# Patient Record
Sex: Male | Born: 1945 | Race: White | Hispanic: No | Marital: Married | Smoking: Never smoker
Health system: Southern US, Community
[De-identification: ages and names within clinical notes are randomized; demographics above are authoritative.]

## PROBLEM LIST (undated history)

## (undated) DIAGNOSIS — F329 Major depressive disorder, single episode, unspecified: Secondary | ICD-10-CM

## (undated) DIAGNOSIS — Z87898 Personal history of other specified conditions: Secondary | ICD-10-CM

## (undated) DIAGNOSIS — K219 Gastro-esophageal reflux disease without esophagitis: Secondary | ICD-10-CM

## (undated) DIAGNOSIS — F32A Depression, unspecified: Secondary | ICD-10-CM

## (undated) DIAGNOSIS — L02415 Cutaneous abscess of right lower limb: Secondary | ICD-10-CM

## (undated) DIAGNOSIS — Z862 Personal history of diseases of the blood and blood-forming organs and certain disorders involving the immune mechanism: Secondary | ICD-10-CM

## (undated) DIAGNOSIS — I10 Essential (primary) hypertension: Secondary | ICD-10-CM

## (undated) DIAGNOSIS — Z8619 Personal history of other infectious and parasitic diseases: Secondary | ICD-10-CM

## (undated) DIAGNOSIS — K259 Gastric ulcer, unspecified as acute or chronic, without hemorrhage or perforation: Secondary | ICD-10-CM

## (undated) DIAGNOSIS — M549 Dorsalgia, unspecified: Secondary | ICD-10-CM

## (undated) DIAGNOSIS — M7700 Medial epicondylitis, unspecified elbow: Secondary | ICD-10-CM

## (undated) DIAGNOSIS — M4800 Spinal stenosis, site unspecified: Secondary | ICD-10-CM

## (undated) DIAGNOSIS — G629 Polyneuropathy, unspecified: Secondary | ICD-10-CM

## (undated) DIAGNOSIS — N200 Calculus of kidney: Secondary | ICD-10-CM

## (undated) DIAGNOSIS — Z87442 Personal history of urinary calculi: Secondary | ICD-10-CM

## (undated) DIAGNOSIS — R519 Headache, unspecified: Secondary | ICD-10-CM

## (undated) DIAGNOSIS — E785 Hyperlipidemia, unspecified: Secondary | ICD-10-CM

## (undated) DIAGNOSIS — M1990 Unspecified osteoarthritis, unspecified site: Secondary | ICD-10-CM

## (undated) DIAGNOSIS — G8929 Other chronic pain: Secondary | ICD-10-CM

## (undated) DIAGNOSIS — C4490 Unspecified malignant neoplasm of skin, unspecified: Secondary | ICD-10-CM

## (undated) DIAGNOSIS — M87051 Idiopathic aseptic necrosis of right femur: Secondary | ICD-10-CM

## (undated) HISTORY — PX: APPENDECTOMY: SHX54

## (undated) HISTORY — PX: MULTIPLE TOOTH EXTRACTIONS: SHX2053

## (undated) HISTORY — PX: CHOLECYSTECTOMY: SHX55

## (undated) HISTORY — PX: HEMORROIDECTOMY: SUR656

---
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
## 2007-05-07 ENCOUNTER — Ambulatory Visit: Payer: Self-pay | Admitting: Internal Medicine

## 2007-05-31 ENCOUNTER — Ambulatory Visit: Payer: Self-pay

## 2007-05-31 ENCOUNTER — Encounter: Payer: Self-pay | Admitting: Internal Medicine

## 2007-06-04 ENCOUNTER — Ambulatory Visit: Payer: Self-pay | Admitting: Internal Medicine

## 2007-06-04 LAB — CONVERTED CEMR LAB
AST: 32 units/L (ref 0–37)
Albumin: 4 g/dL (ref 3.5–5.2)
Bilirubin, Direct: 0.1 mg/dL (ref 0.0–0.3)
Cholesterol: 182 mg/dL (ref 0–200)
GFR calc Af Amer: 72 mL/min
GFR calc non Af Amer: 59 mL/min
Glucose, Bld: 96 mg/dL (ref 70–99)
HDL: 41.1 mg/dL (ref >39.0)
LDL Cholesterol: 111 mg/dL — ABNORMAL HIGH (ref 0–99)
Potassium: 4.5 meq/L (ref 3.5–5.1)
Sodium: 142 meq/L (ref 135–145)
Total CHOL/HDL Ratio: 4.4
Triglycerides: 150 mg/dL — ABNORMAL HIGH (ref 0–149)
VLDL: 30 mg/dL (ref 0–40)

## 2007-10-11 DIAGNOSIS — I2510 Atherosclerotic heart disease of native coronary artery without angina pectoris: Secondary | ICD-10-CM

## 2007-10-11 HISTORY — DX: Atherosclerotic heart disease of native coronary artery without angina pectoris: I25.10

## 2010-05-14 ENCOUNTER — Encounter: Payer: Self-pay | Admitting: Cardiology

## 2010-05-14 ENCOUNTER — Ambulatory Visit: Payer: Self-pay | Admitting: Cardiology

## 2010-05-24 DIAGNOSIS — F1010 Alcohol abuse, uncomplicated: Secondary | ICD-10-CM | POA: Insufficient documentation

## 2010-05-24 DIAGNOSIS — R55 Syncope and collapse: Secondary | ICD-10-CM | POA: Insufficient documentation

## 2010-05-24 DIAGNOSIS — R0989 Other specified symptoms and signs involving the circulatory and respiratory systems: Secondary | ICD-10-CM

## 2010-05-24 DIAGNOSIS — R0602 Shortness of breath: Secondary | ICD-10-CM

## 2010-05-24 DIAGNOSIS — R0609 Other forms of dyspnea: Secondary | ICD-10-CM

## 2010-05-25 ENCOUNTER — Ambulatory Visit: Payer: Self-pay | Admitting: Cardiology

## 2010-05-25 ENCOUNTER — Encounter (INDEPENDENT_AMBULATORY_CARE_PROVIDER_SITE_OTHER): Payer: Self-pay | Admitting: *Deleted

## 2010-05-27 ENCOUNTER — Encounter: Payer: Self-pay | Admitting: Cardiology

## 2010-06-02 ENCOUNTER — Encounter: Payer: Self-pay | Admitting: Cardiology

## 2010-06-02 ENCOUNTER — Ambulatory Visit: Payer: Self-pay | Admitting: Cardiology

## 2010-06-03 ENCOUNTER — Encounter: Payer: Self-pay | Admitting: Cardiology

## 2010-06-04 ENCOUNTER — Telehealth (INDEPENDENT_AMBULATORY_CARE_PROVIDER_SITE_OTHER): Payer: Self-pay | Admitting: *Deleted

## 2010-06-29 ENCOUNTER — Ambulatory Visit: Payer: Self-pay | Admitting: Cardiology

## 2010-11-09 NOTE — Assessment & Plan Note (Signed)
Summary: 1 MO F/U PER 8/16 OV-JM   Visit Type:  Follow-up Primary Hamza Empson:  [name] Agee, NP  CC:  near-syncope.  History of Present Illness: The patient presents for followup of dyspnea and presyncope. In the last visit I gave him some meclizine thinking that he was having some vertigo. In addition he had a BNP level which was normal. An event monitor demonstrated sinus rhythm. Stress testing demonstrated no evidence of ischemia or infarct. Following all this he took it upon himself to stop his Flomax, meclizine, Trilipix and hydrochlorothiazide. He said that since that time his symptoms have completely resolved. He is no longer having any presyncope or syncope. Is not having any palpitations. He said the dyspnea. He denies any PND or orthopnea. He said no chest discomfort.  Preventive Screening-Counseling & Management  Alcohol-Tobacco     Smoking Status: never  Current Medications (verified): 1)  Lotrel 5-20 Mg Caps (Amlodipine Besy-Benazepril Hcl) .... Take 1 Tablet By Mouth Once A Day 2)  Vitamin D (Ergocalciferol) 50000 Unit Caps (Ergocalciferol) .... Take One Tab Weekly 3)  Alprazolam 0.5 Mg Tabs (Alprazolam) .... Take 1 Tab By Mouth At Bedtime As Needed  Allergies (verified): No Known Drug Allergies  Comments:  Nurse/Medical Assistant: The patient is currently on medications but does not know the name or dosage at this time. Instructed to contact our office with details. Will update medication list at that time.  Past History:  Past Medical History: Reviewed history from 05/25/2010 and no changes required. Obesity.  Hypertension x years Alcohol abuse.  Heavy snoring.  Hyperlipidemia  Past Surgical History: Reviewed history from 05/25/2010 and no changes required. Appendectomy Hemorrhoidectomy  Review of Systems       As stated in the HPI and negative for all other systems.   Vital Signs:  Patient profile:   65 year old male Height:      72 inches Weight:       246 pounds Pulse rate:   105 / minute BP sitting:   144 / 91  (left arm)  Vitals Entered By: Carlye Grippe (June 29, 2010 12:50 PM)  Physical Exam  General:  Well developed, well nourished, in no acute distress. Head:  normocephalic and atraumatic Neck:  Neck supple, no JVD. No masses, thyromegaly or abnormal cervical nodes. Chest Wall:  no deformities or breast masses noted Lungs:  Clear bilaterally to auscultation and percussion. Heart:  Non-displaced PMI, chest non-tender; regular rate and rhythm, S1, S2 without murmurs, rubs or gallops. Carotid upstroke normal, no bruit. Normal abdominal aortic size, no bruits. Femorals normal pulses, no bruits. Pedals normal pulses. No edema, no varicosities. Abdomen:  Bowel sounds positive; abdomen soft and non-tender without masses, organomegaly, or hernias noted. No hepatosplenomegaly, obese Msk:  Back normal, normal gait. Muscle strength and tone normal. Extremities:  No clubbing or cyanosis. Neurologic:  Alert and oriented x 3. Skin:  Intact without lesions or rashes. Cervical Nodes:  no significant adenopathy Psych:  Normal affect.   Impression & Recommendations:  Problem # 1:  SYNCOPE AND COLLAPSE (ICD-780.2) The etiology of this is not clear but it has resolved. It may have been related to medications. No further cardiovascular testing is planned.  Problem # 2:  SHORTNESS OF BREATH (ICD-786.05) His dyspnea has improved. Testing was negative. If this returns I would suggest followup with his primary care perhaps pulmonary. Certainly he needs to change lifestyles as described in the previous note. However, at this time no further cardiovascular testing is  suggested.  Patient Instructions: 1)  No further cardiac work up needed.

## 2010-11-09 NOTE — Letter (Signed)
Summary: Pharmacist, community at [facility] [address]   [place], [place] [address]   Phone: [phone]  Fax: [phone]      [facility] Cardiovascular Services  Cardiolite Stress Test     [name] [facility]  Your doctor has ordered a Cardiolite Stress Test to help determine the condition of your heart during stress. If you take blood pressure medicine, ask your doctor if you should take it the day of your test. You should not have anything to eat or drink at least 4 hours before your test is scheduled, and no caffeine (coffee, tea, decaf. or chocolate) for 24 hours before your test.  You will need to register at the Outpatient/Main Entrance at the hospital 30 minutes before your appointment time. It is a good idea to bring a copy of your order with you. They will direct you to the Diagnostic Imaging (Radiology) Department.  You will be asked to undress from the waist up and be given a hospital gown to wear, so dress comfortably from the waist down, for example:    Sweat pants, shorts or skirt   Rubber-soled lace up shoes (i.e. tennis shoes)  Plan on about three hours from registration to release from the hospital.    Date of Test:              Time of Test              You may take all of your medications with water the morning of your test.

## 2010-11-09 NOTE — Procedures (Signed)
Summary: Holter and Event/ [facility] SERVICES  Holter and Event/ [facility] SERVICES   Imported By: Dorise Hiss 06/01/2010 08:35:22  _____________________________________________________________________  External Attachment:    Type:   Image     Comment:   External Document

## 2010-11-09 NOTE — Progress Notes (Signed)
Summary: STRESS TEST RESULTS  Phone Note Call from Patient Call back at [phone]   Reason for Call: Lab or Test Results Summary of Call: WOULD LIKE STRESS TEST RESULTS FROM 06/02/10 Initial call taken by: Dorise Hiss,  June 04, 2010 10:36 AM  Follow-up for Phone Call        Spoke with Lucendia Herrlich and she informed nurse that results hasn't been reviewed by MD yet,and results will be available at that time once reviewed by md next week. Follow-up by: Carlye Grippe,  June 04, 2010 4:13 PM  Additional Follow-up for Phone Call Additional follow up Details #1::        Left message to call back on machine. Cyril Loosen, RN, BSN  June 08, 2010 9:35 AM  Pt notified of results and verbalized understanding.  Cyril Loosen, RN, BSN  June 08, 2010 10:27 AM  Additional Follow-up by: Cyril Loosen, RN, BSN,  June 08, 2010 10:27 AM

## 2010-11-09 NOTE — Assessment & Plan Note (Signed)
Summary: ec6   Visit Type:  Initial Consult Primary Provider:  Belva Agee, NP  CC:  dizziness.  History of Present Illness: The patient presents for evaluation of dizziness. He has had this complaint for some years and was actually seen by [place] over 3 years ago. At that time he had an echocardiogram which was normal. It was not felt that his dizziness had a cardiac etiology. Since that time this has continued. He's dizzy with any change in position such as going from bending standing up but it also happens when he goes from sitting to lying down. His room begins to span. He does notice his heart rate going up which happens when he changes from my bent to an upright position. He is wearing an event monitor currently and sought recently go up to about 170 when he stood up from a bent position. He's also had episodes with his blood pressure dropping with one episode recorded at 60/40. He's had no frank syncope. He doesn't exercise but he does try to do some activities. He says he's been having increasing shortness of breath with mild activities and tiredness. He also has some cramping back pain that happens between his shoulder blades. He has not had chest pressure or neck discomfort. He doesn't have PND or orthopnea.  Preventive Screening-Counseling & Management  Alcohol-Tobacco     Smoking Status: never  Current Medications (verified): 1)  Lotrel 5-20 Mg Caps (Amlodipine Besy-Benazepril Hcl) .... Take 1 Tablet By Mouth Once A Day 2)  Trilipix 135 Mg Cpdr (Choline Fenofibrate) .... Take 1 Tablet By Mouth Once A Day 3)  Flomax 0.4 Mg Caps (Tamsulosin Hcl) .... Take 1 Tablet By Mouth Once A Day 4)  Vitamin D (Ergocalciferol) 50000 Unit Caps (Ergocalciferol) .... Take One Tab Weekly 5)  Hydrochlorothiazide 25 Mg Tabs (Hydrochlorothiazide) .... Take One Tablet By Mouth Daily. 6)  Alprazolam 0.5 Mg Tabs (Alprazolam) .... Take 1 Tab By Mouth At Bedtime As Needed  Allergies (verified): No Known Drug  Allergies  Past History:  Past Medical History: Obesity.  Hypertension x years Alcohol abuse.  Heavy snoring.  Hyperlipidemia  Past Surgical History: Appendectomy Hemorrhoidectomy  Family History:  Father died at 108 due to old age.  Mother died at 68   due to cancer.  He has a sister who is alive and well at 90 and a   brother who is alive at 6.  There is no family history of premature   coronary artery disease.   Social History: Retired -former Soil scientist Alcohol Use - yes Married with one child Never smoked Smoking Status:  never  Review of Systems       As stated in the HPI and negative for all other systems.   Vital Signs:  Patient profile:   65 year old male Height:      72 inches Weight:      242.75 pounds BMI:     33.04 Pulse rate:   103 / minute Pulse (ortho):   111 / minute BP sitting:   124 / 82  (left arm) BP standing:   94 / 61 Cuff size:   regular  Vitals Entered By: Hoover Brunette, LPN (May 25, 2010 1:27 PM)  Nutrition Counseling: Patient's BMI is greater than 25 and therefore counseled on weight management options.  Serial Vital Signs/Assessments:  Time      Position  BP       Pulse  Resp  Temp     By 2:23  PM   Lying RA  119/77   105                   Cyril Loosen, RN, BSN 2:23 PM   Sitting   116/74   109                   Cyril Loosen, RN, BSN 2:23 PM   Standing  94/61    111                   Cyril Loosen, Charity fundraiser, BSN  Comments: 2:23 PM 2 mins Standing BP: 118/81, HR 111 5 mins Standing BP: 104/72, HR 111 By: Cyril Loosen, RN, BSN   CC: dizziness Is Patient Diabetic? No Comments dizziness, worse with bending over x 2 weeks approx.     Physical Exam  General:  Well developed, well nourished, in no acute distress. Head:  normocephalic and atraumatic Eyes:  PERRLA/EOM intact; conjunctiva and lids normal. Mouth:  Teeth, gums and palate normal. Oral mucosa normal. Neck:  Neck supple, no JVD. No masses, thyromegaly or abnormal  cervical nodes. Chest Wall:  no deformities or breast masses noted Lungs:  Clear bilaterally to auscultation and percussion. Abdomen:  Bowel sounds positive; abdomen soft and non-tender without masses, organomegaly, or hernias noted. No hepatosplenomegaly. Msk:  Back normal, normal gait. Muscle strength and tone normal. Extremities:  No clubbing or cyanosis. Neurologic:  Alert and oriented x 3. Skin:  Intact without lesions or rashes. Cervical Nodes:  no significant adenopathy Axillary Nodes:  no significant adenopathy Inguinal Nodes:  no significant adenopathy Psych:  Normal affect.   Detailed Cardiovascular Exam  Neck    Carotids: Carotids full and equal bilaterally without bruits.      Neck Veins: Normal, no JVD.    Heart    Inspection: no deformities or lifts noted.      Palpation: normal PMI with no thrills palpable.      Auscultation: regular rate and rhythm, S1, S2 without murmurs, rubs, gallops, or clicks.    Vascular    Abdominal Aorta: no palpable masses, pulsations, or audible bruits.      Femoral Pulses: normal femoral pulses bilaterally.      Pedal Pulses: normal pedal pulses bilaterally.      Radial Pulses: normal radial pulses bilaterally.      Peripheral Circulation: no clubbing, cyanosis, or edema noted with normal capillary refill.     Impression & Recommendations:  Problem # 1:  SYNCOPE AND COLLAPSE (ICD-780.2) He was not orthostatic today. Symptoms were reproducible lying him flat. I suspect this is more a labyrinthitis or vertiginous type of dizziness though there could at times be some orthostasis. He is wearing and then monitor and will continue to wear this. I will try to get these strips. I will prescribe meclizine to see if this helps. Referral to an ENT might be appropriate.  Problem # 2:  SHORTNESS OF BREATH (ICD-786.05)  The patient has dyspnea. I will start with an echocardiogram and BNP level. I'll have a low threshold for stress  testing.  Orders: T-BNP  (B Natriuretic Peptide) ([id number]) Nuclear Med (Nuc Med)  Problem # 3:  ALCOHOL ABUSE (ICD-305.00) He understands the need to stop drinking alcohol which may contribute to symptoms.  Patient Instructions: 1)  Follow up with Dr. Antoine Poche on Tuesday, Sept 20, 2011 at 1pm. 2)  Take Meclizine 25mg  by mouth two times a day as needed dizziness. 3)  Your physician recommends  that you go to the [facility] for lab work. 4)  Your physician has requested that you have an echocardiogram.  Echocardiography is a painless test that uses sound waves to create images of your heart. It provides your doctor with information about the size and shape of your heart and how well your heart's chambers and valves are working.  This procedure takes approximately one hour. There are no restrictions for this procedure. Prescriptions: MECLIZINE HCL 25 MG TABS (MECLIZINE HCL) Take 1 tablet by mouth two times a day as needed  #60 x 1   Entered by:   Cyril Loosen, RN, BSN   Authorized by:   Rollene Rotunda, MD, [facility]   Signed by:   Cyril Loosen, RN, BSN on 05/25/2010   Method used:   Electronically to        [facility]  [address] (retail)       [address]       [place], [place]  [address]       Ph: [phone]       Fax: [phone]   RxID:   [id number]  Per Dr. Antoine Poche, we found a normal Echo. Pt will now have stress cardiolite ordered. Pt would like to know about returning to work. Pt will f/u with primary MD for clearance to return to work d/t dizziness. Cyril Loosen, RN, BSN  May 25, 2010 3:04 PM

## 2011-02-22 NOTE — Assessment & Plan Note (Signed)
[facility]                            CARDIOLOGY OFFICE NOTE   NAME:[name], [name]                       MRN:          [id number]  DATE:05/07/2007                            DOB:          26-Jul-1946    REFERRING PHYSICIAN:  Delaney [name], M.D.   REASON FOR CONSULTATION:  Shortness of breath and dizziness.   HISTORY OF PRESENT ILLNESS:  [name] is a pleasant 65 year old male  with a history of hypertension, obesity and alcohol abuse.  He denies  any history of known coronary artery disease.  He did say about 10 years  ago he had a stress test with [name] [name] in [place] for dyspnea.  Apparently he has been dizzy and weak for about a month.  He says every  time he stands or changes positions quickly, he gets quite dizzy.  He  feels like the room is spinning and often feels sick to his stomach.  If  he lays perfectly still and closes his eyes, it often is much better.  This sensation has been decreasing over the past week.  He also notes  that he has been short of breath for the past 25 years and that appears  to be getting more prominent.  He denies any orthopnea or PND.  He has  not had any chest pain.  His wife does say he snores quite heavily, but  she has not seen any witnessed apnea.   He denies any history of tobacco use.  I did ask him about his alcohol  intake and he seemed to minimize this.  When I asked him directly, he  said yesterday he drank a case of beer throughout the day and on  Saturday he had more than a fifth of Vodka by himself to celebrate his  birthday.  But he says he can go days sometimes without drinking.   REVIEW OF SYSTEMS:  He feels cold on the bottom of his feet.  He denies  any focal weakness, no slurred speech, no bright red blood per rectum.  No melena.  No fevers or chills.  The remainder of the review of systems  is negative except for HPI and problem list.   PROBLEM LIST:  1. Obesity.  2. High blood  pressure.  3. Alcohol abuse.  4. Heavy snoring.   CURRENT MEDICATIONS:  Hydrochlorothiazide 25 daily.   DRUG ALLERGIES:  No known drug allergies.   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  Alcohol as described above.  He is a nonsmoker.   FAMILY HISTORY:  Father died at 70 due to old age.  Mother died at 23  due to cancer.  He has a sister who is alive and well at 73 and a  brother who is alive at 20.  There is no family history of premature  coronary artery disease.   PHYSICAL EXAMINATION:  He is in no acute distress.  He ambulates around  the clinic without any respiratory difficulty.  Blood pressure is 152/94, heart rate is  81, weight is 249.  HEENT:  Normal.  NECK:  Supple.  There is no JVD.  Carotids are 2+ bilaterally without  any bruits.  There is no lymphadenopathy or thyromegaly.  CARDIAC:  PMI is nonpalpable.  There is a regular rate and rhythm with  an S4.  No murmur.  LUNGS:  Clear.  ABDOMEN:  Obese, nontender, nondistended.  No obvious  hepatosplenomegaly.  No bruits, no masses.  Good bowel sounds.  EXTREMITIES:  Warm with no cyanosis, clubbing or edema.  Distal pulses  are 1+ bilaterally. There is no rash.  NEURO:  He is alert and oriented x3.  Cranial nerves II through XII are  intact.  He has no pronator drift.  Finger to nose examination is  intact.  Strength is normal throughout all extremities.  Affect is  pleasant.   EKG shows normal sinus rhythm at a rate of 81 with an incomplete right  bundle branch block.  No significant ST-T wave abnormalities.   ASSESSMENT:  1. Dizziness.  I suspect this is vertigo.  We will start him on      meclizine and see if he has symptomatic improvement.  If not and he      continues to have symptoms, he may warrant an evaluation by      neurology and possible head CT.  2. Dyspnea. I suspect this is multifactorial, but he does have      numerous risk factors for coronary artery  disease.  We will get an      echocardiogram as well as an adenosine Myoview to further evaluate.  3. Hypertension.  This is suboptimally controlled. We will change his      hydrochlorothiazide to lisinopril 20 and hydrochlorothiazide 25      combination tablet.  Check a BMET next week.  4. Alcohol abuse.  I had a long talk with him and his wife about the      fact that he is a binge alcoholic.  I have warned them of the risks      of this.  I question whether or not this may be affecting his      dizziness as well.  We will go ahead and start him on thiamine 100      mg a day and a multivitamin.  I have asked him to stop drinking.   DISPOSITION:  Will be based on the results of his testing.  I will see  him back in clinic in a month or two.     [name] Buckles. Bensimhon, MD  Electronically Signed    DRB/MedQ  DD: 05/07/2007  DT: 05/08/2007  Job #: [id number]   cc:   [name] [name], M.D.

## 2011-12-09 ENCOUNTER — Other Ambulatory Visit: Payer: Self-pay | Admitting: Family Medicine

## 2011-12-09 DIAGNOSIS — M545 Low back pain: Secondary | ICD-10-CM

## 2011-12-13 ENCOUNTER — Other Ambulatory Visit: Payer: Self-pay | Admitting: Family Medicine

## 2011-12-13 ENCOUNTER — Ambulatory Visit
Admission: RE | Admit: 2011-12-13 | Discharge: 2011-12-13 | Disposition: A | Discharge status: Home / Self Care | Payer: Medicare Other | Source: Ambulatory Visit | Attending: Family Medicine | Admitting: Family Medicine

## 2011-12-13 DIAGNOSIS — Z77018 Contact with and (suspected) exposure to other hazardous metals: Secondary | ICD-10-CM

## 2011-12-13 DIAGNOSIS — M545 Low back pain: Secondary | ICD-10-CM

## 2011-12-14 ENCOUNTER — Other Ambulatory Visit: Payer: Self-pay

## 2012-04-11 ENCOUNTER — Inpatient Hospital Stay (HOSPITAL_COMMUNITY)
Admission: AD | Admit: 2012-04-11 | Discharge: 2012-04-15 | DRG: 603 | Disposition: A | Discharge status: Home / Self Care | Payer: Medicare Other | Source: Ambulatory Visit | Attending: Orthopedic Surgery | Admitting: Orthopedic Surgery

## 2012-04-11 ENCOUNTER — Encounter (HOSPITAL_COMMUNITY): Payer: Self-pay | Admitting: Orthopedic Surgery

## 2012-04-11 ENCOUNTER — Other Ambulatory Visit: Payer: Self-pay | Admitting: Orthopedic Surgery

## 2012-04-11 DIAGNOSIS — Z8249 Family history of ischemic heart disease and other diseases of the circulatory system: Secondary | ICD-10-CM

## 2012-04-11 DIAGNOSIS — N200 Calculus of kidney: Secondary | ICD-10-CM | POA: Diagnosis present

## 2012-04-11 DIAGNOSIS — K259 Gastric ulcer, unspecified as acute or chronic, without hemorrhage or perforation: Secondary | ICD-10-CM | POA: Diagnosis present

## 2012-04-11 DIAGNOSIS — Z79899 Other long term (current) drug therapy: Secondary | ICD-10-CM

## 2012-04-11 DIAGNOSIS — A4902 Methicillin resistant Staphylococcus aureus infection, unspecified site: Secondary | ICD-10-CM | POA: Diagnosis present

## 2012-04-11 DIAGNOSIS — L02419 Cutaneous abscess of limb, unspecified: Principal | ICD-10-CM | POA: Diagnosis present

## 2012-04-11 DIAGNOSIS — F17200 Nicotine dependence, unspecified, uncomplicated: Secondary | ICD-10-CM | POA: Diagnosis present

## 2012-04-11 DIAGNOSIS — I10 Essential (primary) hypertension: Secondary | ICD-10-CM | POA: Diagnosis present

## 2012-04-11 DIAGNOSIS — M7700 Medial epicondylitis, unspecified elbow: Secondary | ICD-10-CM | POA: Diagnosis present

## 2012-04-11 DIAGNOSIS — L02415 Cutaneous abscess of right lower limb: Secondary | ICD-10-CM | POA: Diagnosis present

## 2012-04-11 HISTORY — DX: Cutaneous abscess of right lower limb: L02.415

## 2012-04-11 HISTORY — DX: Gastric ulcer, unspecified as acute or chronic, without hemorrhage or perforation: K25.9

## 2012-04-11 HISTORY — DX: Medial epicondylitis, unspecified elbow: M77.00

## 2012-04-11 HISTORY — DX: Calculus of kidney: N20.0

## 2012-04-11 HISTORY — DX: Essential (primary) hypertension: I10

## 2012-04-11 LAB — BASIC METABOLIC PANEL
BUN: 29 mg/dL — ABNORMAL HIGH (ref 6–23)
Calcium: 10 mg/dL (ref 8.4–10.5)
GFR calc non Af Amer: 39 mL/min — ABNORMAL LOW (ref >90)
Glucose, Bld: 104 mg/dL — ABNORMAL HIGH (ref 70–99)

## 2012-04-11 LAB — DIFFERENTIAL
Basophils Relative: 0 % (ref 0–1)
Eosinophils Absolute: 0.1 10*3/uL (ref 0.0–0.7)
Eosinophils Relative: 1 % (ref 0–5)
Monocytes Relative: 12 % (ref 3–12)
Neutrophils Relative %: 70 % (ref 43–77)

## 2012-04-11 LAB — CBC
Hemoglobin: 13 g/dL (ref 13.0–17.0)
MCH: 33.4 pg (ref 26.0–34.0)
MCHC: 33.2 g/dL (ref 30.0–36.0)
Platelets: 181 10*3/uL (ref 150–400)

## 2012-04-11 MED ORDER — HYDROMORPHONE HCL PF 1 MG/ML IJ SOLN
0.5000 mg | INTRAMUSCULAR | Status: DC | PRN
  Administered 2012-04-11 – 2012-04-14 (×11): 0.5 mg via INTRAVENOUS
  Filled 2012-04-11 (×9): qty 1

## 2012-04-11 MED ORDER — SORBITOL 70 % SOLN: 30.0000 mL | Freq: Every day | Status: DC | PRN

## 2012-04-11 MED ORDER — BISACODYL 5 MG PO TBEC
5.0000 mg | DELAYED_RELEASE_TABLET | Freq: Every day | ORAL | Status: DC
  Administered 2012-04-11 – 2012-04-15 (×3): 5 mg via ORAL
  Filled 2012-04-11 (×3): qty 1

## 2012-04-11 MED ORDER — ENOXAPARIN SODIUM 40 MG/0.4ML ~~LOC~~ SOLN
40.0000 mg | SUBCUTANEOUS | Status: DC
  Administered 2012-04-11 – 2012-04-13 (×3): 40 mg via SUBCUTANEOUS
  Filled 2012-04-11 (×4): qty 0.4

## 2012-04-11 MED ORDER — ALUM & MAG HYDROXIDE-SIMETH 200-200-20 MG/5ML PO SUSP: 30.0000 mL | Freq: Four times a day (QID) | ORAL | Status: DC | PRN

## 2012-04-11 MED ORDER — VANCOMYCIN HCL 1000 MG IV SOLR
1500.0000 mg | INTRAVENOUS | Status: DC
  Administered 2012-04-11 – 2012-04-12 (×2): 1500 mg via INTRAVENOUS
  Filled 2012-04-11 (×3): qty 1500

## 2012-04-11 MED ORDER — OXYCODONE HCL 5 MG PO TABS
5.0000 mg | ORAL_TABLET | ORAL | Status: DC | PRN
  Administered 2012-04-11 – 2012-04-14 (×11): 5 mg via ORAL
  Filled 2012-04-11 (×4): qty 1
  Filled 2012-04-11: qty 2
  Filled 2012-04-11 (×6): qty 1

## 2012-04-11 MED ORDER — POTASSIUM CHLORIDE IN NACL 20-0.9 MEQ/L-% IV SOLN
INTRAVENOUS | Status: DC
  Administered 2012-04-11 – 2012-04-12 (×2): via INTRAVENOUS
  Administered 2012-04-13: 1000 mL via INTRAVENOUS
  Administered 2012-04-14: 01:00:00 via INTRAVENOUS
  Filled 2012-04-11 (×10): qty 1000

## 2012-04-11 MED ORDER — ONDANSETRON HCL 4 MG PO TABS: 4.0000 mg | ORAL_TABLET | Freq: Four times a day (QID) | ORAL | Status: DC | PRN

## 2012-04-11 MED ORDER — CLINDAMYCIN PHOSPHATE 600 MG/50ML IV SOLN
600.0000 mg | Freq: Three times a day (TID) | INTRAVENOUS | Status: DC
  Administered 2012-04-11 – 2012-04-15 (×13): 600 mg via INTRAVENOUS
  Filled 2012-04-11 (×17): qty 50

## 2012-04-11 MED ORDER — ONDANSETRON HCL 4 MG/2ML IJ SOLN: 4.0000 mg | Freq: Four times a day (QID) | INTRAMUSCULAR | Status: DC | PRN

## 2012-04-11 MED ORDER — HYDROCHLOROTHIAZIDE 25 MG PO TABS
12.5000 mg | ORAL_TABLET | Freq: Every day | ORAL | Status: DC
  Filled 2012-04-11 (×2): qty 0.5

## 2012-04-11 MED ORDER — SENNA 8.6 MG PO TABS
1.0000 | ORAL_TABLET | Freq: Two times a day (BID) | ORAL | Status: DC
  Administered 2012-04-11 – 2012-04-13 (×6): 8.6 mg via ORAL
  Filled 2012-04-11 (×8): qty 1

## 2012-04-11 MED ORDER — ERGOCALCIFEROL 1.25 MG (50000 UT) PO CAPS
50000.0000 [IU] | ORAL_CAPSULE | ORAL | Status: DC
  Filled 2012-04-11: qty 1

## 2012-04-11 MED ORDER — DOCUSATE SODIUM 100 MG PO CAPS
100.0000 mg | ORAL_CAPSULE | Freq: Two times a day (BID) | ORAL | Status: DC
  Administered 2012-04-11 – 2012-04-13 (×6): 100 mg via ORAL
  Filled 2012-04-11 (×8): qty 1

## 2012-04-11 MED ORDER — BENAZEPRIL HCL 20 MG PO TABS
20.0000 mg | ORAL_TABLET | Freq: Every day | ORAL | Status: DC
  Administered 2012-04-12 – 2012-04-15 (×3): 20 mg via ORAL
  Filled 2012-04-11 (×5): qty 1

## 2012-04-11 MED ORDER — ALPRAZOLAM 0.5 MG PO TABS
0.5000 mg | ORAL_TABLET | Freq: Two times a day (BID) | ORAL | Status: DC | PRN
  Administered 2012-04-11 – 2012-04-14 (×4): 0.5 mg via ORAL
  Filled 2012-04-11 (×4): qty 1

## 2012-04-11 MED ORDER — HYDROCHLOROTHIAZIDE 12.5 MG PO CAPS
12.5000 mg | ORAL_CAPSULE | Freq: Every day | ORAL | Status: DC
  Administered 2012-04-12 – 2012-04-15 (×3): 12.5 mg via ORAL
  Filled 2012-04-11 (×5): qty 1

## 2012-04-11 MED ORDER — VITAMIN D (ERGOCALCIFEROL) 1.25 MG (50000 UNIT) PO CAPS
50000.0000 [IU] | ORAL_CAPSULE | ORAL | Status: DC
  Administered 2012-04-12: 50000 [IU] via ORAL
  Filled 2012-04-11 (×2): qty 1

## 2012-04-11 MED ORDER — AMLODIPINE BESYLATE 5 MG PO TABS
5.0000 mg | ORAL_TABLET | Freq: Every day | ORAL | Status: DC
  Administered 2012-04-12 – 2012-04-15 (×3): 5 mg via ORAL
  Filled 2012-04-11 (×5): qty 1

## 2012-04-11 NOTE — Progress Notes (Signed)
ANTIBIOTIC CONSULT NOTE - INITIAL  Pharmacy Consult for Vancomycin Indication: Infection right hip, superficial   No Known Allergies  Patient Measurements: Height: 6' (182.9 cm) Weight: 220 lb (99.791 kg) (per patient) IBW/kg (Calculated) : 77.6    Vital Signs: Temp: 98.4 F (36.9 C) (07/03 1300) Temp src: Oral (07/03 1300) BP: 103/66 mmHg (07/03 1300) Pulse Rate: 99  (07/03 1300) Intake/Output from previous day:   Intake/Output from this shift:    Labs:  Basename 04/11/12 1311  WBC 13.0*  HGB 13.0  PLT 181  LABCREA --  CREATININE 1.74*   Estimated Creatinine Clearance: 51.1 ml/min (by C-G formula based on Cr of 1.74). No results found for this basename: VANCOTROUGH:2,VANCOPEAK:2,VANCORANDOM:2,GENTTROUGH:2,GENTPEAK:2,GENTRANDOM:2,TOBRATROUGH:2,TOBRAPEAK:2,TOBRARND:2,AMIKACINPEAK:2,AMIKACINTROU:2,AMIKACIN:2, in the last 72 hours   Microbiology: No results found for this or any previous visit (from the past 720 hour(s)).  Medical History: No past medical history on file.  Medications:  Prescriptions prior to admission  Medication Sig Dispense Refill  . ALPRAZolam (XANAX) 0.5 MG tablet Take 0.5 mg by mouth 2 (two) times daily as needed. Anxiety      . amLODipine (NORVASC) 5 MG tablet Take 5 mg by mouth daily.      . benazepril (LOTENSIN) 20 MG tablet Take 20 mg by mouth daily.      . bisacodyl (DULCOLAX) 5 MG EC tablet Take 5 mg by mouth daily.      . ergocalciferol (VITAMIN D2) 50000 UNITS capsule Take 50,000 Units by mouth 2 (two) times a week.      . hydrochlorothiazide (HYDRODIURIL) 12.5 MG tablet Take 12.5 mg by mouth daily.      Marland Kitchen oxyCODONE-acetaminophen (PERCOCET) 10-325 MG per tablet Take 1-2 tablets by mouth every 6 (six) hours as needed. For pain       Scheduled:    . clindamycin (CLEOCIN) IV  600 mg Intravenous Q8H  . docusate sodium  100 mg Oral BID  . enoxaparin  40 mg Subcutaneous Q24H  . senna  1 tablet Oral BID  . vancomycin  1,500 mg  Intravenous Q24H   Assessment: 66 yo male admitted today with right hip superficial infection to start on IV Vancomycin per pharmacy protocol and Clindamycin 600mg  IV q8h.  SCr 1.74, estimated CrCl ~ 51 ml/min.   Goal of Therapy:  Vancomycin trough level 10-15 mcg/ml  Plan:  Vancomycin 1500mg  IV q24h. Monitor renal function, cultures and clinical status.  Steady state vancomycin level if continued > 5days or if renal function decreases.    Noah Delaine, RPh Clinical Pharmacist  04/11/2012, 14:43PM

## 2012-04-11 NOTE — H&P (Signed)
ADMISSION H&P  Chief Complaint: Right hip superficial infection  HPI: [name] [name] is a 66 y.o. male who was seen in the office today complaining of increasing redness, warmth, and severe pain in the right groin.  He has hip arthritis, and was scheduled for an injection today, but this was cancelled due to the new complaints.  This is different than his hip arthritis.  He was working in the yard over the past weekend, and thinks he might have scratched his inguinal region in his sleep, and then over the past couple of days developed increasing redness, fevers and chills, and worsening pain.  Aspiration of the superficial indurated area was performed in the office today by myself and sent to the lab for GS/C&S.  Past Medical History  Diagnosis Date  . Abscess of right hip 04/11/2012  . Nephrolithiasis   . Medial epicondylitis   . Gastric ulcer    No past surgical history on file. History   Social History  . Marital Status: Married    Spouse Name: N/A    Number of Children: N/A  . Years of Education: N/A   Social History Main Topics  . Smoking status: Current Everyday Smoker -- 0.5 packs/day    Types: Cigarettes  . Smokeless tobacco: Not on file  . Alcohol Use: Not on file  . Drug Use: Not on file  . Sexually Active: Not on file   Other Topics Concern  . Not on file   Social History Narrative  . No narrative on file   Family History  Problem Relation Age of Onset  . Heart disease Father    No Known Allergies Prior to Admission medications   Medication Sig Start Date End Date Taking? Authorizing Provider  ALPRAZolam Prudy Feeler) 0.5 MG tablet Take 0.5 mg by mouth 2 (two) times daily as needed. Anxiety   Yes Historical Provider, MD  amLODipine (NORVASC) 5 MG tablet Take 5 mg by mouth daily.   Yes Historical Provider, MD  benazepril (LOTENSIN) 20 MG tablet Take 20 mg by mouth daily.   Yes Historical Provider, MD  bisacodyl (DULCOLAX) 5 MG EC tablet Take 5 mg by mouth daily.    Yes Historical Provider, MD  ergocalciferol (VITAMIN D2) 50000 UNITS capsule Take 50,000 Units by mouth 2 (two) times a week.   Yes Historical Provider, MD  hydrochlorothiazide (HYDRODIURIL) 12.5 MG tablet Take 12.5 mg by mouth daily.   Yes Historical Provider, MD  oxyCODONE-acetaminophen (PERCOCET) 10-325 MG per tablet Take 1-2 tablets by mouth every 6 (six) hours as needed. For pain   Yes Historical Provider, MD     Positive ROS: All other systems have been reviewed and were otherwise negative with the exception of those mentioned in the HPI and as above.  Physical Exam: General: Alert, no acute distress Cardiovascular: No pedal edema Respiratory: No cyanosis, no use of accessory musculature GI: No organomegaly, abdomen is soft and non-tender Skin: He has an abrasion in the right inguinal fold. Neurologic: Sensation intact distally Psychiatric: Patient is competent for consent with normal mood and affect Lymphatic: positive inguinal lymphadenopathy  MUSCULOSKELETAL: right groin has a 3x4 cm indurated area with significant surrounding cellulitis.  CBC    Component Value Date/Time   WBC 13.0* 04/11/2012 1311   RBC 3.89* 04/11/2012 1311   HGB 13.0 04/11/2012 1311   HCT 39.1 04/11/2012 1311   PLT 181 04/11/2012 1311   MCV 100.5* 04/11/2012 1311   MCH 33.4 04/11/2012 1311   MCHC 33.2  04/11/2012 1311   RDW 12.6 04/11/2012 1311   LYMPHSABS 2.2 04/11/2012 1311   MONOABS 1.6* 04/11/2012 1311   EOSABS 0.1 04/11/2012 1311   BASOSABS 0.0 04/11/2012 1311     Assessment: Right inguinal infection, possible abscess, underlying hip arthritis  Plan: Plan for admission and IV antibiotics with warm compresses, and await culture results and clinical response.  The cultures were sent from the office and will likely be found in the [facility] system.  Will check labs and monitor clinical response.  This is a severe problem with high risk of progression given the rapid onset and substantial induration.  If no  improvement, will likely need MRI or CT to eval fluid collection.      Luismiguel Lamere P, MD Cell [phone] Pager [phone]  04/11/2012 7:12 PM

## 2012-04-12 ENCOUNTER — Encounter (HOSPITAL_COMMUNITY): Payer: Self-pay | Admitting: *Deleted

## 2012-04-12 ENCOUNTER — Inpatient Hospital Stay (HOSPITAL_COMMUNITY): Payer: Medicare Other

## 2012-04-12 LAB — CBC
HCT: 36.4 % — ABNORMAL LOW (ref 39.0–52.0)
MCH: 33.2 pg (ref 26.0–34.0)
MCHC: 33.2 g/dL (ref 30.0–36.0)
RDW: 12.4 % (ref 11.5–15.5)

## 2012-04-12 LAB — C-REACTIVE PROTEIN: CRP: 15.32 mg/dL — ABNORMAL HIGH (ref <0.60)

## 2012-04-12 MED ORDER — CHLORHEXIDINE GLUCONATE CLOTH 2 % EX PADS
6.0000 | MEDICATED_PAD | Freq: Every day | CUTANEOUS | Status: DC
  Administered 2012-04-14 – 2012-04-15 (×2): 6 via TOPICAL

## 2012-04-12 MED ORDER — IOHEXOL 300 MG/ML  SOLN
80.0000 mL | Freq: Once | INTRAMUSCULAR | Status: AC | PRN
  Administered 2012-04-12: 80 mL via INTRAVENOUS

## 2012-04-12 MED ORDER — SODIUM CHLORIDE 0.45 % IV BOLUS
1000.0000 mL | Freq: Once | INTRAVENOUS | Status: AC
  Administered 2012-04-12: 1000 mL via INTRAVENOUS

## 2012-04-12 MED ORDER — MUPIROCIN 2 % EX OINT
1.0000 "application " | TOPICAL_OINTMENT | Freq: Two times a day (BID) | CUTANEOUS | Status: DC
  Administered 2012-04-12 – 2012-04-15 (×7): 1 via NASAL
  Filled 2012-04-12: qty 22

## 2012-04-12 NOTE — Progress Notes (Signed)
Subjective:     Patient reports pain as moderate.    Objective: Vital signs in last 24 hours: Temp:  [97.7 F (36.5 C)-98.7 F (37.1 C)] 97.7 F (36.5 C) (07/04 0615) Pulse Rate:  [96-99] 96  (07/04 0615) Resp:  [18] 18  (07/04 0615) BP: (103-124)/(66-86) 124/86 mmHg (07/04 0615) SpO2:  [93 %-95 %] 95 % (07/04 0615) Weight:  [99.791 kg (220 lb)] 99.791 kg (220 lb) (07/03 1300)  Intake/Output from previous day:   Intake/Output this shift: Total I/O In: 120 [P.O.:120] Out: -    Basename 04/12/12 0430 04/11/12 1311  HGB 12.1* 13.0    Basename 04/12/12 0430 04/11/12 1311  WBC 11.9* 13.0*  RBC 3.64* 3.89*  HCT 36.4* 39.1  PLT 183 181    Basename 04/11/12 1311  NA 137  K 4.7  CL 99  CO2 28  BUN 29*  CREATININE 1.74*  GLUCOSE 104*  CALCIUM 10.0   No results found for this basename: LABPT:2,INR:2 in the last 72 hours  Neurovascular intact  Assessment/Plan:    cellulitis hip ?infected inguinal node ,awaits deep culture Marcellius Montagna JR,W D 04/12/2012, 9:36 AM

## 2012-04-13 LAB — BASIC METABOLIC PANEL
Calcium: 8.8 mg/dL (ref 8.4–10.5)
Creatinine, Ser: 1 mg/dL (ref 0.50–1.35)
GFR calc Af Amer: 89 mL/min — ABNORMAL LOW (ref >90)
Sodium: 137 mEq/L (ref 135–145)

## 2012-04-13 MED ORDER — VANCOMYCIN HCL IN DEXTROSE 1-5 GM/200ML-% IV SOLN
1000.0000 mg | Freq: Two times a day (BID) | INTRAVENOUS | Status: DC
  Administered 2012-04-13 – 2012-04-15 (×4): 1000 mg via INTRAVENOUS
  Filled 2012-04-13 (×9): qty 200

## 2012-04-13 NOTE — Progress Notes (Signed)
ANTIBIOTIC CONSULT NOTE - FOLLOW UP  Pharmacy Consult for vancomycin Indication: R hip cellulitis  No Known Allergies  Patient Measurements: Height: 6' (182.9 cm) Weight: 220 lb (99.791 kg) (per patient) IBW/kg (Calculated) : 77.6   Vital Signs: Temp: 98.6 F (37 C) (07/05 0656) BP: 119/92 mmHg (07/05 0656) Pulse Rate: 94  (07/05 0656) Intake/Output from previous day: 07/04 0701 - 07/05 0700 In: 1960 [P.O.:360; I.V.:600; IV Piggyback:1000] Out: 200 [Urine:200] Intake/Output from this shift: Total I/O In: 240 [P.O.:240] Out: -   Labs:  Basename 04/13/12 0600 04/12/12 0430 04/11/12 1311  WBC -- 11.9* 13.0*  HGB -- 12.1* 13.0  PLT -- 183 181  LABCREA -- -- --  CREATININE 1.00 -- 1.74*   Estimated Creatinine Clearance: 88.9 ml/min (by C-G formula based on Cr of 1). No results found for this basename: VANCOTROUGH:2,VANCOPEAK:2,VANCORANDOM:2,GENTTROUGH:2,GENTPEAK:2,GENTRANDOM:2,TOBRATROUGH:2,TOBRAPEAK:2,TOBRARND:2,AMIKACINPEAK:2,AMIKACINTROU:2,AMIKACIN:2, in the last 72 hours   Microbiology: Recent Results (from the past 720 hour(s))  MRSA PCR SCREENING     Status: Abnormal   Collection Time   04/12/12  6:00 AM      Component Value Range Status Comment   MRSA by PCR POSITIVE (*) NEGATIVE Final     Anti-infectives     Start     Dose/Rate Route Frequency Ordered Stop   04/13/12 1300   vancomycin (VANCOCIN) IVPB 1000 mg/200 mL premix        1,000 mg 200 mL/hr over 60 Minutes Intravenous Every 12 hours 04/13/12 1200     04/11/12 1530   vancomycin (VANCOCIN) 1,500 mg in sodium chloride 0.9 % 500 mL IVPB  Status:  Discontinued        1,500 mg 250 mL/hr over 120 Minutes Intravenous Every 24 hours 04/11/12 1436 04/13/12 1200   04/11/12 1400   clindamycin (CLEOCIN) IVPB 600 mg        600 mg 100 mL/hr over 30 Minutes Intravenous 3 times per day 04/11/12 1248            Assessment: 66 yo male on day 3 vancomycin for R hip cellulitis. Patient is afebrile and WBC are  trending down. Renal function significantly improved so will adjust vancomycin.  Antibiotics:  Clindamycin 7/3 >> Vancomycin 7/3 >>  Cultures: Waiting on deep tissue culture of inguinal node   Goal of Therapy:  Vancomycin trough level 10-15 mcg/ml  Plan:  1. Change vancomycin to 1000 mg IV q12h 2. Will follow-up in the am   [facility], [place].D., BCPS Clinical Pharmacist Pager: [phone] 04/13/2012 12:05 PM

## 2012-04-13 NOTE — Progress Notes (Signed)
Subjective:  Patient reports pain as marked.  He did not feel that his infection has improved at all. Despite his antibiotics, he still has redness and severe pain.  Objective:   VITALS:   Filed Vitals:   04/12/12 1639 04/12/12 2144 04/13/12 0656 04/13/12 1300  BP: 118/74 121/75 119/92 110/66  Pulse: 95 92 94 99  Temp: 98.3 F (36.8 C) 98.4 F (36.9 C) 98.6 F (37 C) 98.8 F (37.1 C)  TempSrc:      Resp: 16 18 18 18   Height:      Weight:      SpO2: 94% 93% 94% 95%    Neurologically intact Sensation intact distally Right inguinal region still has substantial cellulitis with a fairly large indurated area, no clear fluid collection that I can appreciate to palpation.   LABS  Results for orders placed during the hospital encounter of 04/11/12 (from the past 24 hour(s))  BASIC METABOLIC PANEL     Status: Abnormal   Collection Time   04/13/12  6:00 AM      Component Value Range   Sodium 137  135 - 145 mEq/L   Potassium 4.3  3.5 - 5.1 mEq/L   Chloride 103  96 - 112 mEq/L   CO2 26  19 - 32 mEq/L   Glucose, Bld 107 (*) 70 - 99 mg/dL   BUN 15  6 - 23 mg/dL   Creatinine, Ser 1.61  0.50 - 1.35 mg/dL   Calcium 8.8  8.4 - 09.6 mg/dL   GFR calc non Af Amer 76 (*) >90 mL/min   GFR calc Af Amer 89 (*) >90 mL/min    Ct Hip Right W Contrast  04/12/2012  *RADIOLOGY REPORT*  Clinical Data: Recent development of redness and swelling right hip and groin region.  Evaluate for abscess.  CT OF THE RIGHT HIP WITH CONTRAST  Technique:  Multidetector CT imaging was performed following the standard protocol during bolus administration of intravenous contrast.  Contrast: 80mL OMNIPAQUE IOHEXOL 300 MG/ML  SOLN .  Comparison: 03/28/2038 bone scan.  03/13/2012 right hip MR  Findings: Diffuse right pelvic/hip region subcutaneous inflammatory process extends from the level of the upper gluteal muscles inferiorly to the upper thigh lateral anterior and posterior to the right hip with extension into  the right inguinal region and upper aspect of the right scrotum.  The largest fluid collection is superficial to the muscles with maximal thickness of 10.6 mm just lateral to the gluteal muscles and 10.2 mm anterior to the hip.  No deep drainable abscess or significant fascial inflammation presently detected.  No soft tissue emphysema.  Prominent right hip joint degenerative changes with subchondral geode acetabular region.  No definitive findings of osteomyelitis. No significant hip joint effusion noted.  Enlarged right inguinal lymph nodes.  Vascular calcifications. Prostatic gland calcifications.  IMPRESSION: Diffuse cellulitis type changes surround the right hip/pelvis as described above.  No CT evidence of deep drainable abscess or significant fasciitis/osteomyelitis or septic arthritis however, if there are progressive symptoms, MR imaging may be considered as this would be more sensitive for detection of these latter findings.  Please see above discussion.  Original Report Authenticated By: Fuller Canada, M.D.    Assessment/Plan:     Principal Problem:  *Abscess of right hip  His culture has so far grown staph, done at [facility]. This was from the office. Sensitivities are pending. He has swabbed positive for MRSA, and so therefore we are treating  him for presumed MRSA cellulitis. I have discussed his CT scan with the radiologist, who felt that there was not a significant fluid collection, and did not feel that an MRI would add additional information, however there is persistent induration, there is substantial and is not responding to IV antibiotics yet. Were to give this another day, and if the indurated area over his inguinal region continues to be erythematous, and indurated, then we may proceed with I&D tomorrow. The risks benefits and alternatives are discussed at length with him.    Arrion Broaddus P 04/13/2012, 5:04 PM   Teryl Lucy, MD Cell [phone] Pager [phone]

## 2012-04-14 ENCOUNTER — Encounter (HOSPITAL_COMMUNITY): Payer: Self-pay | Admitting: Anesthesiology

## 2012-04-14 ENCOUNTER — Encounter (HOSPITAL_COMMUNITY)
Admission: AD | Disposition: A | Discharge status: Home / Self Care | Payer: Self-pay | Source: Ambulatory Visit | Attending: Orthopedic Surgery

## 2012-04-14 ENCOUNTER — Inpatient Hospital Stay (HOSPITAL_COMMUNITY): Payer: Medicare Other | Admitting: Anesthesiology

## 2012-04-14 HISTORY — PX: I&D EXTREMITY: SHX5045

## 2012-04-14 LAB — URINALYSIS, ROUTINE W REFLEX MICROSCOPIC
Leukocytes, UA: NEGATIVE
Nitrite: NEGATIVE
Protein, ur: NEGATIVE mg/dL
Specific Gravity, Urine: 1.01 (ref 1.005–1.030)
Urobilinogen, UA: 0.2 mg/dL (ref 0.0–1.0)

## 2012-04-14 LAB — BASIC METABOLIC PANEL
Calcium: 9.1 mg/dL (ref 8.4–10.5)
GFR calc Af Amer: 87 mL/min — ABNORMAL LOW (ref >90)
GFR calc non Af Amer: 75 mL/min — ABNORMAL LOW (ref >90)
Potassium: 5.2 mEq/L — ABNORMAL HIGH (ref 3.5–5.1)
Sodium: 141 mEq/L (ref 135–145)

## 2012-04-14 SURGERY — IRRIGATION AND DEBRIDEMENT EXTREMITY
Anesthesia: General | Site: Hip | Laterality: Right | Wound class: Dirty or Infected

## 2012-04-14 MED ORDER — ACETAMINOPHEN 10 MG/ML IV SOLN: 1000.0000 mg | Freq: Once | INTRAVENOUS | Status: DC | PRN

## 2012-04-14 MED ORDER — ROCURONIUM BROMIDE 100 MG/10ML IV SOLN
INTRAVENOUS | Status: DC | PRN
  Administered 2012-04-14: 40 mg via INTRAVENOUS

## 2012-04-14 MED ORDER — ONDANSETRON HCL 4 MG PO TABS: 4.0000 mg | ORAL_TABLET | Freq: Four times a day (QID) | ORAL | Status: DC | PRN

## 2012-04-14 MED ORDER — SUFENTANIL CITRATE 50 MCG/ML IV SOLN
INTRAVENOUS | Status: DC | PRN
  Administered 2012-04-14: 20 ug via INTRAVENOUS
  Administered 2012-04-14: 10 ug via INTRAVENOUS

## 2012-04-14 MED ORDER — DOCUSATE SODIUM 100 MG PO CAPS
100.0000 mg | ORAL_CAPSULE | Freq: Two times a day (BID) | ORAL | Status: DC
  Administered 2012-04-14 – 2012-04-15 (×3): 100 mg via ORAL
  Filled 2012-04-14 (×2): qty 1

## 2012-04-14 MED ORDER — SODIUM CHLORIDE 0.45 % IV SOLN
INTRAVENOUS | Status: DC
  Administered 2012-04-14 – 2012-04-15 (×2): via INTRAVENOUS

## 2012-04-14 MED ORDER — ZOLPIDEM TARTRATE 5 MG PO TABS: 5.0000 mg | ORAL_TABLET | Freq: Every evening | ORAL | Status: DC | PRN

## 2012-04-14 MED ORDER — OXYCODONE-ACETAMINOPHEN 5-325 MG PO TABS
1.0000 | ORAL_TABLET | ORAL | Status: DC | PRN
  Administered 2012-04-14 – 2012-04-15 (×4): 2 via ORAL
  Filled 2012-04-14 (×5): qty 2

## 2012-04-14 MED ORDER — ONDANSETRON HCL 4 MG/2ML IJ SOLN: 4.0000 mg | Freq: Once | INTRAMUSCULAR | Status: AC | PRN

## 2012-04-14 MED ORDER — NEOSTIGMINE METHYLSULFATE 1 MG/ML IJ SOLN
INTRAMUSCULAR | Status: DC | PRN
  Administered 2012-04-14: 5 mg via INTRAVENOUS

## 2012-04-14 MED ORDER — 0.9 % SODIUM CHLORIDE (POUR BTL) OPTIME
TOPICAL | Status: DC | PRN
  Administered 2012-04-14: 1000 mL

## 2012-04-14 MED ORDER — SODIUM CHLORIDE 0.9 % IR SOLN
Status: DC | PRN
  Administered 2012-04-14: 3000 mL

## 2012-04-14 MED ORDER — LACTATED RINGERS IV SOLN
INTRAVENOUS | Status: DC | PRN
  Administered 2012-04-14: 10:00:00 via INTRAVENOUS

## 2012-04-14 MED ORDER — METHOCARBAMOL 100 MG/ML IJ SOLN
500.0000 mg | Freq: Four times a day (QID) | INTRAVENOUS | Status: DC | PRN
  Filled 2012-04-14: qty 5

## 2012-04-14 MED ORDER — MIDAZOLAM HCL 5 MG/5ML IJ SOLN
INTRAMUSCULAR | Status: DC | PRN
  Administered 2012-04-14: 2 mg via INTRAVENOUS

## 2012-04-14 MED ORDER — GLYCOPYRROLATE 0.2 MG/ML IJ SOLN
INTRAMUSCULAR | Status: DC | PRN
  Administered 2012-04-14: 0.6 mg via INTRAVENOUS

## 2012-04-14 MED ORDER — METOCLOPRAMIDE HCL 10 MG PO TABS: 5.0000 mg | ORAL_TABLET | Freq: Three times a day (TID) | ORAL | Status: DC | PRN

## 2012-04-14 MED ORDER — ENOXAPARIN SODIUM 40 MG/0.4ML ~~LOC~~ SOLN
40.0000 mg | SUBCUTANEOUS | Status: DC
  Administered 2012-04-15: 40 mg via SUBCUTANEOUS
  Filled 2012-04-14 (×2): qty 0.4

## 2012-04-14 MED ORDER — PHENYLEPHRINE HCL 10 MG/ML IJ SOLN
INTRAMUSCULAR | Status: DC | PRN
  Administered 2012-04-14 (×2): 40 ug via INTRAVENOUS

## 2012-04-14 MED ORDER — DIPHENHYDRAMINE HCL 12.5 MG/5ML PO ELIX: 12.5000 mg | ORAL_SOLUTION | ORAL | Status: DC | PRN

## 2012-04-14 MED ORDER — ONDANSETRON HCL 4 MG/2ML IJ SOLN: 4.0000 mg | Freq: Four times a day (QID) | INTRAMUSCULAR | Status: DC | PRN

## 2012-04-14 MED ORDER — OXYCODONE HCL 5 MG PO TABS
5.0000 mg | ORAL_TABLET | ORAL | Status: DC | PRN
  Administered 2012-04-15: 10 mg via ORAL
  Administered 2012-04-15: 5 mg via ORAL
  Administered 2012-04-15: 10 mg via ORAL
  Filled 2012-04-14: qty 1
  Filled 2012-04-14 (×2): qty 2

## 2012-04-14 MED ORDER — SENNA 8.6 MG PO TABS
1.0000 | ORAL_TABLET | Freq: Two times a day (BID) | ORAL | Status: DC
  Administered 2012-04-14 – 2012-04-15 (×3): 8.6 mg via ORAL
  Filled 2012-04-14 (×4): qty 1

## 2012-04-14 MED ORDER — HYDROMORPHONE HCL PF 1 MG/ML IJ SOLN
0.5000 mg | INTRAMUSCULAR | Status: DC | PRN
  Filled 2012-04-14: qty 1

## 2012-04-14 MED ORDER — METHOCARBAMOL 500 MG PO TABS
500.0000 mg | ORAL_TABLET | Freq: Four times a day (QID) | ORAL | Status: DC | PRN
  Administered 2012-04-14 – 2012-04-15 (×2): 500 mg via ORAL
  Filled 2012-04-14 (×2): qty 1

## 2012-04-14 MED ORDER — LIDOCAINE HCL (CARDIAC) 20 MG/ML IV SOLN
INTRAVENOUS | Status: DC | PRN
  Administered 2012-04-14: 30 mg via INTRAVENOUS

## 2012-04-14 MED ORDER — HYDROMORPHONE HCL PF 1 MG/ML IJ SOLN: 0.2500 mg | INTRAMUSCULAR | Status: AC | PRN

## 2012-04-14 MED ORDER — POTASSIUM CHLORIDE IN NACL 20-0.45 MEQ/L-% IV SOLN
INTRAVENOUS | Status: DC
  Filled 2012-04-14: qty 1000

## 2012-04-14 MED ORDER — PROPOFOL 10 MG/ML IV EMUL
INTRAVENOUS | Status: DC | PRN
  Administered 2012-04-14: 150 mg via INTRAVENOUS

## 2012-04-14 MED ORDER — METOCLOPRAMIDE HCL 5 MG/ML IJ SOLN: 5.0000 mg | Freq: Three times a day (TID) | INTRAMUSCULAR | Status: DC | PRN

## 2012-04-14 MED ORDER — ONDANSETRON HCL 4 MG/2ML IJ SOLN
INTRAMUSCULAR | Status: DC | PRN
  Administered 2012-04-14: 4 mg via INTRAVENOUS

## 2012-04-14 SURGICAL SUPPLY — 44 items
BANDAGE ELASTIC 4 VELCRO ST LF (GAUZE/BANDAGES/DRESSINGS) IMPLANT
BANDAGE ELASTIC 6 VELCRO ST LF (GAUZE/BANDAGES/DRESSINGS) IMPLANT
BANDAGE GAUZE ELAST BULKY 4 IN (GAUZE/BANDAGES/DRESSINGS) IMPLANT
BNDG COHESIVE 4X5 TAN STRL (GAUZE/BANDAGES/DRESSINGS) IMPLANT
BOOTCOVER CLEANROOM LRG (PROTECTIVE WEAR) IMPLANT
CLOTH BEACON ORANGE TIMEOUT ST (SAFETY) ×2 IMPLANT
CONT SPEC STER OR (MISCELLANEOUS) ×2 IMPLANT
COVER SURGICAL LIGHT HANDLE (MISCELLANEOUS) ×2 IMPLANT
CUFF TOURNIQUET SINGLE 34IN LL (TOURNIQUET CUFF) IMPLANT
DRSG PAD ABDOMINAL 8X10 ST (GAUZE/BANDAGES/DRESSINGS) ×4 IMPLANT
DURAPREP 26ML APPLICATOR (WOUND CARE) IMPLANT
ELECT REM PT RETURN 9FT ADLT (ELECTROSURGICAL) ×2
ELECTRODE REM PT RTRN 9FT ADLT (ELECTROSURGICAL) ×1 IMPLANT
EVACUATOR 1/8 PVC DRAIN (DRAIN) IMPLANT
GAUZE PACKING IODOFORM 1 (PACKING) ×4 IMPLANT
GAUZE XEROFORM 1X8 LF (GAUZE/BANDAGES/DRESSINGS) IMPLANT
GLOVE BIOGEL PI IND STRL 8 (GLOVE) ×1 IMPLANT
GLOVE BIOGEL PI INDICATOR 8 (GLOVE) ×1
GLOVE ORTHO TXT STRL SZ7.5 (GLOVE) ×2 IMPLANT
GLOVE SURG ORTHO 8.0 STRL STRW (GLOVE) ×4 IMPLANT
GOWN STRL NON-REIN LRG LVL3 (GOWN DISPOSABLE) IMPLANT
HANDPIECE INTERPULSE COAX TIP (DISPOSABLE)
KIT BASIN OR (CUSTOM PROCEDURE TRAY) ×2 IMPLANT
KIT ROOM TURNOVER OR (KITS) ×2 IMPLANT
MANIFOLD NEPTUNE II (INSTRUMENTS) ×2 IMPLANT
NDL SAFETY ECLIPSE 18X1.5 (NEEDLE) ×1 IMPLANT
NEEDLE HYPO 18GX1.5 SHARP (NEEDLE) ×1
NS IRRIG 1000ML POUR BTL (IV SOLUTION) ×2 IMPLANT
PACK ORTHO EXTREMITY (CUSTOM PROCEDURE TRAY) ×2 IMPLANT
PAD ARMBOARD 7.5X6 YLW CONV (MISCELLANEOUS) ×4 IMPLANT
SET HNDPC FAN SPRY TIP SCT (DISPOSABLE) IMPLANT
SPONGE GAUZE 4X4 12PLY (GAUZE/BANDAGES/DRESSINGS) ×2 IMPLANT
SPONGE LAP 18X18 X RAY DECT (DISPOSABLE) ×2 IMPLANT
STOCKINETTE IMPERVIOUS 9X36 MD (GAUZE/BANDAGES/DRESSINGS) IMPLANT
SUT ETHILON 3 0 PS 1 (SUTURE) ×2 IMPLANT
SYR CONTROL 10ML LL (SYRINGE) ×2 IMPLANT
TAPE CLOTH SURG 4X10 WHT LF (GAUZE/BANDAGES/DRESSINGS) ×2 IMPLANT
TOWEL OR 17X24 6PK STRL BLUE (TOWEL DISPOSABLE) ×2 IMPLANT
TOWEL OR 17X26 10 PK STRL BLUE (TOWEL DISPOSABLE) ×2 IMPLANT
TUBE ANAEROBIC SPECIMEN COL (MISCELLANEOUS) ×2 IMPLANT
TUBE CONNECTING 12X1/4 (SUCTIONS) ×2 IMPLANT
UNDERPAD 30X30 INCONTINENT (UNDERPADS AND DIAPERS) ×2 IMPLANT
WATER STERILE IRR 1000ML POUR (IV SOLUTION) IMPLANT
YANKAUER SUCT BULB TIP NO VENT (SUCTIONS) ×2 IMPLANT

## 2012-04-14 NOTE — Anesthesia Preprocedure Evaluation (Addendum)
Anesthesia Evaluation  Patient identified by MRN, date of birth, ID band Patient awake    Reviewed: Allergy & Precautions, H&P , NPO status , Patient's Chart, lab work & pertinent test results  Airway Mallampati: II      Dental  (+) Poor Dentition and Dental Advisory Given   Pulmonary  breath sounds clear to auscultation        Cardiovascular Rhythm:Regular Rate:Normal     Neuro/Psych    GI/Hepatic   Endo/Other    Renal/GU      Musculoskeletal   Abdominal   Peds  Hematology   Anesthesia Other Findings   Reproductive/Obstetrics                          Anesthesia Physical Anesthesia Plan  ASA: III  Anesthesia Plan: General   Post-op Pain Management:    Induction: Intravenous  Airway Management Planned: Oral ETT  Additional Equipment:   Intra-op Plan:   Post-operative Plan: Extubation in OR  Informed Consent: I have reviewed the patients History and Physical, chart, labs and discussed the procedure including the risks, benefits and alternatives for the proposed anesthesia with the patient or authorized representative who has indicated his/her understanding and acceptance.   Dental advisory given  Plan Discussed with:   Anesthesia Plan Comments: (Infection R. Hip Htn Obesity Poor dentition  Plan GA  Kipp Brood, MD)        Anesthesia Quick Evaluation

## 2012-04-14 NOTE — Preoperative (Signed)
Beta Blockers   Reason not to administer Beta Blockers:Not Applicable 

## 2012-04-14 NOTE — Anesthesia Postprocedure Evaluation (Signed)
  Anesthesia Post-op Note  Patient: [name] [name]. Imran  Procedure(s) Performed: Procedure(s) (LRB): IRRIGATION AND DEBRIDEMENT EXTREMITY (Right)  Patient Location: PACU  Anesthesia Type: General  Level of Consciousness: awake, alert  and oriented  Airway and Oxygen Therapy: Patient Spontanous Breathing  Post-op Pain: mild  Post-op Assessment: Post-op Vital signs reviewed and Patient's Cardiovascular Status Stable  Post-op Vital Signs: stable  Complications: No apparent anesthesia complications

## 2012-04-14 NOTE — Transfer of Care (Signed)
Immediate Anesthesia Transfer of Care Note  Patient: [name] [name]. Juma  Procedure(s) Performed: Procedure(s) (LRB): IRRIGATION AND DEBRIDEMENT EXTREMITY (Right)  Patient Location: PACU  Anesthesia Type: General  Level of Consciousness: awake, alert , oriented and patient cooperative  Airway & Oxygen Therapy: Patient Spontanous Breathing and Patient connected to nasal cannula oxygen  Post-op Assessment: Report given to PACU RN, Post -op Vital signs reviewed and stable and Patient moving all extremities X 4  Post vital signs: Reviewed and stable  Complications: No apparent anesthesia complications

## 2012-04-14 NOTE — Op Note (Signed)
04/11/2012 - 04/14/2012  11:38 AM  PATIENT:  [name] [name]    PRE-OPERATIVE DIAGNOSIS:  right hip cellulitis possible abscess  POST-OPERATIVE DIAGNOSIS:  Right hip subcutaneous abscess  PROCEDURE:  IRRIGATION AND DEBRIDEMENT EXTREMITY, subcutaneous abscess of the right hip, 6 x 4 cm, involving skin, subcutaneous tissue, and the superficial fascia of the inguinal region.  SURGEON:  Eulas Post, MD  PHYSICIAN ASSISTANT: Janace Litten, OPA-C, present and scrubbed throughout the case.  ANESTHESIA:   General  PREOPERATIVE INDICATIONS:  [name] [name] is a  66 y.o. male with a diagnosis of right hip cellulitis possible abscess who failed conservative measures and elected for surgical management.    The risks benefits and alternatives were discussed with the patient preoperatively including but not limited to the risks of infection, bleeding, nerve injury, cardiopulmonary complications, the need for revision surgery, among others, and the patient was willing to proceed.  OPERATIVE IMPLANTS: Packing sponges, iodoform gauze  OPERATIVE FINDINGS: Necrotic abscess of the subcutaneous tissue  OPERATIVE PROCEDURE: The patient is brought to the operating room placed in supine position. General anesthesia administered. He was already on vancomycin and clindamycin, but has not responded clinically. The right anterior inguinal region was prepped and draped in usual sterile fashion. Time out was performed. Incision was made in line with the inguinal crease, at the location of maximal "pointing".  Beneath the skin, there was subcutaneous pus which drained out easily. I debrided the subcutaneous fat, as well as the necrotic tissue, this did not penetrate or track deep to the fascia, but did overlie the fascia of the inguinal region. Pulse lavage was utilized for a total of 3 L. The wound was packed with iodoform gauze and very loosely closed with nylon suture. Sterile gauze was applied. He was awakened  and returned to the PACU in stable and satisfactory condition. There no complications.

## 2012-04-14 NOTE — Progress Notes (Signed)
Subjective: Ongoing moderate to severe pain without improvement.  Objective:   VITALS:   Filed Vitals:   04/13/12 0656 04/13/12 1300 04/13/12 2200 04/14/12 0427  BP: 119/92 110/66 107/59 113/69  Pulse: 94 99 98 91  Temp: 98.6 F (37 C) 98.8 F (37.1 C) 99.6 F (37.6 C) 98.2 F (36.8 C)  TempSrc:   Oral   Resp: 18 18 20 18   Height:      Weight:      SpO2: 94% 95% 94% 94%    Right hip inguinal area is severely erythematous, indurated, and appears to be "pointing".  LABS  Results for orders placed during the hospital encounter of 04/11/12 (from the past 24 hour(s))  URINALYSIS, ROUTINE W REFLEX MICROSCOPIC     Status: Normal   Collection Time   04/14/12  4:32 AM      Component Value Range   Color, Urine YELLOW  YELLOW   APPearance CLEAR  CLEAR   Specific Gravity, Urine 1.010  1.005 - 1.030   pH 6.0  5.0 - 8.0   Glucose, UA NEGATIVE  NEGATIVE mg/dL   Hgb urine dipstick NEGATIVE  NEGATIVE   Bilirubin Urine NEGATIVE  NEGATIVE   Ketones, ur NEGATIVE  NEGATIVE mg/dL   Protein, ur NEGATIVE  NEGATIVE mg/dL   Urobilinogen, UA 0.2  0.0 - 1.0 mg/dL   Nitrite NEGATIVE  NEGATIVE   Leukocytes, UA NEGATIVE  NEGATIVE  BASIC METABOLIC PANEL     Status: Abnormal   Collection Time   04/14/12  7:08 AM      Component Value Range   Sodium 141  135 - 145 mEq/L   Potassium 5.2 (*) 3.5 - 5.1 mEq/L   Chloride 106  96 - 112 mEq/L   CO2 22  19 - 32 mEq/L   Glucose, Bld 107 (*) 70 - 99 mg/dL   BUN 9  6 - 23 mg/dL   Creatinine, Ser 7.82  0.50 - 1.35 mg/dL   Calcium 9.1  8.4 - 95.6 mg/dL   GFR calc non Af Amer 75 (*) >90 mL/min   GFR calc Af Amer 87 (*) >90 mL/min    Ct Hip Right W Contrast  04/12/2012  *RADIOLOGY REPORT*  Clinical Data: Recent development of redness and swelling right hip and groin region.  Evaluate for abscess.  CT OF THE RIGHT HIP WITH CONTRAST  Technique:  Multidetector CT imaging was performed following the standard protocol during bolus administration of  intravenous contrast.  Contrast: 80mL OMNIPAQUE IOHEXOL 300 MG/ML  SOLN .  Comparison: 03/28/2038 bone scan.  03/13/2012 right hip MR  Findings: Diffuse right pelvic/hip region subcutaneous inflammatory process extends from the level of the upper gluteal muscles inferiorly to the upper thigh lateral anterior and posterior to the right hip with extension into the right inguinal region and upper aspect of the right scrotum.  The largest fluid collection is superficial to the muscles with maximal thickness of 10.6 mm just lateral to the gluteal muscles and 10.2 mm anterior to the hip.  No deep drainable abscess or significant fascial inflammation presently detected.  No soft tissue emphysema.  Prominent right hip joint degenerative changes with subchondral geode acetabular region.  No definitive findings of osteomyelitis. No significant hip joint effusion noted.  Enlarged right inguinal lymph nodes.  Vascular calcifications. Prostatic gland calcifications.  IMPRESSION: Diffuse cellulitis type changes surround the right hip/pelvis as described above.  No CT evidence of deep drainable abscess or significant fasciitis/osteomyelitis or septic  arthritis however, if there are progressive symptoms, MR imaging may be considered as this would be more sensitive for detection of these latter findings.  Please see above discussion.  Original Report Authenticated By: Fuller Canada, M.D.    Assessment/Plan: Day of Surgery   Principal Problem:  *Abscess of right hip  His hip has continued to be problematic, and not responded to IV antibiotics, and appears to be pointing. We will plan for I&D today. The risks benefits and alternatives were discussed.   Goldie Dimmer P 04/14/2012, 9:44 AM   Teryl Lucy, MD Cell [phone] Pager [phone]

## 2012-04-15 LAB — VANCOMYCIN, TROUGH: Vancomycin Tr: 15.4 ug/mL (ref 10.0–20.0)

## 2012-04-15 MED ORDER — DOXYCYCLINE HYCLATE 100 MG PO TABS
100.0000 mg | ORAL_TABLET | Freq: Two times a day (BID) | ORAL | Status: DC
  Filled 2012-04-15: qty 1

## 2012-04-15 MED ORDER — DOXYCYCLINE HYCLATE 100 MG PO TABS: 100.0000 mg | ORAL_TABLET | Freq: Two times a day (BID) | ORAL | Status: AC

## 2012-04-15 MED ORDER — OXYCODONE-ACETAMINOPHEN 10-325 MG PO TABS: 1.0000 | ORAL_TABLET | Freq: Four times a day (QID) | ORAL | Status: DC | PRN

## 2012-04-15 NOTE — Progress Notes (Signed)
ANTIBIOTIC CONSULT NOTE - FOLLOW UP  Pharmacy Consult for vancomycin Indication: R hip cellulitis  No Known Allergies  Patient Measurements: Height: 6' (182.9 cm) Weight: 220 lb (99.791 kg) (per patient) IBW/kg (Calculated) : 77.6   Vital Signs: Temp: 98.5 F (36.9 C) (07/07 1344) Temp src: Oral (07/07 1344) BP: 120/76 mmHg (07/07 1344) Pulse Rate: 70  (07/07 1344) Intake/Output from previous day: 07/06 0701 - 07/07 0700 In: 1340 [P.O.:440; I.V.:800; IV Piggyback:100] Out: -  Intake/Output from this shift: Total I/O In: 240 [P.O.:240] Out: -   Labs:  Basename 04/14/12 0708 04/13/12 0600  WBC -- --  HGB -- --  PLT -- --  LABCREA -- --  CREATININE 1.01 1.00   Estimated Creatinine Clearance: 88 ml/min (by C-G formula based on Cr of 1.01).  Basename 04/15/12 1320  VANCOTROUGH 15.4  VANCOPEAK --  VANCORANDOM --  GENTTROUGH --  GENTPEAK --  GENTRANDOM --  TOBRATROUGH --  TOBRAPEAK --  TOBRARND --  AMIKACINPEAK --  AMIKACINTROU --  AMIKACIN --     Microbiology: Recent Results (from the past 720 hour(s))  MRSA PCR SCREENING     Status: Abnormal   Collection Time   04/12/12  6:00 AM      Component Value Range Status Comment   MRSA by PCR POSITIVE (*) NEGATIVE Final   CULTURE, ROUTINE-ABSCESS     Status: Normal (Preliminary result)   Collection Time   04/14/12 12:10 PM      Component Value Range Status Comment   Specimen Description ABSCESS RIGHT HIP   Final    Special Requests     Final    Value: ASPIRATE IN SYRINGE FROM RIGHT HIP PT ON CLINDAMYCIN VANC   Gram Stain     Final    Value: MODERATE WBC PRESENT, PREDOMINANTLY PMN     NO SQUAMOUS EPITHELIAL CELLS SEEN     MODERATE GRAM POSITIVE COCCI IN PAIRS     IN CLUSTERS   Culture Culture reincubated for better growth   Final    Report Status PENDING   Incomplete   ANAEROBIC CULTURE     Status: Normal (Preliminary result)   Collection Time   04/14/12 12:10 PM      Component Value Range Status Comment   Specimen Description ABSCESS RIGHT HIP   Final    Special Requests ASPIRATE IN SYRINGE PT ON VANCOMYCIN CLINDAMYCIN   Final    Gram Stain     Final    Value: MODERATE WBC PRESENT, PREDOMINANTLY PMN     NO SQUAMOUS EPITHELIAL CELLS SEEN     MODERATE GRAM POSITIVE COCCI IN PAIRS     IN CLUSTERS   Culture     Final    Value: NO ANAEROBES ISOLATED; CULTURE IN PROGRESS FOR 5 DAYS   Report Status PENDING   Incomplete   CULTURE, ROUTINE-ABSCESS     Status: Normal (Preliminary result)   Collection Time   04/14/12 12:12 PM      Component Value Range Status Comment   Specimen Description ABSCESS RIGHT HIP   Final    Special Requests ON SWAB PT ON CLINDAMYCIN VANC   Final    Gram Stain     Final    Value: NO WBC SEEN     NO SQUAMOUS EPITHELIAL CELLS SEEN     MODERATE GRAM POSITIVE COCCI IN PAIRS     IN CLUSTERS   Culture     Final    Value: MODERATE STAPHYLOCOCCUS AUREUS  Note: RIFAMPIN AND GENTAMICIN SHOULD NOT BE USED AS SINGLE DRUGS FOR TREATMENT OF STAPH INFECTIONS.   Report Status PENDING   Incomplete   ANAEROBIC CULTURE     Status: Normal (Preliminary result)   Collection Time   04/14/12 12:12 PM      Component Value Range Status Comment   Specimen Description ABSCESS RIGHT HIP   Final    Special Requests ON SWAB PT ON CLINDAMYCIN VANC   Final    Gram Stain     Final    Value: NO WBC SEEN     NO SQUAMOUS EPITHELIAL CELLS SEEN     MODERATE GRAM POSITIVE COCCI IN PAIRS     IN CLUSTERS   Culture     Final    Value: NO ANAEROBES ISOLATED; CULTURE IN PROGRESS FOR 5 DAYS   Report Status PENDING   Incomplete   TISSUE CULTURE     Status: Normal (Preliminary result)   Collection Time   04/14/12 12:14 PM      Component Value Range Status Comment   Specimen Description TISSUE RIGHT HIP   Final    Special Requests SUBCUTANEOUS TISSUE PT ON VANCOMYCIN CLINDAMYCIN   Final    Gram Stain     Final    Value: RARE WBC PRESENT, PREDOMINANTLY PMN     RARE SQUAMOUS EPITHELIAL CELLS PRESENT     NO  ORGANISMS SEEN   Culture     Final    Value: MODERATE STAPHYLOCOCCUS AUREUS     Note: RIFAMPIN AND GENTAMICIN SHOULD NOT BE USED AS SINGLE DRUGS FOR TREATMENT OF STAPH INFECTIONS.   Report Status PENDING   Incomplete   ANAEROBIC CULTURE     Status: Normal (Preliminary result)   Collection Time   04/14/12 12:14 PM      Component Value Range Status Comment   Specimen Description TISSUE RIGHT HIP   Final    Special Requests SUBCUTANEOUS TISSUE PT ON VANC CLINDA   Final    Gram Stain     Final    Value: RARE WBC PRESENT, PREDOMINANTLY PMN     RARE SQUAMOUS EPITHELIAL CELLS PRESENT     NO ORGANISMS SEEN   Culture     Final    Value: NO ANAEROBES ISOLATED; CULTURE IN PROGRESS FOR 5 DAYS   Report Status PENDING   Incomplete     Anti-infectives     Start     Dose/Rate Route Frequency Ordered Stop   04/13/12 1300   vancomycin (VANCOCIN) IVPB 1000 mg/200 mL premix        1,000 mg 200 mL/hr over 60 Minutes Intravenous Every 12 hours 04/13/12 1200     04/11/12 1530   vancomycin (VANCOCIN) 1,500 mg in sodium chloride 0.9 % 500 mL IVPB  Status:  Discontinued        1,500 mg 250 mL/hr over 120 Minutes Intravenous Every 24 hours 04/11/12 1436 04/13/12 1200   04/11/12 1400   clindamycin (CLEOCIN) IVPB 600 mg        600 mg 100 mL/hr over 30 Minutes Intravenous 3 times per day 04/11/12 1248            Assessment: 66 yo male on day 3 vancomycin for R hip cellulitis. Patient is afebrile with WBC last noted to be 11.9 on 7/4, down from 13 on 7/3.  Vancomycin level drawn today at 1320.  Seems administration of vancomycin has gotten off schedule, next dose due at 1700.  Nevertheless, suspect true trough at  1630 would still be within goal of 10-15.  Antibiotics:  Clindamycin 7/3 >> Vancomycin 7/3 >>  Cultures: 7/6 R Hip abscess: mod GPC pairs/clusters on GS 7/4 MRSA PCA screen: positive  Goal of Therapy:  Vancomycin trough level 10-15 mcg/ml  Plan:  -Continue vancomycin 1g IV q12h    -Follow up SCr, UOP, cultures, clinical course and adjust as clinically indicated  Anora Schwenke L. Illene Bolus, PharmD, BCPS Clinical Pharmacist Pager: [phone] 04/15/2012 2:20 PM

## 2012-04-15 NOTE — Progress Notes (Signed)
Patient ID: [name] [name]. [name] [name], male   DOB: 01/28/1946, 66 y.o.   MRN: [id number]     Subjective:  Patient reports pain as mild to moderate.  Patient states that he is doing much better.  Objective:   VITALS:   Filed Vitals:   04/14/12 1238 04/14/12 1253 04/14/12 2333 04/15/12 0646  BP:  110/67 105/71 116/76  Pulse: 91 90 86 85  Temp: 97.7 F (36.5 C) 97.5 F (36.4 C) 97.8 F (36.6 C) 98.4 F (36.9 C)  TempSrc:  Axillary Oral Oral  Resp: 16 18 18 18   Height:      Weight:      SpO2: 97% 100% 97% 96%    ABD soft Sensation intact distally Dorsiflexion/Plantar flexion intact Incision: moderate drainage Dressing changed wound well approximated redness around the hip is decreasing Packing gauze removed and dry dressing applied.  Patient tolerated well.  LABS  Results for orders placed during the hospital encounter of 04/11/12 (from the past 24 hour(s))  CULTURE, ROUTINE-ABSCESS     Status: Normal (Preliminary result)   Collection Time   04/14/12 12:10 PM      Component Value Range   Specimen Description ABSCESS RIGHT HIP     Special Requests       Value: ASPIRATE IN SYRINGE FROM RIGHT HIP PT ON CLINDAMYCIN VANC   Gram Stain       Value: MODERATE WBC PRESENT, PREDOMINANTLY PMN     NO SQUAMOUS EPITHELIAL CELLS SEEN     MODERATE GRAM POSITIVE COCCI IN PAIRS     IN CLUSTERS   Culture Culture reincubated for better growth     Report Status PENDING    ANAEROBIC CULTURE     Status: Normal (Preliminary result)   Collection Time   04/14/12 12:10 PM      Component Value Range   Specimen Description ABSCESS RIGHT HIP     Special Requests ASPIRATE IN SYRINGE PT ON VANCOMYCIN CLINDAMYCIN     Gram Stain       Value: MODERATE WBC PRESENT, PREDOMINANTLY PMN     NO SQUAMOUS EPITHELIAL CELLS SEEN     MODERATE GRAM POSITIVE COCCI IN PAIRS     IN CLUSTERS   Culture PENDING     Report Status PENDING    CULTURE, ROUTINE-ABSCESS     Status: Normal (Preliminary result)   Collection Time     04/14/12 12:12 PM      Component Value Range   Specimen Description ABSCESS RIGHT HIP     Special Requests ON SWAB PT ON CLINDAMYCIN VANC     Gram Stain       Value: NO WBC SEEN     NO SQUAMOUS EPITHELIAL CELLS SEEN     MODERATE GRAM POSITIVE COCCI IN PAIRS     IN CLUSTERS   Culture       Value: MODERATE STAPHYLOCOCCUS AUREUS     Note: RIFAMPIN AND GENTAMICIN SHOULD NOT BE USED AS SINGLE DRUGS FOR TREATMENT OF STAPH INFECTIONS.   Report Status PENDING    ANAEROBIC CULTURE     Status: Normal (Preliminary result)   Collection Time   04/14/12 12:12 PM      Component Value Range   Specimen Description ABSCESS RIGHT HIP     Special Requests ON SWAB PT ON CLINDAMYCIN VANC     Gram Stain       Value: NO WBC SEEN     NO SQUAMOUS EPITHELIAL CELLS SEEN     MODERATE GRAM POSITIVE  COCCI IN PAIRS     IN CLUSTERS   Culture PENDING     Report Status PENDING    TISSUE CULTURE     Status: Normal (Preliminary result)   Collection Time   04/14/12 12:14 PM      Component Value Range   Specimen Description TISSUE RIGHT HIP     Special Requests SUBCUTANEOUS TISSUE PT ON VANCOMYCIN CLINDAMYCIN     Gram Stain       Value: RARE WBC PRESENT, PREDOMINANTLY PMN     RARE SQUAMOUS EPITHELIAL CELLS PRESENT     NO ORGANISMS SEEN   Culture       Value: MODERATE STAPHYLOCOCCUS AUREUS     Note: RIFAMPIN AND GENTAMICIN SHOULD NOT BE USED AS SINGLE DRUGS FOR TREATMENT OF STAPH INFECTIONS.   Report Status PENDING    ANAEROBIC CULTURE     Status: Normal (Preliminary result)   Collection Time   04/14/12 12:14 PM      Component Value Range   Specimen Description TISSUE RIGHT HIP     Special Requests SUBCUTANEOUS TISSUE PT ON VANC CLINDA     Gram Stain       Value: RARE WBC PRESENT, PREDOMINANTLY PMN     RARE SQUAMOUS EPITHELIAL CELLS PRESENT     NO ORGANISMS SEEN   Culture PENDING     Report Status PENDING      No results found.  Assessment/Plan: 1 Day Post-Op   Principal Problem:  *Abscess of right  hip   Advance diet Up with therapy Continue ABx awaiting culture results.  He lost his IV, so we will discharge home today, as his wound is improving, with close followup. Haskel Khan 04/15/2012, 8:40 AM   Teryl Lucy, MD Cell [phone] Pager [phone]

## 2012-04-15 NOTE — Discharge Summary (Signed)
Physician Discharge Summary  Patient ID: [name] [name]. [place] MRN: [id number] DOB/AGE: 67-Aug-1947 67 y.o.  Admit date: 04/11/2012 Discharge date: 04/15/2012  Admission Diagnoses:  Abscess of right hip  Discharge Diagnoses:  Principal Problem:  *Abscess of right hip   Past Medical History  Diagnosis Date  . Abscess of right hip 04/11/2012  . Nephrolithiasis   . Medial epicondylitis   . Gastric ulcer   . Hypertension     Surgeries: Procedure(s): IRRIGATION AND DEBRIDEMENT EXTREMITY on 04/11/2012 - 04/14/2012   Consultants (if any):    Discharged Condition: Improved  Hospital Course: [name] [name]. [name] is an 66 y.o. male who was admitted 04/11/2012 with a diagnosis of Abscess of right hip and went to the operating room on 04/11/2012 - 04/14/2012 and underwent the above named procedures.    He was given perioperative antibiotics:  Anti-infectives     Start     Dose/Rate Route Frequency Ordered Stop   04/15/12 2000   doxycycline (VIBRA-TABS) tablet 100 mg        100 mg Oral Every 12 hours 04/15/12 1503     04/15/12 0000   doxycycline (VIBRA-TABS) 100 MG tablet        100 mg Oral Every 12 hours 04/15/12 1629 04/25/12 2359   04/13/12 1300   vancomycin (VANCOCIN) IVPB 1000 mg/200 mL premix  Status:  Discontinued        1,000 mg 200 mL/hr over 60 Minutes Intravenous Every 12 hours 04/13/12 1200 04/15/12 1503   04/11/12 1530   vancomycin (VANCOCIN) 1,500 mg in sodium chloride 0.9 % 500 mL IVPB  Status:  Discontinued        1,500 mg 250 mL/hr over 120 Minutes Intravenous Every 24 hours 04/11/12 1436 04/13/12 1200   04/11/12 1400   clindamycin (CLEOCIN) IVPB 600 mg  Status:  Discontinued        600 mg 100 mL/hr over 30 Minutes Intravenous 3 times per day 04/11/12 1248 04/15/12 1503        .  He was given sequential compression devices, early ambulation, and Lovenox for DVT prophylaxis.  He benefited maximally from the hospital stay and there were no complications.  His nasal swab  demonstrated MRSA, and his culture taken at the office demonstrated staph, sensitivities pending. He was given vancomycin, as well as clindamycin, until he lost his IV today, and therefore given that his wound was improving, he had much less redness, he is planning to be discharged on by mouth antibiotics.  Recent vital signs:  Filed Vitals:   04/15/12 1344  BP: 120/76  Pulse: 70  Temp: 98.5 F (36.9 C)  Resp: 18    Recent laboratory studies:  Lab Results  Component Value Date   HGB 12.1* 04/12/2012   HGB 13.0 04/11/2012   Lab Results  Component Value Date   WBC 11.9* 04/12/2012   PLT 183 04/12/2012   No results found for this basename: INR   Lab Results  Component Value Date   NA 141 04/14/2012   K 5.2* 04/14/2012   CL 106 04/14/2012   CO2 22 04/14/2012   BUN 9 04/14/2012   CREATININE 1.01 04/14/2012   GLUCOSE 107* 04/14/2012    Discharge Medications:   Medication List  As of 04/15/2012  4:30 PM   TAKE these medications         ALPRAZolam 0.5 MG tablet   Commonly known as: XANAX   Take 0.5 mg by mouth 2 (two) times daily as needed. Anxiety  amLODipine 5 MG tablet   Commonly known as: NORVASC   Take 5 mg by mouth daily.      benazepril 20 MG tablet   Commonly known as: LOTENSIN   Take 20 mg by mouth daily.      bisacodyl 5 MG EC tablet   Commonly known as: DULCOLAX   Take 5 mg by mouth daily.      doxycycline 100 MG tablet   Commonly known as: VIBRA-TABS   Take 1 tablet (100 mg total) by mouth every 12 (twelve) hours.      ergocalciferol 50000 UNITS capsule   Commonly known as: VITAMIN D2   Take 50,000 Units by mouth 2 (two) times a week.      hydrochlorothiazide 12.5 MG tablet   Commonly known as: HYDRODIURIL   Take 12.5 mg by mouth daily.      oxyCODONE-acetaminophen 10-325 MG per tablet   Commonly known as: PERCOCET   Take 1-2 tablets by mouth every 6 (six) hours as needed. For pain            Diagnostic Studies: Ct Hip Right W Contrast  04/12/2012   *RADIOLOGY REPORT*  Clinical Data: Recent development of redness and swelling right hip and groin region.  Evaluate for abscess.  CT OF THE RIGHT HIP WITH CONTRAST  Technique:  Multidetector CT imaging was performed following the standard protocol during bolus administration of intravenous contrast.  Contrast: 80mL OMNIPAQUE IOHEXOL 300 MG/ML  SOLN .  Comparison: 03/28/2038 bone scan.  03/13/2012 right hip MR  Findings: Diffuse right pelvic/hip region subcutaneous inflammatory process extends from the level of the upper gluteal muscles inferiorly to the upper thigh lateral anterior and posterior to the right hip with extension into the right inguinal region and upper aspect of the right scrotum.  The largest fluid collection is superficial to the muscles with maximal thickness of 10.6 mm just lateral to the gluteal muscles and 10.2 mm anterior to the hip.  No deep drainable abscess or significant fascial inflammation presently detected.  No soft tissue emphysema.  Prominent right hip joint degenerative changes with subchondral geode acetabular region.  No definitive findings of osteomyelitis. No significant hip joint effusion noted.  Enlarged right inguinal lymph nodes.  Vascular calcifications. Prostatic gland calcifications.  IMPRESSION: Diffuse cellulitis type changes surround the right hip/pelvis as described above.  No CT evidence of deep drainable abscess or significant fasciitis/osteomyelitis or septic arthritis however, if there are progressive symptoms, MR imaging may be considered as this would be more sensitive for detection of these latter findings.  Please see above discussion.  Original Report Authenticated By: Fuller Canada, M.D.    Disposition: he'll be discharged home with follow up with me on Wednesday for a wound check.  Discharge Orders    Future Orders Please Complete By Expires   Diet general      Call MD / Call 911      Comments:   If you experience chest pain or shortness of  breath, CALL 911 and be transported to the hospital emergency room.  If you develope a fever above 101 F, pus (white drainage) or increased drainage or redness at the wound, or calf pain, call your surgeon's office.   Discharge instructions      Comments:   Change dressings daily.  Call if worsening redness or drainage.   Constipation Prevention      Comments:   Drink plenty of fluids.  Prune juice may be helpful.  You  may use a stool softener, such as Colace (over the counter) 100 mg twice a day.  Use MiraLax (over the counter) for constipation as needed.      Follow-up Information    Follow up with Eulas Post, MD. Schedule an appointment as soon as possible for a visit on 04/18/2012.   Contact information:   [facility] [address], [address] [place] [address] [phone]           Signed: Eulas Post 04/15/2012, 4:30 PM

## 2012-04-16 ENCOUNTER — Encounter (HOSPITAL_COMMUNITY): Payer: Self-pay | Admitting: Orthopedic Surgery

## 2012-04-16 LAB — CULTURE, ROUTINE-ABSCESS

## 2012-04-16 LAB — TISSUE CULTURE

## 2012-04-17 LAB — CULTURE, ROUTINE-ABSCESS

## 2012-04-19 LAB — ANAEROBIC CULTURE

## 2012-10-31 ENCOUNTER — Other Ambulatory Visit: Payer: Self-pay | Admitting: Orthopedic Surgery

## 2012-10-31 DIAGNOSIS — M25551 Pain in right hip: Secondary | ICD-10-CM

## 2012-11-03 ENCOUNTER — Ambulatory Visit
Admission: RE | Admit: 2012-11-03 | Discharge: 2012-11-03 | Disposition: A | Discharge status: Home / Self Care | Payer: Medicare Other | Source: Ambulatory Visit | Attending: Orthopedic Surgery | Admitting: Orthopedic Surgery

## 2012-11-03 DIAGNOSIS — M25551 Pain in right hip: Secondary | ICD-10-CM

## 2012-11-15 ENCOUNTER — Encounter (HOSPITAL_COMMUNITY): Payer: Self-pay | Admitting: Respiratory Therapy

## 2012-11-19 ENCOUNTER — Other Ambulatory Visit: Payer: Self-pay | Admitting: Orthopedic Surgery

## 2012-11-19 NOTE — Pre-Procedure Instructions (Signed)
[name] [name]  11/19/2012   Your procedure is scheduled on:  Monday, Feb 17th   Report to [facility] at 11:00 AM.  Call this number if you have problems the morning of surgery: [phone]   Remember:   Do not eat food or drink liquids after midnight Sunday.   Take these medicines the morning of surgery with A SIP OF WATER:  Xanax, Norvasc, Pain Medication   Do not wear jewelry.  Do not wear lotions, powders, or colognes. You may NOT wear deodorant.   Men may shave face and neck.   Do not bring valuables to the hospital.  Contacts, dentures or bridgework may not be worn into surgery.   Leave suitcase in the car. After surgery it may be brought to your room.  For patients admitted to the hospital, checkout time is 11:00 AM the day of discharge.   Patients discharged the day of surgery will not be allowed to drive home.   Name and phone number of your driver:    Special Instructions: Shower using CHG 2 nights before surgery and the night before surgery.  If you shower the day of surgery use CHG.  Use special wash - you have one bottle of CHG for all showers.  You should use approximately 1/3 of the bottle for each shower.   Please read over the following fact sheets that you were given: Pain Booklet, Coughing and Deep Breathing, MRSA Information and Surgical Site Infection Prevention

## 2012-11-19 NOTE — Progress Notes (Signed)
1615 Monday...Marland KitchenMarland KitchenSpoke with Sherri at Dr. Shelba Flake office regarding getting the orders..."i'll work on it"....DA

## 2012-11-20 ENCOUNTER — Ambulatory Visit (HOSPITAL_COMMUNITY)
Admission: RE | Admit: 2012-11-20 | Discharge: 2012-11-20 | Disposition: A | Discharge status: Home / Self Care | Payer: Medicare Other | Source: Ambulatory Visit | Attending: Orthopedic Surgery | Admitting: Orthopedic Surgery

## 2012-11-20 ENCOUNTER — Encounter (HOSPITAL_COMMUNITY)
Admission: RE | Admit: 2012-11-20 | Discharge: 2012-11-20 | Disposition: A | Discharge status: Home / Self Care | Payer: Medicare Other | Source: Ambulatory Visit | Attending: Orthopedic Surgery | Admitting: Orthopedic Surgery

## 2012-11-20 ENCOUNTER — Encounter (HOSPITAL_COMMUNITY): Payer: Self-pay

## 2012-11-20 DIAGNOSIS — Z01812 Encounter for preprocedural laboratory examination: Secondary | ICD-10-CM | POA: Insufficient documentation

## 2012-11-20 DIAGNOSIS — Z01810 Encounter for preprocedural cardiovascular examination: Secondary | ICD-10-CM | POA: Insufficient documentation

## 2012-11-20 DIAGNOSIS — Z01818 Encounter for other preprocedural examination: Secondary | ICD-10-CM | POA: Insufficient documentation

## 2012-11-20 DIAGNOSIS — R9431 Abnormal electrocardiogram [ECG] [EKG]: Secondary | ICD-10-CM | POA: Insufficient documentation

## 2012-11-20 HISTORY — DX: Unspecified malignant neoplasm of skin, unspecified: C44.90

## 2012-11-20 LAB — URINALYSIS, ROUTINE W REFLEX MICROSCOPIC
Glucose, UA: NEGATIVE mg/dL
Hgb urine dipstick: NEGATIVE
Leukocytes, UA: NEGATIVE
Protein, ur: NEGATIVE mg/dL
Specific Gravity, Urine: 1.01 (ref 1.005–1.030)
Urobilinogen, UA: 0.2 mg/dL (ref 0.0–1.0)

## 2012-11-20 LAB — BASIC METABOLIC PANEL
BUN: 22 mg/dL (ref 6–23)
CO2: 25 mEq/L (ref 19–32)
Calcium: 9.4 mg/dL (ref 8.4–10.5)
Chloride: 103 mEq/L (ref 96–112)
Creatinine, Ser: 0.85 mg/dL (ref 0.50–1.35)
Glucose, Bld: 84 mg/dL (ref 70–99)

## 2012-11-20 LAB — CBC
HCT: 44.4 % (ref 39.0–52.0)
MCH: 31.6 pg (ref 26.0–34.0)
MCV: 96.1 fL (ref 78.0–100.0)
Platelets: 205 10*3/uL (ref 150–400)
RDW: 13.7 % (ref 11.5–15.5)

## 2012-11-20 LAB — TYPE AND SCREEN: ABO/RH(D): A POS

## 2012-11-20 LAB — ABO/RH: ABO/RH(D): A POS

## 2012-11-25 MED ORDER — CEFAZOLIN SODIUM-DEXTROSE 2-3 GM-% IV SOLR
2.0000 g | INTRAVENOUS | Status: AC
  Administered 2012-11-26: 2 g via INTRAVENOUS
  Filled 2012-11-25: qty 50

## 2012-11-26 ENCOUNTER — Encounter (HOSPITAL_COMMUNITY): Payer: Self-pay | Admitting: *Deleted

## 2012-11-26 ENCOUNTER — Encounter (HOSPITAL_COMMUNITY)
Admission: RE | Disposition: A | Discharge status: Home Health | Payer: Self-pay | Source: Ambulatory Visit | Attending: Orthopedic Surgery

## 2012-11-26 ENCOUNTER — Inpatient Hospital Stay (HOSPITAL_COMMUNITY): Payer: Medicare Other

## 2012-11-26 ENCOUNTER — Encounter (HOSPITAL_COMMUNITY): Payer: Self-pay | Admitting: Certified Registered Nurse Anesthetist

## 2012-11-26 ENCOUNTER — Inpatient Hospital Stay (HOSPITAL_COMMUNITY)
Admission: RE | Admit: 2012-11-26 | Discharge: 2012-11-28 | DRG: 470 | Disposition: A | Discharge status: Home Health | Payer: Medicare Other | Source: Ambulatory Visit | Attending: Orthopedic Surgery | Admitting: Orthopedic Surgery

## 2012-11-26 ENCOUNTER — Inpatient Hospital Stay (HOSPITAL_COMMUNITY): Payer: Medicare Other | Admitting: Certified Registered Nurse Anesthetist

## 2012-11-26 DIAGNOSIS — M87059 Idiopathic aseptic necrosis of unspecified femur: Principal | ICD-10-CM | POA: Diagnosis present

## 2012-11-26 DIAGNOSIS — M87051 Idiopathic aseptic necrosis of right femur: Secondary | ICD-10-CM

## 2012-11-26 DIAGNOSIS — I10 Essential (primary) hypertension: Secondary | ICD-10-CM | POA: Diagnosis present

## 2012-11-26 DIAGNOSIS — Z7901 Long term (current) use of anticoagulants: Secondary | ICD-10-CM

## 2012-11-26 DIAGNOSIS — Z8249 Family history of ischemic heart disease and other diseases of the circulatory system: Secondary | ICD-10-CM

## 2012-11-26 DIAGNOSIS — Z79899 Other long term (current) drug therapy: Secondary | ICD-10-CM

## 2012-11-26 HISTORY — PX: TOTAL HIP ARTHROPLASTY: SHX124

## 2012-11-26 HISTORY — DX: Idiopathic aseptic necrosis of right femur: M87.051

## 2012-11-26 LAB — CBC
HCT: 42 % (ref 39.0–52.0)
MCH: 31.5 pg (ref 26.0–34.0)
MCV: 97.9 fL (ref 78.0–100.0)
Platelets: 174 10*3/uL (ref 150–400)
RBC: 4.29 MIL/uL (ref 4.22–5.81)

## 2012-11-26 SURGERY — ARTHROPLASTY, HIP, TOTAL,POSTERIOR APPROACH
Anesthesia: General | Site: Hip | Laterality: Right | Wound class: Clean

## 2012-11-26 MED ORDER — ONDANSETRON HCL 4 MG/2ML IJ SOLN: 4.0000 mg | Freq: Once | INTRAMUSCULAR | Status: DC | PRN

## 2012-11-26 MED ORDER — METOCLOPRAMIDE HCL 5 MG/ML IJ SOLN: 5.0000 mg | Freq: Three times a day (TID) | INTRAMUSCULAR | Status: DC | PRN

## 2012-11-26 MED ORDER — FENTANYL CITRATE 0.05 MG/ML IJ SOLN
INTRAMUSCULAR | Status: AC
  Filled 2012-11-26: qty 2

## 2012-11-26 MED ORDER — WARFARIN SODIUM 5 MG PO TABS: 5.0000 mg | ORAL_TABLET | Freq: Every day | ORAL | Status: DC

## 2012-11-26 MED ORDER — ROCURONIUM BROMIDE 100 MG/10ML IV SOLN
INTRAVENOUS | Status: DC | PRN
  Administered 2012-11-26: 50 mg via INTRAVENOUS

## 2012-11-26 MED ORDER — DIPHENHYDRAMINE HCL 12.5 MG/5ML PO ELIX: 12.5000 mg | ORAL_SOLUTION | ORAL | Status: DC | PRN

## 2012-11-26 MED ORDER — SODIUM CHLORIDE 0.9 % IR SOLN
Status: DC | PRN
  Administered 2012-11-26: 1000 mL

## 2012-11-26 MED ORDER — WARFARIN VIDEO: Freq: Once | Status: DC

## 2012-11-26 MED ORDER — BENAZEPRIL HCL 10 MG PO TABS
10.0000 mg | ORAL_TABLET | Freq: Every day | ORAL | Status: DC
  Administered 2012-11-27 – 2012-11-28 (×2): 10 mg via ORAL
  Filled 2012-11-26 (×2): qty 1

## 2012-11-26 MED ORDER — HYDROMORPHONE HCL PF 1 MG/ML IJ SOLN
INTRAMUSCULAR | Status: AC
  Administered 2012-11-26: 0.5 mg via INTRAVENOUS
  Filled 2012-11-26: qty 1

## 2012-11-26 MED ORDER — METOCLOPRAMIDE HCL 10 MG PO TABS: 5.0000 mg | ORAL_TABLET | Freq: Three times a day (TID) | ORAL | Status: DC | PRN

## 2012-11-26 MED ORDER — PHENYLEPHRINE HCL 10 MG/ML IJ SOLN
INTRAMUSCULAR | Status: DC | PRN
  Administered 2012-11-26 (×4): 40 ug via INTRAVENOUS

## 2012-11-26 MED ORDER — ONDANSETRON HCL 4 MG/2ML IJ SOLN
4.0000 mg | Freq: Four times a day (QID) | INTRAMUSCULAR | Status: DC | PRN
  Filled 2012-11-26: qty 2

## 2012-11-26 MED ORDER — VECURONIUM BROMIDE 10 MG IV SOLR
INTRAVENOUS | Status: DC | PRN
  Administered 2012-11-26 (×2): 2 mg via INTRAVENOUS
  Administered 2012-11-26 (×2): 1 mg via INTRAVENOUS

## 2012-11-26 MED ORDER — VANCOMYCIN HCL IN DEXTROSE 1-5 GM/200ML-% IV SOLN
1000.0000 mg | Freq: Two times a day (BID) | INTRAVENOUS | Status: AC
  Administered 2012-11-27: 1000 mg via INTRAVENOUS
  Filled 2012-11-26: qty 200

## 2012-11-26 MED ORDER — ACETAMINOPHEN 325 MG PO TABS: 650.0000 mg | ORAL_TABLET | Freq: Four times a day (QID) | ORAL | Status: DC | PRN

## 2012-11-26 MED ORDER — ENOXAPARIN SODIUM 40 MG/0.4ML ~~LOC~~ SOLN
40.0000 mg | SUBCUTANEOUS | Status: DC
  Administered 2012-11-27 – 2012-11-28 (×2): 40 mg via SUBCUTANEOUS
  Filled 2012-11-26 (×4): qty 0.4

## 2012-11-26 MED ORDER — KETOROLAC TROMETHAMINE 15 MG/ML IJ SOLN
15.0000 mg | Freq: Four times a day (QID) | INTRAMUSCULAR | Status: AC
  Administered 2012-11-26 – 2012-11-27 (×4): 15 mg via INTRAVENOUS
  Filled 2012-11-26 (×4): qty 1

## 2012-11-26 MED ORDER — PROPOFOL 10 MG/ML IV BOLUS
INTRAVENOUS | Status: DC | PRN
  Administered 2012-11-26: 120 mg via INTRAVENOUS

## 2012-11-26 MED ORDER — MEPERIDINE HCL 25 MG/ML IJ SOLN: 6.2500 mg | INTRAMUSCULAR | Status: DC | PRN

## 2012-11-26 MED ORDER — MIDAZOLAM HCL 5 MG/5ML IJ SOLN
INTRAMUSCULAR | Status: DC | PRN
  Administered 2012-11-26: 2 mg via INTRAVENOUS

## 2012-11-26 MED ORDER — ALUM & MAG HYDROXIDE-SIMETH 200-200-20 MG/5ML PO SUSP: 30.0000 mL | ORAL | Status: DC | PRN

## 2012-11-26 MED ORDER — METHOCARBAMOL 500 MG PO TABS: 500.0000 mg | ORAL_TABLET | Freq: Four times a day (QID) | ORAL | Status: DC

## 2012-11-26 MED ORDER — WARFARIN SODIUM 7.5 MG PO TABS
7.5000 mg | ORAL_TABLET | Freq: Once | ORAL | Status: AC
  Administered 2012-11-26: 7.5 mg via ORAL
  Filled 2012-11-26: qty 1

## 2012-11-26 MED ORDER — OXYCODONE HCL 5 MG PO TABS: 15.0000 mg | ORAL_TABLET | ORAL | Status: DC | PRN

## 2012-11-26 MED ORDER — LIDOCAINE HCL (CARDIAC) 20 MG/ML IV SOLN
INTRAVENOUS | Status: DC | PRN
  Administered 2012-11-26: 100 mg via INTRAVENOUS

## 2012-11-26 MED ORDER — NEOSTIGMINE METHYLSULFATE 1 MG/ML IJ SOLN
INTRAMUSCULAR | Status: DC | PRN
  Administered 2012-11-26: 4 mg via INTRAVENOUS

## 2012-11-26 MED ORDER — FENTANYL CITRATE 0.05 MG/ML IJ SOLN
INTRAMUSCULAR | Status: DC | PRN
  Administered 2012-11-26 (×2): 50 ug via INTRAVENOUS
  Administered 2012-11-26: 200 ug via INTRAVENOUS
  Administered 2012-11-26: 50 ug via INTRAVENOUS

## 2012-11-26 MED ORDER — FENTANYL CITRATE 0.05 MG/ML IJ SOLN
50.0000 ug | Freq: Once | INTRAMUSCULAR | Status: AC
  Administered 2012-11-26: 50 ug via INTRAVENOUS

## 2012-11-26 MED ORDER — METHOCARBAMOL 500 MG PO TABS
500.0000 mg | ORAL_TABLET | Freq: Four times a day (QID) | ORAL | Status: DC | PRN
  Administered 2012-11-27 – 2012-11-28 (×3): 500 mg via ORAL
  Filled 2012-11-26 (×3): qty 1

## 2012-11-26 MED ORDER — METHOCARBAMOL 100 MG/ML IJ SOLN
500.0000 mg | Freq: Four times a day (QID) | INTRAVENOUS | Status: DC | PRN
  Filled 2012-11-26: qty 5

## 2012-11-26 MED ORDER — WARFARIN - PHARMACIST DOSING INPATIENT
Freq: Every day | Status: DC
  Administered 2012-11-27: 18:00:00

## 2012-11-26 MED ORDER — MENTHOL 3 MG MT LOZG
1.0000 | LOZENGE | OROMUCOSAL | Status: DC | PRN
  Filled 2012-11-26: qty 9

## 2012-11-26 MED ORDER — OXYCODONE-ACETAMINOPHEN 10-325 MG PO TABS: 1.0000 | ORAL_TABLET | Freq: Four times a day (QID) | ORAL | Status: DC | PRN

## 2012-11-26 MED ORDER — OXYCODONE HCL 5 MG PO TABS
5.0000 mg | ORAL_TABLET | Freq: Once | ORAL | Status: AC | PRN
  Administered 2012-11-26: 5 mg via ORAL

## 2012-11-26 MED ORDER — OXYCODONE HCL 5 MG/5ML PO SOLN: 5.0000 mg | Freq: Once | ORAL | Status: AC | PRN

## 2012-11-26 MED ORDER — POTASSIUM CHLORIDE IN NACL 20-0.45 MEQ/L-% IV SOLN
INTRAVENOUS | Status: DC
  Administered 2012-11-26 – 2012-11-27 (×2): via INTRAVENOUS
  Filled 2012-11-26 (×5): qty 1000

## 2012-11-26 MED ORDER — HYDROMORPHONE HCL PF 1 MG/ML IJ SOLN
1.0000 mg | INTRAMUSCULAR | Status: DC | PRN
  Administered 2012-11-26 – 2012-11-27 (×4): 1 mg via INTRAVENOUS
  Filled 2012-11-26 (×5): qty 1

## 2012-11-26 MED ORDER — OXYCODONE HCL 5 MG PO TABS
ORAL_TABLET | ORAL | Status: AC
  Administered 2012-11-26: 5 mg via ORAL
  Filled 2012-11-26: qty 1

## 2012-11-26 MED ORDER — COUMADIN BOOK
Freq: Once | Status: AC
  Administered 2012-11-26: 20:00:00
  Filled 2012-11-26: qty 1

## 2012-11-26 MED ORDER — SORBITOL 70 % SOLN: 30.0000 mL | Freq: Every day | Status: DC | PRN

## 2012-11-26 MED ORDER — POLYETHYLENE GLYCOL 3350 17 G PO PACK: 17.0000 g | PACK | Freq: Every day | ORAL | Status: DC | PRN

## 2012-11-26 MED ORDER — LACTATED RINGERS IV SOLN
INTRAVENOUS | Status: DC
  Administered 2012-11-26: 12:00:00 via INTRAVENOUS

## 2012-11-26 MED ORDER — PROMETHAZINE HCL 25 MG PO TABS: 25.0000 mg | ORAL_TABLET | Freq: Four times a day (QID) | ORAL | Status: DC | PRN

## 2012-11-26 MED ORDER — OXYCODONE HCL 5 MG PO TABS
5.0000 mg | ORAL_TABLET | ORAL | Status: DC | PRN
  Administered 2012-11-27: 10 mg via ORAL
  Filled 2012-11-26: qty 2

## 2012-11-26 MED ORDER — HYDROMORPHONE HCL 2 MG PO TABS: 2.0000 mg | ORAL_TABLET | ORAL | Status: DC | PRN

## 2012-11-26 MED ORDER — SENNA-DOCUSATE SODIUM 8.6-50 MG PO TABS: 1.0000 | ORAL_TABLET | Freq: Every day | ORAL | Status: DC

## 2012-11-26 MED ORDER — VANCOMYCIN HCL IN DEXTROSE 1-5 GM/200ML-% IV SOLN
INTRAVENOUS | Status: AC
  Administered 2012-11-26: 1000 mg via INTRAVENOUS
  Filled 2012-11-26: qty 200

## 2012-11-26 MED ORDER — SENNA 8.6 MG PO TABS
1.0000 | ORAL_TABLET | Freq: Two times a day (BID) | ORAL | Status: DC
  Administered 2012-11-26 – 2012-11-28 (×4): 8.6 mg via ORAL
  Filled 2012-11-26 (×5): qty 1

## 2012-11-26 MED ORDER — ACETAMINOPHEN 650 MG RE SUPP: 650.0000 mg | Freq: Four times a day (QID) | RECTAL | Status: DC | PRN

## 2012-11-26 MED ORDER — LACTATED RINGERS IV SOLN
INTRAVENOUS | Status: DC | PRN
  Administered 2012-11-26 (×2): via INTRAVENOUS

## 2012-11-26 MED ORDER — ARTIFICIAL TEARS OP OINT
TOPICAL_OINTMENT | OPHTHALMIC | Status: DC | PRN
  Administered 2012-11-26: 1 via OPHTHALMIC

## 2012-11-26 MED ORDER — HYDROMORPHONE HCL PF 1 MG/ML IJ SOLN
0.2500 mg | INTRAMUSCULAR | Status: DC | PRN
  Administered 2012-11-26 (×3): 0.5 mg via INTRAVENOUS

## 2012-11-26 MED ORDER — ALPRAZOLAM 0.5 MG PO TABS
2.0000 mg | ORAL_TABLET | Freq: Three times a day (TID) | ORAL | Status: DC | PRN
Start: 2012-11-26 — End: 2012-11-28
  Administered 2012-11-27: 2 mg via ORAL
  Filled 2012-11-26: qty 4

## 2012-11-26 MED ORDER — PHENOL 1.4 % MT LIQD: 1.0000 | OROMUCOSAL | Status: DC | PRN

## 2012-11-26 MED ORDER — DOCUSATE SODIUM 100 MG PO CAPS
100.0000 mg | ORAL_CAPSULE | Freq: Two times a day (BID) | ORAL | Status: DC
  Administered 2012-11-26 – 2012-11-28 (×4): 100 mg via ORAL
  Filled 2012-11-26 (×4): qty 1

## 2012-11-26 MED ORDER — GLYCOPYRROLATE 0.2 MG/ML IJ SOLN
INTRAMUSCULAR | Status: DC | PRN
  Administered 2012-11-26: .6 mg via INTRAVENOUS

## 2012-11-26 MED ORDER — ONDANSETRON HCL 4 MG PO TABS: 4.0000 mg | ORAL_TABLET | Freq: Four times a day (QID) | ORAL | Status: DC | PRN

## 2012-11-26 SURGICAL SUPPLY — 63 items
BENZOIN TINCTURE PRP APPL 2/3 (GAUZE/BANDAGES/DRESSINGS) ×2 IMPLANT
BLADE SAW SAG 73X25 THK (BLADE) ×1
BLADE SAW SGTL 73X25 THK (BLADE) ×1 IMPLANT
BLADE SURG 10 STRL SS (BLADE) ×2 IMPLANT
BNDG COHESIVE 6X5 TAN STRL LF (GAUZE/BANDAGES/DRESSINGS) ×2 IMPLANT
BRUSH FEMORAL CANAL (MISCELLANEOUS) IMPLANT
CLOTH BEACON ORANGE TIMEOUT ST (SAFETY) ×2 IMPLANT
CLSR STERI-STRIP ANTIMIC 1/2X4 (GAUZE/BANDAGES/DRESSINGS) ×2 IMPLANT
COVER BACK TABLE 24X17X13 BIG (DRAPES) IMPLANT
COVER SURGICAL LIGHT HANDLE (MISCELLANEOUS) ×2 IMPLANT
DRAPE INCISE IOBAN 66X45 STRL (DRAPES) ×2 IMPLANT
DRAPE ORTHO SPLIT 77X108 STRL (DRAPES) ×2
DRAPE PROXIMA HALF (DRAPES) ×2 IMPLANT
DRAPE SURG ORHT 6 SPLT 77X108 (DRAPES) ×2 IMPLANT
DRAPE U-SHAPE 47X51 STRL (DRAPES) ×2 IMPLANT
DRILL BIT 5/64 (BIT) ×2 IMPLANT
DRSG MEPILEX BORDER 4X12 (GAUZE/BANDAGES/DRESSINGS) IMPLANT
DRSG MEPILEX BORDER 4X8 (GAUZE/BANDAGES/DRESSINGS) IMPLANT
DRSG PAD ABDOMINAL 8X10 ST (GAUZE/BANDAGES/DRESSINGS) ×2 IMPLANT
DURAPREP 26ML APPLICATOR (WOUND CARE) ×2 IMPLANT
ELECT CAUTERY BLADE 6.4 (BLADE) ×2 IMPLANT
ELECT REM PT RETURN 9FT ADLT (ELECTROSURGICAL) ×2
ELECTRODE REM PT RTRN 9FT ADLT (ELECTROSURGICAL) ×1 IMPLANT
EVACUATOR 1/8 PVC DRAIN (DRAIN) IMPLANT
GLOVE BIOGEL PI ORTHO PRO SZ8 (GLOVE) ×2
GLOVE ORTHO TXT STRL SZ7.5 (GLOVE) ×2 IMPLANT
GLOVE PI ORTHO PRO STRL SZ8 (GLOVE) ×2 IMPLANT
GLOVE SURG ORTHO 8.0 STRL STRW (GLOVE) ×4 IMPLANT
GOWN STRL NON-REIN LRG LVL3 (GOWN DISPOSABLE) IMPLANT
HANDPIECE INTERPULSE COAX TIP (DISPOSABLE)
HOOD PEEL AWAY FACE SHEILD DIS (HOOD) ×4 IMPLANT
KIT BASIN OR (CUSTOM PROCEDURE TRAY) ×2 IMPLANT
KIT ROOM TURNOVER OR (KITS) ×2 IMPLANT
MANIFOLD NEPTUNE II (INSTRUMENTS) ×2 IMPLANT
NEEDLE HYPO 25GX1X1/2 BEV (NEEDLE) ×2 IMPLANT
NS IRRIG 1000ML POUR BTL (IV SOLUTION) ×2 IMPLANT
PACK TOTAL JOINT (CUSTOM PROCEDURE TRAY) ×2 IMPLANT
PAD ARMBOARD 7.5X6 YLW CONV (MISCELLANEOUS) ×4 IMPLANT
PILLOW ABDUCTION HIP (SOFTGOODS) ×2 IMPLANT
PRESSURIZER FEMORAL UNIV (MISCELLANEOUS) IMPLANT
RETRIEVER SUT HEWSON (MISCELLANEOUS) ×2 IMPLANT
SET HNDPC FAN SPRY TIP SCT (DISPOSABLE) IMPLANT
SPONGE GAUZE 4X4 12PLY (GAUZE/BANDAGES/DRESSINGS) IMPLANT
SPONGE LAP 4X18 X RAY DECT (DISPOSABLE) IMPLANT
STRIP CLOSURE SKIN 1/2X4 (GAUZE/BANDAGES/DRESSINGS) ×4 IMPLANT
SUCTION FRAZIER TIP 10 FR DISP (SUCTIONS) ×2 IMPLANT
SUT FIBERWIRE #2 38 REV NDL BL (SUTURE) ×6
SUT FIBERWIRE #2 38 T-5 BLUE (SUTURE) ×4
SUT MNCRL AB 4-0 PS2 18 (SUTURE) IMPLANT
SUT VIC AB 0 CT1 27 (SUTURE) ×1
SUT VIC AB 0 CT1 27XBRD ANBCTR (SUTURE) ×1 IMPLANT
SUT VIC AB 2-0 CT1 27 (SUTURE) ×1
SUT VIC AB 2-0 CT1 TAPERPNT 27 (SUTURE) ×1 IMPLANT
SUT VIC AB 3-0 SH 18 (SUTURE) ×4 IMPLANT
SUTURE FIBERWR #2 38 T-5 BLUE (SUTURE) ×2 IMPLANT
SUTURE FIBERWR#2 38 REV NDL BL (SUTURE) ×3 IMPLANT
SYR CONTROL 10ML LL (SYRINGE) ×2 IMPLANT
TAPE CLOTH SURG 4X10 WHT LF (GAUZE/BANDAGES/DRESSINGS) ×2 IMPLANT
TOWEL OR 17X24 6PK STRL BLUE (TOWEL DISPOSABLE) ×2 IMPLANT
TOWEL OR 17X26 10 PK STRL BLUE (TOWEL DISPOSABLE) ×2 IMPLANT
TOWER CARTRIDGE SMART MIX (DISPOSABLE) IMPLANT
TRAY FOLEY CATH 14FR (SET/KITS/TRAYS/PACK) ×2 IMPLANT
WATER STERILE IRR 1000ML POUR (IV SOLUTION) ×8 IMPLANT

## 2012-11-26 NOTE — Plan of Care (Signed)
Problem: Consults Goal: Diagnosis- Total Joint Replacement Primary Total Hip Right     

## 2012-11-26 NOTE — Transfer of Care (Signed)
Immediate Anesthesia Transfer of Care Note  Patient: [name] [name]. [place]  Procedure(s) Performed: Procedure(s): TOTAL HIP ARTHROPLASTY (Right)  Patient Location: PACU  Anesthesia Type:General  Level of Consciousness: awake, alert , oriented and patient cooperative  Airway & Oxygen Therapy: Patient Spontanous Breathing and Patient connected to nasal cannula oxygen  Post-op Assessment: Report given to PACU RN and Post -op Vital signs reviewed and stable  Post vital signs: Reviewed and stable  Complications: No apparent anesthesia complications

## 2012-11-26 NOTE — Progress Notes (Signed)
ANTICOAGULATION CONSULT NOTE - Initial Consult  Pharmacy Consult for Coumadin Indication: VTE prophylaxis s/p R Total Hip Replacement  No Known Allergies  Patient Measurements:   Height = 69 inches Weight = 104.8 kg  Vital Signs: Temp: 98.6 F (37 C) (02/17 1807) Temp src: Oral (02/17 1053) BP: 130/83 mmHg (02/17 1807) Pulse Rate: 93 (02/17 1807)  Labs: Baseline labs (11/20/12) SCr 0.85 Hgb 14.6 PLTC 205 INR 1.01   Medical History: Past Medical History  Diagnosis Date  . Abscess of right hip 04/11/2012  . Nephrolithiasis   . Medial epicondylitis   . Gastric ulcer   . Hypertension   . Skin cancer     20 removed  . 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 ALPRAZolam (XANAX) 1 MG tablet Take 2 mg by mouth 3 (three) times daily as needed for sleep. anxiety      . benazepril (LOTENSIN) 20 MG tablet Take 10 mg by mouth daily.       Marland Kitchen oxyCODONE (ROXICODONE) 15 MG immediate release tablet Take 15 mg by mouth every 4 (four) hours as needed. For pain        Assessment: 67 yo M with hx R hip avascular necrosis is admitted 11/26/2012 for R THR.  Coumadin points = 5.   Goal of Therapy:  INR 2-3   Plan:  Coumadin 7.5 mg PO x 1 tonight. Daily INR. Coumadin education materials ordered.  [employer], Pharm.D., BCPS Clinical Pharmacist Pager [phone] 11/26/2012 7:32 PM

## 2012-11-26 NOTE — Anesthesia Procedure Notes (Signed)
Procedure Name: Intubation Date/Time: 11/26/2012 2:06 PM Performed by: Angelica Pou Pre-anesthesia Checklist: Patient identified, Timeout performed, Emergency Drugs available, Suction available and Patient being monitored Patient Re-evaluated:Patient Re-evaluated prior to inductionOxygen Delivery Method: Circle system utilized Preoxygenation: Pre-oxygenation with 100% oxygen Intubation Type: IV induction Ventilation: Mask ventilation without difficulty and Oral airway inserted - appropriate to patient size Laryngoscope Size: Mac and 4 Grade View: Grade I Tube type: Oral Tube size: 7.5 mm Number of attempts: 1 Airway Equipment and Method: Stylet and Oral airway Placement Confirmation: ETT inserted through vocal cords under direct vision,  breath sounds checked- equal and bilateral and positive ETCO2 Secured at: 22 cm Tube secured with: Tape Dental Injury: Teeth and Oropharynx as per pre-operative assessment

## 2012-11-26 NOTE — Progress Notes (Signed)
Pt. Snoring. RR 10-12.  When I woke patient, he states his pain level at an 8-9 in his right hip.  I clarified his severity of pain in his hip.  He stated his pain level and immediately fell back asleep.  Pt. Positioned comfortably.

## 2012-11-26 NOTE — Anesthesia Postprocedure Evaluation (Signed)
Anesthesia Post Note  Patient: [name] [name]. [place]  Procedure(s) Performed: Procedure(s) (LRB): TOTAL HIP ARTHROPLASTY (Right)  Anesthesia type: general  Patient location: PACU  Post pain: Pain level controlled  Post assessment: Patient's Cardiovascular Status Stable  Last Vitals:  Filed Vitals:   11/26/12 1745  BP: 133/78  Pulse: 97  Temp:   Resp: 10    Post vital signs: Reviewed and stable  Level of consciousness: sedated  Complications: No apparent anesthesia complications

## 2012-11-26 NOTE — Anesthesia Preprocedure Evaluation (Addendum)
Anesthesia Evaluation  Patient identified by MRN, date of birth, ID band Patient awake    Reviewed: Allergy & Precautions, H&P , NPO status , Patient's Chart, lab work & pertinent test results  History of Anesthesia Complications Negative for: history of anesthetic complications  Airway Mallampati: II TM Distance: >3 FB Neck ROM: Full    Dental  (+) Edentulous Upper, Poor Dentition and Dental Advisory Given   Pulmonary shortness of breath and with exertion,  Snores  breath sounds clear to auscultation  Pulmonary exam normal       Cardiovascular hypertension, Pt. on medications Rhythm:Regular     Neuro/Psych    GI/Hepatic PUD, Hx etOH abuse   Endo/Other    Renal/GU      Musculoskeletal   Abdominal   Peds  Hematology   Anesthesia Other Findings   Reproductive/Obstetrics                        Anesthesia Physical Anesthesia Plan  ASA: II  Anesthesia Plan: General   Post-op Pain Management:    Induction: Intravenous  Airway Management Planned: Oral ETT  Additional Equipment:   Intra-op Plan:   Post-operative Plan: Extubation in OR  Informed Consent: I have reviewed the patients History and Physical, chart, labs and discussed the procedure including the risks, benefits and alternatives for the proposed anesthesia with the patient or authorized representative who has indicated his/her understanding and acceptance.     Plan Discussed with: CRNA and Surgeon  Anesthesia Plan Comments:         Anesthesia Quick Evaluation

## 2012-11-26 NOTE — Progress Notes (Signed)
2 bags of clothes returned to family

## 2012-11-26 NOTE — Preoperative (Signed)
Beta Blockers   Reason not to administer Beta Blockers:Not Applicable 

## 2012-11-26 NOTE — Progress Notes (Signed)
SPOKE WITH DR. SINGER RE: LABS AND HE STATED IF PATIENT WAS NOT ON BLOOD THINNERS WE DID NOT NEED PTT.

## 2012-11-26 NOTE — Op Note (Signed)
11/26/2012  4:13 PM  PATIENT:  [name] [name]   MRN: [id number]  PRE-OPERATIVE DIAGNOSIS:  Right hip avascular necrosis  POST-OPERATIVE DIAGNOSIS:  Same  PROCEDURE:  Procedure(s): TOTAL HIP ARTHROPLASTY  PREOPERATIVE INDICATIONS:    [name] [name] is an 67 y.o. male who has a diagnosis of Avascular necrosis of right femoral head and elected for surgical management after failing conservative treatment.  The risks benefits and alternatives were discussed with the patient including but not limited to the risks of nonoperative treatment, versus surgical intervention including infection, bleeding, nerve injury, periprosthetic fracture, the need for revision surgery, dislocation, leg length discrepancy, blood clots, cardiopulmonary complications, morbidity, mortality, among others, and they were willing to proceed.  He did have a previous anterior inguinal abscess that was irrigated and debrided over a year ago. He subsequently recovered, and did not demonstrate any signs of recurrent infection. The infection was superficial. He elected for surgical management of his avascular necrosis, through a posterior approach.   OPERATIVE REPORT     SURGEON:  Teryl Lucy, MD    ASSISTANT:  Janace Litten, OPA-C  (Present throughout the entire procedure,  necessary for completion of procedure in a timely manner, assisting with retraction, instrumentation, and closure)     ANESTHESIA:  General    COMPLICATIONS:  None.     COMPONENTS:  [employer] fit femur size 7 with a 36 mm + 8.5 head ball and a pinnacle acetabular shell size 54 with a 10 degree lipped polyethylene liner    PROCEDURE IN DETAIL:   The patient was met in the holding area and  identified.  The appropriate hip was identified and marked at the operative site.  The patient was then transported to the OR  and  placed under general anesthesia.  At that point, the patient was  placed in the lateral decubitus position with the  operative side up and  secured to the operating room table and all bony prominences padded.     The operative lower extremity was prepped from the iliac crest to the distal leg.  Sterile draping was performed.  Time out was performed prior to incision.      A routine posterolateral approach was utilized via sharp dissection  carried down to the subcutaneous tissue.  Gross bleeders were Bovie coagulated.  The iliotibial band was identified and incised along the length of the skin incision.  Self-retaining retractors were  inserted.  With the hip internally rotated, the short external rotators  were identified. The piriformis and capsule was tagged with FiberWire, and the hip capsule released in a T-type fashion.  The femoral neck was exposed, and I resected the femoral neck using the appropriate jig. This was performed at approximately a thumb's breadth above the lesser trochanter. The femoral head was extremely large, and the neck very long.    I then exposed the deep acetabulum, cleared out any tissue including the ligamentum teres.  A wing retractor was placed.  After adequate visualization, I excised the labrum, and then sequentially reamed.  I reamed to a size 53, And then just gently touched it with a 54.  I placed the trial acetabulum, which seated nicely, and then impacted the real cup into place.  Appropriate version and inclination was confirmed clinically matching their bony anatomy, and also with the use of the jig. The bone quality was not as good as I would've expected.  A trial polyethylene liner was placed and the wing retractor removed.  I then prepared the proximal femur using the cookie-cutter, the lateralizing reamer, and then sequentially reamed and broached.    A trial broach, neck, and head was utilized, and I reduced the hip and initially it was extremely short, and loose, with significant shuck. I had a size 6 broach in, and trialed with the high offset, although he up to a  size 12 neck. Because it appeared that the 12 was the right length, I did not want to maximize the neck, and so I went and move to a size 7 broach, and left this proud, and then trialed with the 8.5. It had excellent stability with functional range of motion. The trial components were then removed, and the real polyethylene liner was placed with the lip directed posteriorly.  I then impacted the real femoral prosthesis into place into the appropriate version, slightly anteverted to the normal anatomy, and I impacted the real head ball into place. I did leave the real prosthesis proud, using the 7 stem, in order to regain height. The hip was then reduced and taken through functional range of motion and found to have excellent stability. Leg lengths were restored.   I then used a 2 mm drill bits to pass the FiberWire suture from the capsule and puriform is through the greater trochanter, and secured this. Excellent posterior capsular repair was achieved. I also closed the T in the capsule.  I then irrigated the hip copiously again with pulse lavage, and repaired the fascia with Vicryl, followed by Vicryl for the subcutaneous tissue, Monocryl for the skin, Steri-Strips and sterile gauze. The wounds were injected. The patient was then awakened and returned to PACU in stable and satisfactory condition. There were no complications.  Teryl Lucy, MD Orthopedic Surgeon [phone]   11/26/2012 4:13 PM

## 2012-11-26 NOTE — H&P (Signed)
  PREOPERATIVE H&P  Chief Complaint: DJD RIGHT HIP  HPI: [name] [name]. [name] [name] is a 67 y.o. male who presents for preoperative history and physical with a diagnosis of DJD RIGHT HIP. Symptoms are rated as moderate to severe, and have been worsening.  This is significantly impairing activities of daily living.  He has elected for surgical management.   Past Medical History  Diagnosis Date  . Abscess of right hip 04/11/2012  . Nephrolithiasis   . Medial epicondylitis   . Gastric ulcer   . Hypertension   . Skin cancer     20 removed   Past Surgical History  Procedure Laterality Date  . I&d extremity  04/14/2012    Procedure: IRRIGATION AND DEBRIDEMENT EXTREMITY;  Surgeon: Eulas Post, MD;  Location: [facility] OR;  Service: Orthopedics;  Laterality: Right;  I & D Hip   History   Social History  . Marital Status: Married    Spouse Name: N/A    Number of Children: N/A  . Years of Education: N/A   Social History Main Topics  . Smoking status: Never Smoker   . Smokeless tobacco: None  . Alcohol Use: No     Comment: stopped 5 year ago  . Drug Use: Yes    Special: "Crack" cocaine  . Sexually Active: None   Other Topics Concern  . None   Social History Narrative  . None   Family History  Problem Relation Age of Onset  . Heart disease Father    No Known Allergies Prior to Admission medications   Medication Sig Start Date End Date Taking? Authorizing Provider  ALPRAZolam Prudy Feeler) 1 MG tablet Take 2 mg by mouth 3 (three) times daily as needed for sleep. anxiety   Yes Historical Provider, MD  benazepril (LOTENSIN) 20 MG tablet Take 10 mg by mouth daily.    Yes Historical Provider, MD  oxyCODONE (ROXICODONE) 15 MG immediate release tablet Take 15 mg by mouth every 4 (four) hours as needed. For pain   Yes Historical Provider, MD     Positive ROS: All other systems have been reviewed and were otherwise negative with the exception of those mentioned in the HPI and as above.  Physical  Exam: General: Alert, no acute distress Cardiovascular: No pedal edema Respiratory: No cyanosis, no use of accessory musculature GI: No organomegaly, abdomen is soft and non-tender Skin: No lesions in the area of chief complaint Neurologic: Sensation intact distally Psychiatric: Patient is competent for consent with normal mood and affect Lymphatic: No axillary or cervical lymphadenopathy  MUSCULOSKELETAL: right hip severe loss of motion with pain.  Assessment: DJD RIGHT HIP  Plan: Plan for Procedure(s): TOTAL HIP ARTHROPLASTY  The risks benefits and alternatives were discussed with the patient including but not limited to the risks of nonoperative treatment, versus surgical intervention including infection, bleeding, nerve injury, periprosthetic fracture, the need for revision surgery, dislocation, leg length discrepancy, blood clots, cardiopulmonary complications, morbidity, mortality, among others, and they were willing to proceed.     Latesia Norrington P, MD Cell [phone] Pager [phone]  11/26/2012 1:36 PM

## 2012-11-27 LAB — CBC
Hemoglobin: 12 g/dL — ABNORMAL LOW (ref 13.0–17.0)
MCH: 31.5 pg (ref 26.0–34.0)
MCV: 97.4 fL (ref 78.0–100.0)
RBC: 3.81 MIL/uL — ABNORMAL LOW (ref 4.22–5.81)
WBC: 9.5 10*3/uL (ref 4.0–10.5)

## 2012-11-27 LAB — BASIC METABOLIC PANEL
CO2: 27 mEq/L (ref 19–32)
Calcium: 8.6 mg/dL (ref 8.4–10.5)
Chloride: 102 mEq/L (ref 96–112)
Creatinine, Ser: 0.98 mg/dL (ref 0.50–1.35)
Glucose, Bld: 113 mg/dL — ABNORMAL HIGH (ref 70–99)

## 2012-11-27 MED ORDER — WARFARIN SODIUM 7.5 MG PO TABS
7.5000 mg | ORAL_TABLET | Freq: Once | ORAL | Status: AC
  Administered 2012-11-27: 7.5 mg via ORAL
  Filled 2012-11-27: qty 1

## 2012-11-27 MED ORDER — OXYCODONE-ACETAMINOPHEN 5-325 MG PO TABS
1.0000 | ORAL_TABLET | Freq: Four times a day (QID) | ORAL | Status: DC | PRN
  Administered 2012-11-27 – 2012-11-28 (×3): 2 via ORAL
  Filled 2012-11-27 (×3): qty 2

## 2012-11-27 MED ORDER — OXYCODONE HCL ER 10 MG PO T12A
10.0000 mg | EXTENDED_RELEASE_TABLET | Freq: Two times a day (BID) | ORAL | Status: DC
  Administered 2012-11-27 – 2012-11-28 (×3): 10 mg via ORAL
  Filled 2012-11-27 (×3): qty 1

## 2012-11-27 MED ORDER — HYDROMORPHONE HCL 2 MG PO TABS
2.0000 mg | ORAL_TABLET | ORAL | Status: DC | PRN
  Administered 2012-11-27: 2 mg via ORAL
  Filled 2012-11-27: qty 1

## 2012-11-27 NOTE — Progress Notes (Signed)
CARE MANAGEMENT NOTE 11/27/2012  Patient:  [name] [name], [name] [name].   Account Number:  [id number]  Date Initiated:  11/27/2012  Documentation initiated by:  Vance Peper  Subjective/Objective Assessment:   68 yr old male s/p right total hip arthroplasty.     Action/Plan:   CM spoke with patient and wife concerning home health and DME needs at discharge. Choice  offered.   Anticipated DC Date:  11/28/2012   Anticipated DC Plan:  HOME W HOME HEALTH SERVICES      DC Planning Services  CM consult      [facility] Choice  HOME HEALTH  DURABLE MEDICAL EQUIPMENT   Choice offered to / List presented to:  C-1 Patient   DME arranged  3-N-1  [facility]      DME agency  [employer]     HH arranged  HH-2 PT  HH-1 RN      [facility] agency  [facility]   Status of service:  Completed, signed off Medicare Important Message given?   (If response is "NO", the following Medicare IM given date fields will be blank) Date Medicare IM given:   Date Additional Medicare IM given:    Discharge Disposition:  HOME W HOME HEALTH SERVICES  Per UR Regulation:    If discussed at Long Length of Stay Meetings, dates discussed:    Comments:

## 2012-11-27 NOTE — Progress Notes (Signed)
ANTICOAGULATION CONSULT NOTE - Initial Consult  Pharmacy Consult for Coumadin Indication: VTE prophylaxis s/p R Total Hip Replacement  No Known Allergies  Patient Measurements:   Height = 69 inches Weight = 104.8 kg  Vital Signs: Temp: 98.7 F (37.1 C) (02/18 0545) Temp src: Oral (02/18 0545) BP: 111/70 mmHg (02/18 0545) Pulse Rate: 98 (02/18 0545)  Labs: Baseline labs (11/20/12) SCr 0.85 Hgb 14.6 PLTC 205 INR 1.01   Medical History: Past Medical History  Diagnosis Date  . Abscess of right hip 04/11/2012  . Nephrolithiasis   . Medial epicondylitis   . Gastric ulcer   . Hypertension   . Skin cancer     20 removed  . 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 ALPRAZolam (XANAX) 1 MG tablet Take 2 mg by mouth 3 (three) times daily as needed for sleep. anxiety      . benazepril (LOTENSIN) 20 MG tablet Take 10 mg by mouth daily.       Marland Kitchen oxyCODONE (ROXICODONE) 15 MG immediate release tablet Take 15 mg by mouth every 4 (four) hours as needed. For pain        Assessment: 67 yo M with hx R hip avascular necrosis is admitted 11/27/2012 for R THR.  Coumadin points = 5. INR today 1.05   Goal of Therapy:  INR 2-3   Plan:  Coumadin 7.5 mg PO x 1 tonight. Daily INR. F/u Coumadin education  [place], [place].D. Clinical Pharmacist Pager [phone] 11/27/2012 11:43 AM

## 2012-11-27 NOTE — Progress Notes (Signed)
UR COMPLETED  

## 2012-11-27 NOTE — Progress Notes (Signed)
Patient ID: [name] [name], male   DOB: [birth year], 67 y.o.   MRN: [id number]     Subjective:  Patient reports pain as mild to moderate.  Denies any CP or SOB.  Sittint up in the chair at time of visit  Objective:   VITALS:   Filed Vitals:   11/26/12 1807 11/26/12 2028 11/27/12 0130 11/27/12 0545  BP: 130/83 119/73 105/63 111/70  Pulse: 93 104 104 98  Temp: 98.6 F (37 C) 98.6 F (37 C) 98.7 F (37.1 C) 98.7 F (37.1 C)  TempSrc:  Oral Oral Oral  Resp: 14 18 18 18   SpO2:  98% 97% 97%    ABD soft Sensation intact distally Dorsiflexion/Plantar flexion intact Incision: dressing C/D/I and no drainage   Lab Results  Component Value Date   WBC 9.5 11/27/2012   HGB 12.0* 11/27/2012   HCT 37.1* 11/27/2012   MCV 97.4 11/27/2012   PLT 155 11/27/2012     Assessment/Plan: 1 Day Post-Op   Principal Problem:   Avascular necrosis of right femoral head   Advance diet Up with therapy Plan for discharge tomorrow if he passes physical therapy and his pain is controlled and he is tolerating a regular diet. Leotis Shames 11/27/2012, 11:49 AM   Teryl Lucy, MD Cell [phone] Pager [phone]

## 2012-11-27 NOTE — Progress Notes (Signed)
Physical Therapy Treatment Patient Details Name: [name] [name] MRN: [id number] DOB: 07/31/1946 Today's Date: 11/27/2012 Time: 4098-1191 PT Time Calculation (min): 39 min  PT Assessment / Plan / Recommendation Comments on Treatment Session  Pt tolerating activity better.  No c/o dizziness. Pt doing well with gait and transfers.  Will work on bed mobilty next session.     Follow Up Recommendations  Home health PT     Does the patient have the potential to tolerate intense rehabilitation     Barriers to Discharge        Equipment Recommendations  Rolling walker with 5" wheels;Other (comment)    Recommendations for Other Services OT consult  Frequency 7X/week   Plan Discharge plan remains appropriate;Frequency remains appropriate    Precautions / Restrictions Precautions Precautions: Posterior Hip Precaution Comments: Pt able to recall 3/3 posterior hip precautions.  Restrictions Weight Bearing Restrictions: Yes RLE Weight Bearing: Weight bearing as tolerated   Pertinent Vitals/Pain Pain 7/10 in R hip.  RN medicated pt at end of session.      Mobility  Bed Mobility Bed Mobility: Supine to Sit;Sit to Supine Supine to Sit: 4: Min assist;With rails;HOB flat Sitting - Scoot to Edge of Bed: 4: Min assist Sit to Supine: 4: Min assist;HOB flat;With rail Details for Bed Mobility Assistance: Assist for RLE secondary to pain and weakness. Verbal and tactile cues to maintain hip precautions.  Transfers Transfers: Sit to Stand;Stand to Sit Sit to Stand: 5: Supervision;From bed;With upper extremity assist Stand to Sit: 5: Supervision;To bed;With upper extremity assist Details for Transfer Assistance: Pt verbalizing safe technique while performing task.  Ambulation/Gait Ambulation/Gait Assistance: 4: Min guard Ambulation Distance (Feet): 100 Feet Assistive device: Rolling walker Ambulation/Gait Assistance Details: VC to increase gait speed.  VCs to discontinue antalgic gait.    Gait Pattern: Step-to pattern;Decreased stride length;Antalgic Gait velocity: slow initally but improved with cueing.      Exercises Total Joint Exercises Heel Slides: Right;10 reps   PT Diagnosis:    PT Problem List:   PT Treatment Interventions:     PT Goals Acute Rehab PT Goals PT Goal Formulation: With patient Time For Goal Achievement: 12/04/12 Potential to Achieve Goals: Good Pt will go Supine/Side to Sit: with modified independence;with HOB 0 degrees PT Goal: Supine/Side to Sit - Progress: Progressing toward goal Pt will go Sit to Supine/Side: with modified independence;with HOB 0 degrees PT Goal: Sit to Supine/Side - Progress: Progressing toward goal Pt will Transfer Bed to Chair/Chair to Bed: with modified independence PT Transfer Goal: Bed to Chair/Chair to Bed - Progress: Progressing toward goal Pt will Ambulate: 51 - 150 feet;with modified independence;with rolling walker PT Goal: Ambulate - Progress: Progressing toward goal Pt will Go Up / Down Stairs: 1-2 stairs;with supervision;with least restrictive assistive device Pt will Perform Home Exercise Program: Independently PT Goal: Perform Home Exercise Program - Progress: Progressing toward goal Additional Goals Additional Goal #1: Pt will recall 3/3 posterior hip precautions.  PT Goal: Additional Goal #1 - Progress: Met  Visit Information  Last PT Received On: 11/27/12 Assistance Needed: +1    Subjective Data  Subjective: My hip is sore Patient Stated Goal: Walk witthout pain.    Cognition  Cognition Overall Cognitive Status: Appears within functional limits for tasks assessed/performed Arousal/Alertness: Awake/alert Orientation Level: Appears intact for tasks assessed Behavior During Session: [facility] for tasks performed    Balance  Balance Balance Assessed: No  End of Session PT - End of Session Equipment Utilized During  Treatment: Gait belt Activity Tolerance: Patient tolerated treatment well Patient  left: in bed;with call bell/phone within reach Nurse Communication: Mobility status   GP     [name] [name] 11/27/2012, 5:56 PM  Kaytlynn Kochan L. Kallee Nam DPT [phone]

## 2012-11-27 NOTE — Progress Notes (Signed)
Physical Therapy Treatment Patient Details Name: [name] [name] MRN: [id number] DOB: 05-20-1946 Today's Date: 11/27/2012 Time: 4098-1191 PT Time Calculation (min): 41 min  PT Assessment / Plan / Recommendation Comments on Treatment Session       Follow Up Recommendations  Home health PT     Does the patient have the potential to tolerate intense rehabilitation     Barriers to Discharge None      Equipment Recommendations  Rolling walker with 5" wheels;Other (comment) (3 in 1 both orderd by MD.  )    Recommendations for Other Services OT consult  Frequency 7X/week   Plan      Precautions / Restrictions Precautions Precautions: Posterior Hip Precaution Comments: Pt and spouse educated in 3/3 posterior hip precautions.  Restrictions Weight Bearing Restrictions: Yes RLE Weight Bearing: Weight bearing as tolerated   Pertinent Vitals/Pain 8/10 pain in hip.  Pt medicated prior to session.     Mobility  Bed Mobility Bed Mobility: Supine to Sit;Sitting - Scoot to Edge of Bed Supine to Sit: 3: Mod assist;HOB flat;With rails Sitting - Scoot to Edge of Bed: 4: Min assist Details for Bed Mobility Assistance: Instructed pt in safe technique maintaining posterior hip precautions. assisst for RLE and assist for trunk flexion secondary to hip pain.   Transfers Transfers: Sit to Stand;Stand to Sit Sit to Stand: 4: Min assist;From bed;With upper extremity assist Stand to Sit: 4: Min assist;To chair/3-in-1;With upper extremity assist Details for Transfer Assistance: VCs for technique including hand placement and positiionng  RLE to maintain posterior hip precautions Ambulation/Gait Ambulation/Gait Assistance: 4: Min guard Ambulation Distance (Feet): 20 Feet Assistive device: Rolling walker Ambulation/Gait Assistance Details: VCs for gait sequencing. Pt became dizzy during gait training and was unable to continue session.   Gait Pattern: Step-to pattern;Decreased stride  length Stairs: No Wheelchair Mobility Wheelchair Mobility: No    Exercises Total Joint Exercises Ankle Circles/Pumps: 10 reps;Both   PT Diagnosis: Difficulty walking;Generalized weakness;Acute pain  PT Problem List: Decreased strength;Decreased activity tolerance;Decreased range of motion;Decreased mobility;Decreased knowledge of use of DME;Pain;Decreased knowledge of precautions;Obesity PT Treatment Interventions: Gait training;DME instruction;Stair training;Functional mobility training;Therapeutic activities;Therapeutic exercise;Patient/family education   PT Goals Acute Rehab PT Goals PT Goal Formulation: With patient Time For Goal Achievement: 12/04/12 Potential to Achieve Goals: Good Pt will go Supine/Side to Sit: with modified independence;with HOB 0 degrees PT Goal: Supine/Side to Sit - Progress: Goal set today Pt will go Sit to Supine/Side: with modified independence;with HOB 0 degrees PT Goal: Sit to Supine/Side - Progress: Goal set today Pt will Transfer Bed to Chair/Chair to Bed: with modified independence PT Transfer Goal: Bed to Chair/Chair to Bed - Progress: Goal set today Pt will Ambulate: 51 - 150 feet;with modified independence;with rolling walker PT Goal: Ambulate - Progress: Goal set today Pt will Go Up / Down Stairs: 1-2 stairs;with supervision;with least restrictive assistive device PT Goal: Up/Down Stairs - Progress: Goal set today Pt will Perform Home Exercise Program: Independently PT Goal: Perform Home Exercise Program - Progress: Goal set today Additional Goals Additional Goal #1: Pt will recall 3/3 posterior hip precautions.  PT Goal: Additional Goal #1 - Progress: Goal set today  Visit Information  Last PT Received On: 11/27/12 Assistance Needed: +1    Subjective Data  Subjective: Agree to PT eval.   Patient Stated Goal: Walk witthout pain.    Cognition  Cognition Overall Cognitive Status: Appears within functional limits for tasks  assessed/performed Arousal/Alertness: Awake/alert Orientation Level: Appears intact for tasks  assessed Behavior During Session: [facility] for tasks performed    Balance  Balance Balance Assessed: No  End of Session PT - End of Session Equipment Utilized During Treatment: Gait belt Activity Tolerance: Treatment limited secondary to medical complications (Comment) (Dizziness ) Patient left: in chair;with call bell/phone within reach Nurse Communication: Mobility status;Other (comment) (dizziness)   GP     [name] [name] 11/27/2012, 1:17 PM Brenden Rudman L. Twinkle Sockwell DPT [phone]

## 2012-11-28 LAB — BASIC METABOLIC PANEL
Calcium: 8.6 mg/dL (ref 8.4–10.5)
Creatinine, Ser: 0.96 mg/dL (ref 0.50–1.35)
GFR calc Af Amer: >90 mL/min (ref >90)
GFR calc non Af Amer: 84 mL/min — ABNORMAL LOW (ref >90)
Sodium: 133 mEq/L — ABNORMAL LOW (ref 135–145)

## 2012-11-28 LAB — CBC
MCH: 31.5 pg (ref 26.0–34.0)
MCHC: 32.8 g/dL (ref 30.0–36.0)
MCV: 96.1 fL (ref 78.0–100.0)
Platelets: 136 10*3/uL — ABNORMAL LOW (ref 150–400)
RDW: 14.1 % (ref 11.5–15.5)

## 2012-11-28 LAB — PROTIME-INR: Prothrombin Time: 19 seconds — ABNORMAL HIGH (ref 11.6–15.2)

## 2012-11-28 NOTE — Evaluation (Signed)
Occupational Therapy Evaluation Patient Details Name: [name] [name]. Werts MRN: [id number] DOB: July 04, 1946 Today's Date: 11/28/2012 Time: 1105-1140 OT Time Calculation (min): 35 min  OT Assessment / Plan / Recommendation Clinical Impression  Pt is a 67 y/o male s/p R THA, he has posterior hip precautions and plans to d/c home later today with PRN assist from his wife. Pt was issued handout on A/E and pt/wife were educated in ADL's for bathing and dressing while adhering to THP & tub/toilet transfers were performed. They both verbalized understanding and did not feel as though he needed further OT at this time. Will sign off OT.    OT Assessment  Patient does not need any further OT services    Follow Up Recommendations  No OT follow up    Barriers to Discharge      Equipment Recommendations  3 in 1 bedside comode;Tub/shower seat    Recommendations for Other Services    Frequency       Precautions / Restrictions Precautions Precautions: Posterior Hip Precaution Comments: Pt able to recall 3/3 posterior hip precautions.  Restrictions Weight Bearing Restrictions: Yes RLE Weight Bearing: Weight bearing as tolerated   Pertinent Vitals/Pain 3/10 R hip "It's sore, but I had pain medicine"    ADL  Eating/Feeding: Simulated;Independent Where Assessed - Eating/Feeding: Bed level Grooming: Performed;Wash/dry hands;Supervision/safety Where Assessed - Grooming: Unsupported standing Upper Body Bathing: Simulated;Set up Where Assessed - Upper Body Bathing: Unsupported sitting;Supported sitting Lower Body Bathing: Simulated;Minimal assistance Where Assessed - Lower Body Bathing: Unsupported sitting;Unsupported sit to stand Upper Body Dressing: Simulated;Set up;Modified independent Where Assessed - Upper Body Dressing: Unsupported sitting Lower Body Dressing: Performed;Minimal assistance Where Assessed - Lower Body Dressing: Supported sit to stand Toilet Transfer:  Research scientist (life sciences) Method: Sit to Barista: Raised toilet seat with arms (or 3-in-1 over toilet) Toileting - Clothing Manipulation and Hygiene: Performed;Supervision/safety Where Assessed - Engineer, mining and Hygiene: Sit to stand from 3-in-1 or toilet;Standing Tub/Shower Transfer: Performed;Minimal assistance Tub/Shower Transfer Method: Science writer: Emergency planning/management officer (RW) Equipment Used: Gait belt;Rolling walker (Tub transfer bench and 3:1 over toilet) Transfers/Ambulation Related to ADLs: Pt is supervision assist during functional mobility and ambulation w/ ocassional VC's for posterior hip precautions during tub/toilet transfer ADL Comments: Pt and pt's wife were educated in posterior hip precautions and how they relate to ADL's and self care. Pt performed tub transfer w/ tub bench w/ Min A, however, pt states that he will initially sponge bathe at home, A/E handout was issued and reviewed as well as ADL's and dressing techniques. Pt wife states that she will assist PRN at home.         :      Visit Information  Last OT Received On: 11/28/12 Assistance Needed: +1    Subjective Data  Subjective: Pt reports that he may go haome later today. Patient Stated Goal: Home w/ spouse when able   Prior Functioning     Home Living Lives With: Spouse Available Help at Discharge: Family;Available 24 hours/day Type of Home: House Home Access: Stairs to enter [employer] of Steps: 3 Entrance Stairs-Rails: None Home Layout: Two level;Able to live on main level with bedroom/bathroom Bathroom Shower/Tub: Tub/shower unit;Curtain Firefighter: Standard Bathroom Accessibility: Yes How Accessible: Accessible via walker Home Adaptive Equipment: None Prior Function Level of Independence: Independent with assistive device(s) Able to Take Stairs?: Yes Driving: Yes Vocation:  Retired Musician: No difficulties Dominant Hand: Right    Vision/Perception Vision -  History Baseline Vision: Wears glasses all the time Patient Visual Report: No change from baseline   Cognition  Cognition Overall Cognitive Status: Appears within functional limits for tasks assessed/performed Arousal/Alertness: Awake/alert Orientation Level: Appears intact for tasks assessed Behavior During Session: [facility] for tasks performed    Extremity/Trunk Assessment Right Upper Extremity Assessment RUE ROM/Strength/Tone: Within functional levels RUE Sensation: WFL - Light Touch RUE Coordination: WFL - gross/fine motor Left Upper Extremity Assessment LUE ROM/Strength/Tone: Within functional levels LUE Sensation: WFL - Light Touch LUE Coordination: WFL - gross/fine motor Right Lower Extremity Assessment RLE ROM/Strength/Tone Deficits: Limited secondary to surgery.  Trunk Assessment Trunk Assessment: Normal     Mobility Bed Mobility Bed Mobility: Supine to Sit Supine to Sit: HOB flat;5: Supervision;4: Min guard;With rails Sitting - Scoot to Edge of Bed: 5: Supervision Details for Bed Mobility Assistance: Min guard Assist for RLE secondary to pain/weakness. Verbal and cues to maintain hip precautions.  Transfers Sit to Stand: 5: Supervision;From bed;Without upper extremity assist;From chair/3-in-1;Other (comment) (from tub transfer bench) Stand to Sit: 5: Supervision;To bed;Without upper extremity assist;To chair/3-in-1;Other (comment) (tub transfer bench) Details for Transfer Assistance: Pt verbalizing safe technique while performing task.             End of Session OT - End of Session Equipment Utilized During Treatment: Gait belt;Other (comment) (RW, 3:1 over toilet, tub transfer bench in ADL bathroom, A/E) Activity Tolerance: Patient tolerated treatment well Patient left: in chair;Other (comment) (w/ P.T. in ADL gym, further ed to pt wife in pt rm)  GO      Alm Bustard 11/28/2012, 1:37 PM

## 2012-11-28 NOTE — Progress Notes (Signed)
ANTICOAGULATION CONSULT NOTE - Follow Up Consult  Pharmacy Consult for Coumadin Indication: VTE prophylaxis s/p R Total Hip Replacement  No Known Allergies  Patient Measurements:   Height = 69 inches Weight = 104.8 kg  Vital Signs: Temp: 98.2 F (36.8 C) (02/19 0500) BP: 140/68 mmHg (02/19 0500) Pulse Rate: 86 (02/19 0500)  Labs: Baseline labs (11/20/12) SCr 0.85 Hgb 14.6 PLTC 205 INR 1.01   Medical History: Past Medical History  Diagnosis Date  . Abscess of right hip 04/11/2012  . Nephrolithiasis   . Medial epicondylitis   . Gastric ulcer   . Hypertension   . Skin cancer     20 removed  . 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 ALPRAZolam (XANAX) 1 MG tablet Take 2 mg by mouth 3 (three) times daily as needed for sleep. anxiety      . benazepril (LOTENSIN) 20 MG tablet Take 10 mg by mouth daily.       Marland Kitchen oxyCODONE (ROXICODONE) 15 MG immediate release tablet Take 15 mg by mouth every 4 (four) hours as needed. For pain        Assessment: 67 yo M with hx R hip avascular necrosis is admitted  for R THR.  Coumadin points = 5. INR today 1.65   Goal of Therapy:  INR 2-3   Plan:  Coumadin 5 mg PO x 1 tonight. Daily INR.   Talbert Cage, Pharm.D. Clinical Pharmacist Pager [phone] 11/28/2012 1:07 PM

## 2012-11-28 NOTE — Progress Notes (Signed)
PHYSICAL THERAPY PROGRESS NOTE:  11/28/12 1130  PT Visit Information  Last PT Received On 11/28/12  Assistance Needed +1  PT Time Calculation  PT Start Time 1130  PT Stop Time 1155  PT Time Calculation (min) 25 min  Subjective Data  Subjective My hip is sore  Precautions  Precautions Posterior Hip  Precaution Comments Pt able to recall 3/3 posterior hip precautions.   Restrictions  Weight Bearing Restrictions Yes  RLE Weight Bearing WBAT  Cognition  Overall Cognitive Status Appears within functional limits for tasks assessed/performed  Arousal/Alertness Awake/alert  Orientation Level Appears intact for tasks assessed  Behavior During Session [facility] for tasks performed  Bed Mobility  Bed Mobility Not assessed  Transfers  Transfers Sit to Stand;Stand to Sit  Sit to Stand 6: Modified independent (Device/Increase time);From bed;From chair/3-in-1;With upper extremity assist  Stand to Sit 6: Modified independent (Device/Increase time);To chair/3-in-1;To bed  Details for Transfer Assistance pt demonstrating safe technique.   Ambulation/Gait  Ambulation/Gait Assistance 6: Modified independent (Device/Increase time)  Ambulation Distance (Feet) 150 Feet  Assistive device Rolling walker  Ambulation/Gait Assistance Details pt demonstrating safety and independence.   Gait Pattern Step-to pattern;Decreased stride length;Antalgic  Balance  Balance Assessed No  PT - End of Session  Equipment Utilized During Treatment Gait belt  Activity Tolerance Patient tolerated treatment well  Patient left in chair;with call bell/phone within reach;with family/visitor present  Nurse Communication Mobility status  PT - Assessment/Plan  Comments on Treatment Session Instructed pt on proper technique to enter/exit a car.  Pt doing well with mobility.  Will see once more to review HEP  PT Plan Discharge plan remains appropriate;Frequency remains appropriate  PT Frequency 7X/week  Follow Up Recommendations  Home health PT  PT equipment Rolling walker with 5" wheels;Other (comment)  Acute Rehab PT Goals  PT Goal Formulation With patient  Time For Goal Achievement 12/04/12  Potential to Achieve Goals Good  Pt will go Supine/Side to Sit with modified independence;with HOB 0 degrees  PT Goal: Supine/Side to Sit - Progress Met  Pt will go Sit to Supine/Side with modified independence;with HOB 0 degrees  PT Goal: Sit to Supine/Side - Progress Met  Pt will Transfer Bed to Chair/Chair to Bed with modified independence  PT Transfer Goal: Bed to Chair/Chair to Bed - Progress Met  Pt will Ambulate 51 - 150 feet;with modified independence;with rolling walker  PT Goal: Ambulate - Progress Met  Pt will Go Up / Down Stairs 1-2 stairs;with supervision;with least restrictive assistive device  PT Goal: Up/Down Stairs - Progress Met  Pt will Perform Home Exercise Program Independently  PT Goal: Perform Home Exercise Program - Progress Progressing toward goal  Additional Goals  Additional Goal #1 Pt will recall 3/3 posterior hip precautions.   PT Goal: Additional Goal #1 - Progress Met  PT General Charges  $$ ACUTE PT VISIT 1 Procedure  PT Treatments  $Gait Training 8-22 mins  $Therapeutic Activity 8-22 mins  Shyheim Tanney L. Anneth Brunell DPT [phone]

## 2012-11-28 NOTE — Progress Notes (Deleted)
PHYSICAL THERAPY PROGRESS NOTE     11/28/12 0800   PT Visit Information   Last PT Received On  11/28/12   Assistance Needed  +1   PT Time Calculation   PT Start Time  0830   PT Stop Time  0924   PT Time Calculation (min)  54 min   Subjective Data   Subjective  I feel good today   Patient Stated Goal  Walk witthout pain.   Precautions   Precautions  Posterior Hip   Precaution Booklet Issued  Yes (comment)   Precaution Comments  Pt able to recall 3/3 posterior hip precautions.   Restrictions   Weight Bearing Restrictions  Yes   RLE Weight Bearing  WBAT   Cognition   Overall Cognitive Status  Appears within functional limits for tasks assessed/performed   Arousal/Alertness  Awake/alert   Orientation Level  Appears intact for tasks assessed   Behavior During Session  WFL for tasks performed   Bed Mobility   Bed Mobility  Supine to Sit;Sit to Supine   Supine to Sit  6: Modified independent (Device/Increase time);HOB flat (with leg lifter)   Sitting - Scoot to Edge of Bed  6: Modified independent (Device/Increase time)   Sit to Supine  6: Modified independent (Device/Increase time);HOB flat (with leg lifter. )   Details for Bed Mobility Assistance  Instructed pt to manage LLE with belt as leg lifter. Pt reports increased pain in L hip last night when attempting to abduct his LLE into bed. Instructed pt to enter on opposite side of the bed adducting his LLE into bed with support of UEs. NO pain with second method.   Transfers   Transfers  Sit to Stand;Stand to Sit   Sit to Stand  6: Modified independent (Device/Increase time);From bed;From chair/3-in-1;With upper extremity assist   Stand to Sit  6: Modified independent (Device/Increase time);To bed;To chair/3-in-1;With upper extremity assist   Details for Transfer Assistance  Pt demonstrating safety and independent technique.   Ambulation/Gait   Ambulation/Gait Assistance  6: Modified independent (Device/Increase time)   Ambulation  Distance (Feet)  200 Feet   Assistive device  Rolling walker   Ambulation/Gait Assistance Details  Gait speed limited by c/o tightness in L hip.   Gait Pattern  Step-to pattern;Decreased stride length;Antalgic   Gait velocity  slow initally but improved with cueing.   Stairs  No   Wheelchair Mobility   Wheelchair Mobility  No   Balance   Balance Assessed  No   PT - End of Session   Equipment Utilized During Treatment  Gait belt   Activity Tolerance  Patient tolerated treatment well   Patient left  in chair;with call bell/phone within reach   Nurse Communication  Mobility status   PT - Assessment/Plan   Comments on Treatment Session  Pt doing well with mobility . Will see once more to review HEP.   PT Plan  Discharge plan remains appropriate;Frequency remains appropriate   PT Frequency  7X/week   Recommendations for Other Services  OT consult   Follow Up Recommendations  Home health PT   PT equipment  Rolling walker with 5" wheels;Other (comment)   Acute Rehab PT Goals   PT Goal Formulation  With patient   Time For Goal Achievement  12/04/12   Potential to Achieve Goals  Good   Pt will go Supine/Side to Sit  with modified independence;with HOB 0 degrees   PT Goal: Supine/Side to Sit - Progress  Met     Pt will go Sit to Supine/Side  with modified independence;with HOB 0 degrees   PT Goal: Sit to Supine/Side - Progress  Met   Pt will Transfer Bed to Chair/Chair to Bed  with modified independence   PT Transfer Goal: Bed to Chair/Chair to Bed - Progress  Met   Pt will Ambulate  51 - 150 feet;with modified independence;with rolling walker   PT Goal: Ambulate - Progress  Met   Pt will Go Up / Down Stairs  1-2 stairs;with supervision;with least restrictive assistive device   PT Goal: Up/Down Stairs - Progress  Met   Pt will Perform Home Exercise Program  Independently   PT Goal: Perform Home Exercise Program - Progress  Progressing toward goal   Additional Goals   Additional Goal #1  Pt  will recall 3/3 posterior hip precautions.   PT Goal: Additional Goal #1 - Progress  Met   PT General Charges   $$ ACUTE PT VISIT  1 Procedure   PT Treatments   $Gait Training  23-37 mins   $Therapeutic Activity  23-37 mins   Lindie Roberson L. Tacie Mccuistion DPT [phone]  

## 2012-11-28 NOTE — Discharge Summary (Signed)
Physician Discharge Summary  Patient ID: [name] [name] MRN: [id number] DOB/AGE: 03/23/1946 67 y.o.  Admit date: 11/26/2012 Discharge date: 11/28/2012  Admission Diagnoses:  Avascular necrosis of right femoral head  Discharge Diagnoses:  Principal Problem:   Avascular necrosis of right femoral head   Past Medical History  Diagnosis Date  . Abscess of right hip 04/11/2012  . Nephrolithiasis   . Medial epicondylitis   . Gastric ulcer   . Hypertension   . Skin cancer     20 removed  . Avascular necrosis of right femoral head 11/26/2012    Surgeries: Procedure(s): TOTAL HIP ARTHROPLASTY on 11/26/2012   Consultants (if any):    Discharged Condition: Improved  Hospital Course: [name] [name] is an 67 y.o. male who was admitted 11/26/2012 with a diagnosis of Avascular necrosis of right femoral head and went to the operating room on 11/26/2012 and underwent the above named procedures.    He was given perioperative antibiotics:  Anti-infectives   Start     Dose/Rate Route Frequency Ordered Stop   11/27/12 0200  vancomycin (VANCOCIN) IVPB 1000 mg/200 mL premix     1,000 mg 200 mL/hr over 60 Minutes Intravenous Every 12 hours 11/26/12 1840 11/27/12 0245   11/26/12 1354  vancomycin (VANCOCIN) 1 GM/200ML IVPB    Comments:  SMITH, JENNIFER: cabinet override      11/26/12 1354 11/26/12 1415   11/26/12 0600  ceFAZolin (ANCEF) IVPB 2 g/50 mL premix     2 g 100 mL/hr over 30 Minutes Intravenous On call to O.R. 11/25/12 1325 11/26/12 1410    .  He was given sequential compression devices, early ambulation, and lovenox bridging to coumadin for DVT prophylaxis.  He benefited maximally from the hospital stay and there were no complications.    Recent vital signs:  Filed Vitals:   11/28/12 0500  BP: 140/68  Pulse: 86  Temp: 98.2 F (36.8 C)  Resp: 20    Recent laboratory studies:  Lab Results  Component Value Date   HGB 10.6* 11/28/2012   HGB 12.0* 11/27/2012   HGB 13.5  11/26/2012   Lab Results  Component Value Date   WBC 8.5 11/28/2012   PLT 136* 11/28/2012   Lab Results  Component Value Date   INR 1.65* 11/28/2012   Lab Results  Component Value Date   NA 133* 11/28/2012   K 4.4 11/28/2012   CL 100 11/28/2012   CO2 27 11/28/2012   BUN 17 11/28/2012   CREATININE 0.96 11/28/2012   GLUCOSE 111* 11/28/2012    Discharge Medications:     Medication List    TAKE these medications       ALPRAZolam 1 MG tablet  Commonly known as:  XANAX  Take 2 mg by mouth 3 (three) times daily as needed for sleep. anxiety     benazepril 20 MG tablet  Commonly known as:  LOTENSIN  Take 10 mg by mouth daily.     HYDROmorphone 2 MG tablet  Commonly known as:  DILAUDID  Take 1 tablet (2 mg total) by mouth every 4 (four) hours as needed for pain.     methocarbamol 500 MG tablet  Commonly known as:  ROBAXIN  Take 1 tablet (500 mg total) by mouth 4 (four) times daily.     oxyCODONE 15 MG immediate release tablet  Commonly known as:  ROXICODONE  Take 15 mg by mouth every 4 (four) hours as needed. For pain     oxyCODONE-acetaminophen 10-325 MG  per tablet  Commonly known as:  PERCOCET  Take 1-2 tablets by mouth every 6 (six) hours as needed for pain. MAXIMUM TOTAL ACETAMINOPHEN DOSE IS 4000 MG PER DAY     promethazine 25 MG tablet  Commonly known as:  PHENERGAN  Take 1 tablet (25 mg total) by mouth every 6 (six) hours as needed for nausea.     sennosides-docusate sodium 8.6-50 MG tablet  Commonly known as:  SENOKOT-S  Take 1 tablet by mouth daily.     warfarin 5 MG tablet  Commonly known as:  COUMADIN  Take 1 tablet (5 mg total) by mouth daily.        Diagnostic Studies: Dg Chest 2 View  11/20/2012  *RADIOLOGY REPORT*  Clinical Data: Preadmission respiratory films. Patient for right hip replacement.  CHEST - 2 VIEW  Comparison: None.  Findings: There is elevation of the right hemidiaphragm.  Lungs are clear.  Heart size is normal.  No pneumothorax or pleural  effusion.  IMPRESSION: No acute disease.   Original Report Authenticated By: Holley Dexter, M.D.    Dg Pelvis Portable  11/26/2012  *RADIOLOGY REPORT*  Clinical Data: Right-sided hip replacement  PORTABLE PELVIS  Comparison: Preoperative  Findings:  Total hip replacement is performed on the right. Components appear well positioned.  No radiographically detectable complication.  Tip of the femoral stem is not included.  IMPRESSION: Total hip replacement on the right.  No complication evident.   Original Report Authenticated By: Paulina Fusi, M.D.    Mr Hip Right Wo Contrast  11/04/2012  *RADIOLOGY REPORT*  Clinical Data: Chronic right hip pain, worsening over the past year.  History of hip infection  MRI OF THE RIGHT HIP WITHOUT CONTRAST  Technique:  Multiplanar, multisequence MR imaging was performed. No intravenous contrast was administered.  Comparison: Multiple exams, including 04/12/2012 and 03/13/2012  Findings: There are new findings of bilateral hip avascular necrosis.  On the right side, characteristic serpentine low T1-T2 signal noted along the upper articular surface of the femoral head, with slight cortical surface irregularity/collapse, but with some evidence of revascularization of the region of AVN.  Abnormal osseous edema is present in the right femoral head, especially laterally, and tracking into the femoral neck. The right hip effusion is present.  There are also foci of abnormal subcortical edema in the right acetabulum, particularly anteriorly.  Geographic region of avascular necrosis in the left femoral head is likewise new compared to prior CT and MRI, with evidence of internal revascularization and without surrounding marrow edema or hip effusion on the left.  Left acetabulum unremarkable.  Proximal hamstring tendons normal.  Pubic rami unremarkable. Visualized sacroiliac joints within normal limits.  A hemangioma is present in the L4 vertebral body.  No impingement at the sciatic  notch.  No obturator impingement.  IMPRESSION:  1.  Interval development of bilateral hip avascular necrosis.  On the right side there is a suggestion of early femoral head cortical collapse, with subtle cortical irregularity, and with edema in the right femoral head and neck as well as a right hip effusion. The hip effusion is most attributable to the avascular necrosis, but if the patient has clinical symptoms/signs of infection then arthrocentesis would be warranted. 2.  No significant marrow edema associated with the LEFT hip avascular necrosis. 3.  Subcortical edema in the right acetabulum has some features similar to the prior examination, although appears mildly increased.  Some of this may represent reactive edema.   Original Report Authenticated By:  Gaylyn Rong, M.D.    Dg Hip Portable 1 View Right  11/26/2012  *RADIOLOGY REPORT*  Clinical Data: Total hip replacement  PORTABLE RIGHT HIP - 1 VIEW  Comparison: Same day  Findings: Lateral view shows the femoral stent to be well positioned without evidence of complication.  IMPRESSION: Good appearance on the lateral view.   Original Report Authenticated By: Paulina Fusi, M.D.     Disposition: 01-Home or Self Care      Discharge Orders   Future Orders Complete By Expires     Call MD / Call 911  As directed     Comments:      If you experience chest pain or shortness of breath, CALL 911 and be transported to the hospital emergency room.  If you develope a fever above 101 F, pus (white drainage) or increased drainage or redness at the wound, or calf pain, call your surgeon's office.    Change dressing  As directed     Comments:      You may change your dressing in 3 days, then change the dressing daily with sterile 4 x 4 inch gauze dressing and paper tape.  You may clean the incision with alcohol prior to redressing    Constipation Prevention  As directed     Comments:      Drink plenty of fluids.  Prune juice may be helpful.  You may use  a stool softener, such as Colace (over the counter) 100 mg twice a day.  Use MiraLax (over the counter) for constipation as needed.    Diet general  As directed     Discharge instructions  As directed     Comments:      Change dressing in 3 days and reapply fresh dressing, unless you have a splint (half cast).  If you have a splint/cast, just leave in place until your follow-up appointment.    Keep wounds dry for 3 weeks.  Leave steri-strips in place on skin.  Do not apply lotion or anything to the wound.    Follow the hip precautions as taught in Physical Therapy  As directed     Posterior total hip precautions  As directed     TED hose  As directed     Comments:      Use stockings (TED hose) for 2 weeks on both leg(s).  You may remove them at night for sleeping.    Weight bearing as tolerated  As directed        Follow-up Information   Follow up with Eulas Post, MD. Schedule an appointment as soon as possible for a visit in 2 weeks.   Contact information:   [address] [place] [address] [phone]        Signed: Eulas Post 11/28/2012, 8:30 AM

## 2012-11-29 ENCOUNTER — Encounter (HOSPITAL_COMMUNITY): Payer: Self-pay | Admitting: Orthopedic Surgery

## 2013-09-07 ENCOUNTER — Encounter (HOSPITAL_COMMUNITY): Payer: Self-pay | Admitting: Emergency Medicine

## 2013-09-07 ENCOUNTER — Emergency Department (HOSPITAL_COMMUNITY)
Admission: EM | Admit: 2013-09-07 | Discharge: 2013-09-07 | Disposition: A | Discharge status: Home / Self Care | Payer: Medicare Other | Attending: Emergency Medicine | Admitting: Emergency Medicine

## 2013-09-07 ENCOUNTER — Emergency Department (HOSPITAL_COMMUNITY): Payer: Medicare Other

## 2013-09-07 DIAGNOSIS — Y929 Unspecified place or not applicable: Secondary | ICD-10-CM | POA: Insufficient documentation

## 2013-09-07 DIAGNOSIS — S93401A Sprain of unspecified ligament of right ankle, initial encounter: Secondary | ICD-10-CM

## 2013-09-07 DIAGNOSIS — S20219A Contusion of unspecified front wall of thorax, initial encounter: Secondary | ICD-10-CM | POA: Insufficient documentation

## 2013-09-07 DIAGNOSIS — W19XXXA Unspecified fall, initial encounter: Secondary | ICD-10-CM

## 2013-09-07 DIAGNOSIS — Z87442 Personal history of urinary calculi: Secondary | ICD-10-CM | POA: Insufficient documentation

## 2013-09-07 DIAGNOSIS — Z79899 Other long term (current) drug therapy: Secondary | ICD-10-CM | POA: Insufficient documentation

## 2013-09-07 DIAGNOSIS — S93409A Sprain of unspecified ligament of unspecified ankle, initial encounter: Secondary | ICD-10-CM | POA: Insufficient documentation

## 2013-09-07 DIAGNOSIS — I10 Essential (primary) hypertension: Secondary | ICD-10-CM | POA: Insufficient documentation

## 2013-09-07 DIAGNOSIS — Z85828 Personal history of other malignant neoplasm of skin: Secondary | ICD-10-CM | POA: Insufficient documentation

## 2013-09-07 DIAGNOSIS — Z8719 Personal history of other diseases of the digestive system: Secondary | ICD-10-CM | POA: Insufficient documentation

## 2013-09-07 DIAGNOSIS — Z8739 Personal history of other diseases of the musculoskeletal system and connective tissue: Secondary | ICD-10-CM | POA: Insufficient documentation

## 2013-09-07 DIAGNOSIS — S4980XA Other specified injuries of shoulder and upper arm, unspecified arm, initial encounter: Secondary | ICD-10-CM | POA: Insufficient documentation

## 2013-09-07 DIAGNOSIS — R42 Dizziness and giddiness: Secondary | ICD-10-CM | POA: Insufficient documentation

## 2013-09-07 DIAGNOSIS — Z7901 Long term (current) use of anticoagulants: Secondary | ICD-10-CM | POA: Insufficient documentation

## 2013-09-07 DIAGNOSIS — Y9389 Activity, other specified: Secondary | ICD-10-CM | POA: Insufficient documentation

## 2013-09-07 DIAGNOSIS — W108XXA Fall (on) (from) other stairs and steps, initial encounter: Secondary | ICD-10-CM | POA: Insufficient documentation

## 2013-09-07 DIAGNOSIS — S20212A Contusion of left front wall of thorax, initial encounter: Secondary | ICD-10-CM

## 2013-09-07 DIAGNOSIS — IMO0002 Reserved for concepts with insufficient information to code with codable children: Secondary | ICD-10-CM | POA: Insufficient documentation

## 2013-09-07 DIAGNOSIS — Z872 Personal history of diseases of the skin and subcutaneous tissue: Secondary | ICD-10-CM | POA: Insufficient documentation

## 2013-09-07 DIAGNOSIS — S46909A Unspecified injury of unspecified muscle, fascia and tendon at shoulder and upper arm level, unspecified arm, initial encounter: Secondary | ICD-10-CM | POA: Insufficient documentation

## 2013-09-07 MED ORDER — HYDROMORPHONE HCL PF 1 MG/ML IJ SOLN
1.0000 mg | Freq: Once | INTRAMUSCULAR | Status: AC
  Administered 2013-09-07: 1 mg via INTRAMUSCULAR
  Filled 2013-09-07: qty 1

## 2013-09-07 NOTE — ED Notes (Signed)
Pt reports has had dizziness off and on for years.  Reports has been dizzy the past few days.  Pt says accidentally fell yesterday down 5 steps.  C/O pain to back, left shoulder, arm, and r ankle.  Pt ambulatory.

## 2013-09-07 NOTE — ED Notes (Signed)
Pt standing at bedside at this time without assistance to obtain urine. Kalin Amrhein

## 2013-09-07 NOTE — ED Notes (Addendum)
Pt c/o right ankle pain and left upper back pain after a fall yesterday. Pt reports chronic intermittent dizziness. Pt denies hitting head. Denies LOC.

## 2013-09-07 NOTE — ED Provider Notes (Signed)
CSN: [id number]     Arrival date & time 09/07/13  1130 History  This chart was scribed for Geoffery Lyons, MD,  by Ashley Jacobs, ED Scribe. The patient was seen in room APA11/APA11 and the patient's care was started at 12:02 PM.  First MD Initiated Contact with Patient 09/07/13 1159     Chief Complaint  Patient presents with  . Fall  . Dizziness   (Consider location/radiation/quality/duration/timing/severity/associated sxs/prior Treatment) The history is provided by the patient and medical records. No language interpreter was used.   HPI Comments: Dorris Vangorder. Caspers is a 67 y.o. male who presents to the Emergency Department complaining of dizziness that caused him to fall last night. Pt states while going to the bathroom he fell down four steps causing him to fall foward. He denies LOC or head injury . He reports the dizziness is fairly baseline and chronic for years.  Pt reports left arm, right posterior, costal right ankle and shoulder pain. The pain is worse on his left side and more painful when he breaths or while riding in a car. He has tried pain medication last night.  He denies any gait problems. Pt had a hip replacement. He denies abdominal and neck pain. Past Medical History  Diagnosis Date  . Abscess of right hip 04/11/2012  . Nephrolithiasis   . Medial epicondylitis   . Gastric ulcer   . Hypertension   . Skin cancer     20 removed  . 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 I&d extremity  04/14/2012    Procedure: IRRIGATION AND DEBRIDEMENT EXTREMITY;  Surgeon: Eulas Post, MD;  Location: [facility] OR;  Service: Orthopedics;  Laterality: Right;  I & D Hip  . Total hip arthroplasty Right 11/26/2012    Procedure: TOTAL HIP ARTHROPLASTY;  Surgeon: Eulas Post, MD;  Location: [facility] OR;  Service: Orthopedics;  Laterality: Right;   Family History  Problem Relation Age of Onset  . Heart disease Father    History   Substance Use Topics  . Smoking status: Never Smoker   . Smokeless tobacco: Not on file  . Alcohol Use: No     Comment: stopped 5 year ago    Review of Systems  Musculoskeletal: Positive for arthralgias, back pain and myalgias. Negative for gait problem, joint swelling and neck pain.  Neurological: Positive for dizziness.  Psychiatric/Behavioral: Negative for confusion.  All other systems reviewed and are negative.    Allergies  Review of patient's allergies indicates no known allergies.  Home Medications   Current Outpatient Rx  Name  Route  Sig  Dispense  Refill  . ALPRAZolam (XANAX) 1 MG tablet   Oral   Take 2 mg by mouth 3 (three) times daily as needed for sleep. anxiety         . benazepril (LOTENSIN) 20 MG tablet   Oral   Take 10 mg by mouth daily.          Marland Kitchen HYDROmorphone (DILAUDID) 2 MG tablet   Oral   Take 1 tablet (2 mg total) by mouth every 4 (four) hours as needed for pain.   50 tablet   0   . methocarbamol (ROBAXIN) 500 MG tablet   Oral   Take 1 tablet (500 mg total) by mouth 4 (four) times daily.   75 tablet   1   . oxyCODONE (ROXICODONE) 15 MG immediate release tablet   Oral  Take 15 mg by mouth every 4 (four) hours as needed. For pain         . oxyCODONE-acetaminophen (PERCOCET) 10-325 MG per tablet   Oral   Take 1-2 tablets by mouth every 6 (six) hours as needed for pain. MAXIMUM TOTAL ACETAMINOPHEN DOSE IS 4000 MG PER DAY   75 tablet   0   . promethazine (PHENERGAN) 25 MG tablet   Oral   Take 1 tablet (25 mg total) by mouth every 6 (six) hours as needed for nausea.   30 tablet   0   . sennosides-docusate sodium (SENOKOT-S) 8.6-50 MG tablet   Oral   Take 1 tablet by mouth daily.   30 tablet   1   . warfarin (COUMADIN) 5 MG tablet   Oral   Take 1 tablet (5 mg total) by mouth daily.   30 tablet   1     Exact dose to be managed based on protocol by Home ...    BP 115/77  Pulse 101  Temp(Src) 97.8 F (36.6 C) (Oral)   Resp 20  Ht 5\' 9"  (1.753 m)  Wt 205 lb (92.987 kg)  BMI 30.26 kg/m2  SpO2 97% Physical Exam  Nursing note and vitals reviewed. Constitutional: He is oriented to person, place, and time. He appears well-developed and well-nourished. No distress.  HENT:  Head: Normocephalic and atraumatic.  Eyes: EOM are normal. Pupils are equal, round, and reactive to light.  Neck: Normal range of motion. Neck supple. No tracheal deviation present.  Cardiovascular: Normal rate.   Pulmonary/Chest: Effort normal. No respiratory distress.  Abdominal: Soft. He exhibits no distension.  Musculoskeletal: He exhibits edema and tenderness.  Right ankle is tender and presents with mild edema to inferior lateral malleolus.   Abrasions to the right forearm and left hand  Good ROM No deformities   Neurological: He is alert and oriented to person, place, and time.  Skin: Skin is warm and dry.  Psychiatric: He has a normal mood and affect. His behavior is normal.    ED Course  Procedures (including critical care time) DIAGNOSTIC STUDIES: Oxygen Saturation is 97% on room air, normal by my interpretation.    COORDINATION OF CARE: 12:06 PM Discussed course of care with pt . Pt understands and agrees.  Labs Review Labs Reviewed - No data to display Imaging Review No results found.  EKG Interpretation    Date/Time:  Saturday September 07 2013 11:51:49 EST Ventricular Rate:  91 PR Interval:  154 QRS Duration: 136 QT Interval:  372 QTC Calculation: 457 R Axis:   52 Text Interpretation:  Normal sinus rhythm Right bundle branch block Abnormal ECG When compared with ECG of 20-Nov-2012 14:04, No significant change was found Confirmed by DELOS  MD, Collyns Mcquigg ([id number]) on 09/07/2013 1:35:28 PM            MDM  No diagnosis found. Patient is a 67 year old male who presents with complaints of fall which occurred last night. He has a history of dizziness and believes that's what caused him to fall. He is  complaining of pain in the left upper back, right ankle. Last night he was having right hip pain but his hip feels better now. Does have a history of her placement and has been in the liver this morning.  Vital signs are unremarkable and physical examination reveals tenderness to palpation over the lateral right ankle and left posterior chest wall. X-rays of these areas are unremarkable. There is no evidence  for ankle fracture or rib fracture and more importantly no evidence for pneumothorax. The hardware in the right hip is in place and there is no evidence for slippage or dislocation.  He will be discharged to home with instructions to return if his symptoms worsen or change.   I personally performed the services described in this documentation, which was scribed in my presence. The recorded information has been reviewed and is accurate.        Geoffery Lyons, MD 09/07/13 1356

## 2013-09-07 NOTE — ED Notes (Signed)
Patient transported to X-ray by regina. [name] [name]

## 2014-02-16 ENCOUNTER — Emergency Department (HOSPITAL_COMMUNITY): Payer: Medicare Other

## 2014-02-16 ENCOUNTER — Observation Stay (HOSPITAL_COMMUNITY)
Admission: EM | Admit: 2014-02-16 | Discharge: 2014-02-17 | Disposition: A | Discharge status: Home / Self Care | Payer: Medicare Other | Attending: Internal Medicine | Admitting: Internal Medicine

## 2014-02-16 ENCOUNTER — Encounter (HOSPITAL_COMMUNITY): Payer: Self-pay | Admitting: Emergency Medicine

## 2014-02-16 DIAGNOSIS — F1010 Alcohol abuse, uncomplicated: Secondary | ICD-10-CM | POA: Insufficient documentation

## 2014-02-16 DIAGNOSIS — J019 Acute sinusitis, unspecified: Secondary | ICD-10-CM | POA: Diagnosis present

## 2014-02-16 DIAGNOSIS — M6281 Muscle weakness (generalized): Secondary | ICD-10-CM | POA: Insufficient documentation

## 2014-02-16 DIAGNOSIS — F3289 Other specified depressive episodes: Secondary | ICD-10-CM | POA: Insufficient documentation

## 2014-02-16 DIAGNOSIS — R262 Difficulty in walking, not elsewhere classified: Secondary | ICD-10-CM | POA: Insufficient documentation

## 2014-02-16 DIAGNOSIS — D649 Anemia, unspecified: Secondary | ICD-10-CM | POA: Insufficient documentation

## 2014-02-16 DIAGNOSIS — E538 Deficiency of other specified B group vitamins: Secondary | ICD-10-CM

## 2014-02-16 DIAGNOSIS — Z96649 Presence of unspecified artificial hip joint: Secondary | ICD-10-CM | POA: Insufficient documentation

## 2014-02-16 DIAGNOSIS — N179 Acute kidney failure, unspecified: Secondary | ICD-10-CM | POA: Insufficient documentation

## 2014-02-16 DIAGNOSIS — R51 Headache: Secondary | ICD-10-CM | POA: Insufficient documentation

## 2014-02-16 DIAGNOSIS — M87051 Idiopathic aseptic necrosis of right femur: Secondary | ICD-10-CM | POA: Diagnosis present

## 2014-02-16 DIAGNOSIS — Z85828 Personal history of other malignant neoplasm of skin: Secondary | ICD-10-CM | POA: Insufficient documentation

## 2014-02-16 DIAGNOSIS — G459 Transient cerebral ischemic attack, unspecified: Secondary | ICD-10-CM | POA: Insufficient documentation

## 2014-02-16 DIAGNOSIS — F329 Major depressive disorder, single episode, unspecified: Secondary | ICD-10-CM | POA: Insufficient documentation

## 2014-02-16 DIAGNOSIS — Z79899 Other long term (current) drug therapy: Secondary | ICD-10-CM | POA: Insufficient documentation

## 2014-02-16 DIAGNOSIS — G8929 Other chronic pain: Secondary | ICD-10-CM | POA: Insufficient documentation

## 2014-02-16 DIAGNOSIS — G9349 Other encephalopathy: Principal | ICD-10-CM | POA: Insufficient documentation

## 2014-02-16 DIAGNOSIS — E860 Dehydration: Secondary | ICD-10-CM | POA: Insufficient documentation

## 2014-02-16 DIAGNOSIS — I10 Essential (primary) hypertension: Secondary | ICD-10-CM | POA: Insufficient documentation

## 2014-02-16 DIAGNOSIS — G9340 Encephalopathy, unspecified: Secondary | ICD-10-CM | POA: Diagnosis present

## 2014-02-16 DIAGNOSIS — M549 Dorsalgia, unspecified: Secondary | ICD-10-CM | POA: Insufficient documentation

## 2014-02-16 LAB — RAPID URINE DRUG SCREEN, HOSP PERFORMED
AMPHETAMINES: NOT DETECTED
BENZODIAZEPINES: POSITIVE — AB
Barbiturates: NOT DETECTED
Cocaine: NOT DETECTED
Opiates: NOT DETECTED
TETRAHYDROCANNABINOL: NOT DETECTED

## 2014-02-16 LAB — BASIC METABOLIC PANEL
BUN: 37 mg/dL — ABNORMAL HIGH (ref 6–23)
CO2: 26 mEq/L (ref 19–32)
Calcium: 8.7 mg/dL (ref 8.4–10.5)
Chloride: 102 mEq/L (ref 96–112)
Creatinine, Ser: 1.47 mg/dL — ABNORMAL HIGH (ref 0.50–1.35)
GFR calc Af Amer: 55 mL/min — ABNORMAL LOW (ref >90)
GFR calc non Af Amer: 47 mL/min — ABNORMAL LOW (ref >90)
Glucose, Bld: 113 mg/dL — ABNORMAL HIGH (ref 70–99)
Potassium: 4.2 mEq/L (ref 3.7–5.3)
Sodium: 138 mEq/L (ref 137–147)

## 2014-02-16 LAB — CBC WITH DIFFERENTIAL/PLATELET
Basophils Absolute: 0 10*3/uL (ref 0.0–0.1)
Basophils Relative: 0 % (ref 0–1)
Eosinophils Absolute: 0.1 10*3/uL (ref 0.0–0.7)
Eosinophils Relative: 2 % (ref 0–5)
HCT: 39.6 % (ref 39.0–52.0)
Hemoglobin: 12.7 g/dL — ABNORMAL LOW (ref 13.0–17.0)
Lymphocytes Relative: 21 % (ref 12–46)
Lymphs Abs: 1.3 10*3/uL (ref 0.7–4.0)
MCH: 32.1 pg (ref 26.0–34.0)
MCHC: 32.1 g/dL (ref 30.0–36.0)
MCV: 100 fL (ref 78.0–100.0)
Monocytes Absolute: 0.6 10*3/uL (ref 0.1–1.0)
Monocytes Relative: 9 % (ref 3–12)
Neutro Abs: 4 10*3/uL (ref 1.7–7.7)
Neutrophils Relative %: 68 % (ref 43–77)
Platelets: 159 10*3/uL (ref 150–400)
RBC: 3.96 MIL/uL — ABNORMAL LOW (ref 4.22–5.81)
RDW: 14.5 % (ref 11.5–15.5)
WBC: 5.9 10*3/uL (ref 4.0–10.5)

## 2014-02-16 LAB — VITAMIN B12: Vitamin B-12: 327 pg/mL (ref 211–911)

## 2014-02-16 LAB — HIV ANTIBODY (ROUTINE TESTING W REFLEX): HIV 1&2 Ab, 4th Generation: NONREACTIVE

## 2014-02-16 LAB — RPR

## 2014-02-16 LAB — URINALYSIS, ROUTINE W REFLEX MICROSCOPIC
Bilirubin Urine: NEGATIVE
Glucose, UA: NEGATIVE mg/dL
Hgb urine dipstick: NEGATIVE
Ketones, ur: NEGATIVE mg/dL
Leukocytes, UA: NEGATIVE
Nitrite: NEGATIVE
Protein, ur: NEGATIVE mg/dL
Specific Gravity, Urine: >1.03 — ABNORMAL HIGH (ref 1.005–1.030)
Urobilinogen, UA: 0.2 mg/dL (ref 0.0–1.0)
pH: 5.5 (ref 5.0–8.0)

## 2014-02-16 LAB — CK: Total CK: 261 U/L — ABNORMAL HIGH (ref 7–232)

## 2014-02-16 LAB — TSH: TSH: 1.11 u[IU]/mL (ref 0.350–4.500)

## 2014-02-16 LAB — LACTIC ACID, PLASMA: Lactic Acid, Venous: 0.6 mmol/L (ref 0.5–2.2)

## 2014-02-16 MED ORDER — ADULT MULTIVITAMIN W/MINERALS CH
1.0000 | ORAL_TABLET | Freq: Every day | ORAL | Status: DC
  Administered 2014-02-16 – 2014-02-17 (×2): 1 via ORAL
  Filled 2014-02-16 (×2): qty 1

## 2014-02-16 MED ORDER — FOLIC ACID 1 MG PO TABS
1.0000 mg | ORAL_TABLET | Freq: Every day | ORAL | Status: DC
  Administered 2014-02-16 – 2014-02-17 (×2): 1 mg via ORAL
  Filled 2014-02-16 (×2): qty 1

## 2014-02-16 MED ORDER — CITALOPRAM HYDROBROMIDE 20 MG PO TABS
20.0000 mg | ORAL_TABLET | Freq: Every day | ORAL | Status: DC
  Administered 2014-02-16 – 2014-02-17 (×2): 20 mg via ORAL
  Filled 2014-02-16 (×2): qty 1

## 2014-02-16 MED ORDER — AMOXICILLIN-POT CLAVULANATE 875-125 MG PO TABS
1.0000 | ORAL_TABLET | Freq: Two times a day (BID) | ORAL | Status: DC
  Administered 2014-02-16 – 2014-02-17 (×2): 1 via ORAL
  Filled 2014-02-16 (×2): qty 1

## 2014-02-16 MED ORDER — VITAMIN B-1 100 MG PO TABS
100.0000 mg | ORAL_TABLET | Freq: Every day | ORAL | Status: DC
  Administered 2014-02-16 – 2014-02-17 (×2): 100 mg via ORAL
  Filled 2014-02-16 (×2): qty 1

## 2014-02-16 MED ORDER — ONDANSETRON HCL 4 MG PO TABS: 4.0000 mg | ORAL_TABLET | Freq: Four times a day (QID) | ORAL | Status: DC | PRN

## 2014-02-16 MED ORDER — LORAZEPAM 2 MG/ML IJ SOLN: 1.0000 mg | Freq: Four times a day (QID) | INTRAMUSCULAR | Status: DC | PRN

## 2014-02-16 MED ORDER — ACETAMINOPHEN 325 MG PO TABS
650.0000 mg | ORAL_TABLET | Freq: Four times a day (QID) | ORAL | Status: DC | PRN
  Administered 2014-02-17: 650 mg via ORAL
  Filled 2014-02-16: qty 2

## 2014-02-16 MED ORDER — CYCLOBENZAPRINE HCL 10 MG PO TABS: 10.0000 mg | ORAL_TABLET | Freq: Three times a day (TID) | ORAL | Status: DC | PRN

## 2014-02-16 MED ORDER — THIAMINE HCL 100 MG/ML IJ SOLN: 100.0000 mg | Freq: Every day | INTRAMUSCULAR | Status: DC

## 2014-02-16 MED ORDER — ACETAMINOPHEN 500 MG PO TABS
1000.0000 mg | ORAL_TABLET | Freq: Once | ORAL | Status: AC
  Administered 2014-02-16: 1000 mg via ORAL
  Filled 2014-02-16: qty 2

## 2014-02-16 MED ORDER — SODIUM CHLORIDE 0.9 % IV SOLN
INTRAVENOUS | Status: DC
  Administered 2014-02-16: 20:00:00 via INTRAVENOUS

## 2014-02-16 MED ORDER — ONDANSETRON HCL 4 MG/2ML IJ SOLN: 4.0000 mg | Freq: Four times a day (QID) | INTRAMUSCULAR | Status: DC | PRN

## 2014-02-16 MED ORDER — LORAZEPAM 1 MG PO TABS: 1.0000 mg | ORAL_TABLET | Freq: Four times a day (QID) | ORAL | Status: DC | PRN

## 2014-02-16 MED ORDER — SODIUM CHLORIDE 0.9 % IJ SOLN
3.0000 mL | Freq: Two times a day (BID) | INTRAMUSCULAR | Status: DC
  Administered 2014-02-17: 3 mL via INTRAVENOUS

## 2014-02-16 MED ORDER — HEPARIN SODIUM (PORCINE) 5000 UNIT/ML IJ SOLN
5000.0000 [IU] | Freq: Three times a day (TID) | INTRAMUSCULAR | Status: DC
  Administered 2014-02-16 – 2014-02-17 (×2): 5000 [IU] via SUBCUTANEOUS
  Filled 2014-02-16 (×2): qty 1

## 2014-02-16 MED ORDER — ASPIRIN EC 325 MG PO TBEC
325.0000 mg | DELAYED_RELEASE_TABLET | Freq: Every day | ORAL | Status: DC
  Administered 2014-02-16 – 2014-02-17 (×2): 325 mg via ORAL
  Filled 2014-02-16 (×2): qty 1

## 2014-02-16 MED ORDER — ALBUTEROL SULFATE (2.5 MG/3ML) 0.083% IN NEBU
2.5000 mg | INHALATION_SOLUTION | Freq: Every day | RESPIRATORY_TRACT | Status: DC
  Administered 2014-02-16 – 2014-02-17 (×2): 2.5 mg via RESPIRATORY_TRACT
  Filled 2014-02-16 (×2): qty 3

## 2014-02-16 MED ORDER — VITAMIN D (ERGOCALCIFEROL) 1.25 MG (50000 UNIT) PO CAPS
50000.0000 [IU] | ORAL_CAPSULE | ORAL | Status: DC
  Administered 2014-02-17: 50000 [IU] via ORAL
  Filled 2014-02-16 (×2): qty 1

## 2014-02-16 MED ORDER — ACETAMINOPHEN 650 MG RE SUPP: 650.0000 mg | Freq: Four times a day (QID) | RECTAL | Status: DC | PRN

## 2014-02-16 MED ORDER — SODIUM CHLORIDE 0.9 % IV BOLUS (SEPSIS)
1000.0000 mL | Freq: Once | INTRAVENOUS | Status: AC
  Administered 2014-02-16: 1000 mL via INTRAVENOUS

## 2014-02-16 NOTE — ED Provider Notes (Signed)
CSN: [id number]     Arrival date & time 02/16/14  1223 History  This chart was scribed for Virgel Manifold, MD by Zettie Pho, ED Scribe. This patient was seen in room APA18/APA18 and the patient's care was started at 1:04 PM.    Chief Complaint  Patient presents with  . Eye Problem  . Dysphagia   The history is provided by the patient and the spouse. No language interpreter was used.   HPI Comments: [name] [name] is a 68 y.o. male who presents to the Emergency Department complaining of "feeling crazy" onset 2 days ago after he reports working outside in the heat. He reports some confusion with associated lightheadedness and generalized weakness. He reports some difficulty walking and states that he has had to use a walker for the past few days, which he normally does not need to use. Patient's speech is tangential and disoriented during exam. His wife also states that the patient's speech is unusual for him. Patient denies any potential injury or trauma to the head. He reports some pain to the lower back for the past 2 days, but denies any currently. Patient states that he may have taken "too many pain pills," but that his symptoms presented before he took the medication. He reports some urinary hesitancy with a subjective fever (patient is afebrile at 98.8 in the ED). He denies dysuria, hematuria. His wife reports that the patient was started on Celexa a few days ago. Patient has a history of HTN.   Past Medical History  Diagnosis Date  . Abscess of right hip 04/11/2012  . Nephrolithiasis   . Medial epicondylitis   . Gastric ulcer   . Hypertension   . Skin cancer     20 removed  . 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 I&d extremity  04/14/2012    Procedure: IRRIGATION AND DEBRIDEMENT EXTREMITY;  Surgeon: Johnny Bridge, MD;  Location: [facility];  Service: Orthopedics;  Laterality: Right;  I & D Hip  . Total hip arthroplasty  Right 11/26/2012    Procedure: TOTAL HIP ARTHROPLASTY;  Surgeon: Johnny Bridge, MD;  Location: [place];  Service: Orthopedics;  Laterality: Right;   Family History  Problem Relation Age of Onset  . Heart disease Father    History  Substance Use Topics  . Smoking status: Never Smoker   . Smokeless tobacco: Not on file  . Alcohol Use: No     Comment: stopped 5 year ago    Review of Systems  Constitutional: Positive for fever (subjective, resolved).  Genitourinary: Positive for difficulty urinating. Negative for dysuria and hematuria.  Musculoskeletal: Positive for back pain (resolved) and gait problem.  Neurological: Positive for speech difficulty, weakness and light-headedness.  Psychiatric/Behavioral: Positive for confusion.  All other systems reviewed and are negative.   Allergies  Review of patient's allergies indicates no known allergies.  Home Medications   Prior to Admission medications   Medication Sig Start Date End Date Taking? Authorizing Provider  ALPRAZolam Duanne Moron) 1 MG tablet Take 1 mg by mouth 2 (two) times daily as needed for anxiety or sleep. anxiety    Historical Provider, MD  Aspirin-Acetaminophen-Caffeine (GOODY HEADACHE PO) Take 2 packets by mouth daily as needed (headache).    Historical Provider, MD  benazepril (LOTENSIN) 20 MG tablet Take 10 mg by mouth daily.     Historical Provider, MD  cyclobenzaprine (FLEXERIL) 10 MG tablet Take 10 mg  by mouth 3 (three) times daily as needed for muscle spasms.    Historical Provider, MD  gabapentin (NEURONTIN) 300 MG capsule Take 300 mg by mouth at bedtime.    Historical Provider, MD  oxyCODONE (ROXICODONE) 15 MG immediate release tablet Take 15 mg by mouth every 4 (four) hours as needed. For pain    Historical Provider, MD   Triage Vitals: BP 126/72  Pulse 104  Temp(Src) 98.8 F (37.1 C) (Oral)  Resp 20  Ht 5\' 9"  (1.753 m)  Wt 195 lb (88.451 kg)  BMI 28.78 kg/m2  SpO2 93%  Physical Exam  Nursing note and  vitals reviewed. Constitutional: He appears well-developed and well-nourished.  HENT:  Head: Normocephalic and atraumatic.  Eyes: Conjunctivae and EOM are normal. Pupils are equal, round, and reactive to light.  Neck: Normal range of motion.  Cardiovascular: Normal rate, regular rhythm, normal heart sounds and intact distal pulses.   Pulmonary/Chest: Effort normal and breath sounds normal. No respiratory distress.  Abdominal: Soft. He exhibits no distension. There is no tenderness.  No CVA tenderness.   Musculoskeletal: Normal range of motion.  Tenderness to palpation to the mid-lower thoracic region on the right side.   Neurological: He is alert. No cranial nerve deficit.  5/5 and equal grip strength. Normal strength against resistance of bilateral lower extremities. No pronator drift. Normal finger-to-nose testing bilaterally with no past pointing. Speech is rambling and slightly slurred.   Skin: Skin is warm and dry.  Psychiatric: He has a normal mood and affect. Judgment normal.    ED Course  Procedures (including critical care time)  DIAGNOSTIC STUDIES: Oxygen Saturation is 93% on room air, adequate by my interpretation.    COORDINATION OF CARE: 1:21 PM- Will order a head CT, chest x-ray, UA, CBC, BMP, lactic acid, and EKG. Discussed treatment plan with patient at bedside and patient verbalized agreement.   3:28 PM- Discussed that lab results indicate an infection. Discussed that the patient will need to be admitted to the hospital for further evaluation and care. Discussed treatment plan with patient at bedside and patient verbalized agreement.   Labs Review Labs Reviewed  URINALYSIS, ROUTINE W REFLEX MICROSCOPIC - Abnormal; Notable for the following:    Specific Gravity, Urine >1.030 (*)    All other components within normal limits  CBC WITH DIFFERENTIAL - Abnormal; Notable for the following:    RBC 3.96 (*)    Hemoglobin 12.7 (*)    All other components within normal  limits  BASIC METABOLIC PANEL - Abnormal; Notable for the following:    Glucose, Bld 113 (*)    BUN 37 (*)    Creatinine, Ser 1.47 (*)    GFR calc non Af Amer 47 (*)    GFR calc Af Amer 55 (*)    All other components within normal limits  LACTIC ACID, PLASMA    Imaging Review Dg Chest 2 View  02/16/2014   CLINICAL DATA:  Shortness of breath.  Anemia.  Sepsis workup.  EXAM: CHEST  2 VIEW  COMPARISON:  DG CHEST 2V dated 01/29/2014; DG RIBS UNILATERAL W/CHEST*L* dated 09/07/2013; DG CHEST 2 VIEW dated 11/20/2012.  FINDINGS: Cardiac silhouette upper normal in size, unchanged. Thoracic aorta mildly atherosclerotic, unchanged. Hilar and mediastinal contours otherwise unremarkable. Stable chronic elevation of the right hemidiaphragm with interposition of the hepatic flexure of the colon between the liver in the right hemidiaphragm. Lungs clear. Bronchovascular markings normal. Pulmonary vascularity normal. No visible pleural effusions. No pneumothorax. Visualized bony  thorax intact apart from degenerative changes in the acromioclavicular joints.  IMPRESSION: No acute cardiopulmonary disease. Stable chronic elevation of the right hemidiaphragm.   Electronically Signed   By: Evangeline Dakin M.D.   On: 02/16/2014 14:17   Ct Head Wo Contrast  02/16/2014   CLINICAL DATA:  Altered mental status  EXAM: CT HEAD WITHOUT CONTRAST  TECHNIQUE: Contiguous axial images were obtained from the base of the skull through the vertex without intravenous contrast.  COMPARISON:  None.  FINDINGS: Mild to moderate diffuse cortical atrophy. No abnormal attenuation identified to suggest hemorrhage, extra-axial fluid, or vascular territory infarct. No hydrocephalus or mass. The calvarium is intact. There are air-fluid levels in the bilateral maxillary sinuses. The remainder of the paranasal sinuses appear clear.  IMPRESSION: Findings consistent with acute bilateral maxillary sinusitis. Otherwise no acute findings.   Electronically  Signed   By: Skipper Cliche M.D.   On: 02/16/2014 14:14     EKG Interpretation None      MDM   Final diagnoses:  Acute encephalopathy  Acute kidney injury  Acute sinusitis    67yM with AMS. Unclear etiology. Consider infectious, although not clear source if so. Pt on benzos but has been for years. Additional dose of pain medication this morning but symptoms preceding this. No reported trauma. Aside from mental status, neuro exam is nonfocal. CT head w/o acute abnormality. Hypoxemia noted initially on arrival. Pt subsequently ambulated in ED w/o desat. CXR clear.  Pt could not ambulate w/o assistance though and normally can do so. Will admit for further eval.   I personally preformed the services scribed in my presence. The recorded information has been reviewed is accurate. Virgel Manifold, MD.     Virgel Manifold, MD 02/16/14 [phone]

## 2014-02-16 NOTE — ED Notes (Signed)
Pt eating dinner provided by family.  Pt does report history of hiatal hernia that causes difficulty swallowing at times.  Encouraged smaller bites and eating more slowly.  No other complaints verbalized at present.

## 2014-02-16 NOTE — ED Notes (Signed)
Pt able to void prior to fluid bolus and in and cath could be performed. EDP aware and reported to d/c in and out cath order and to hold off on intravenous fluids at this time.

## 2014-02-16 NOTE — H&P (Addendum)
[facility] History and Physical  [name] [name]. Kedzierski SLH:[id number] DOB: 1946/09/14 DOA: 02/16/2014  Referring physician: Dr. Wilson Singer PCP: Octavio Graves, DO   Chief Complaint:  Altered mental status since one day  HPI:  68 year old male with history of hypertension, alcohol abuse,  chronic back pain, was brought to the hospital by the family for acute onset of confusion since this morning. As per his daughter is at bedside patient's wife noticed him to be quite confused this morning and was shaking for some time. His speech was slurred and he appeared disoriented. He said he reports that he was working outside in the heat to his back and feels that he has been feeling generalized weakness and some dizziness since then. This morning he felt his thoughts were clouded . Patient was not sure about his medications. His wife also noticed that he was unsteady when he walked. Patient has a cane which she does not use at home. He reports off and on headache for past 2 days. He also reports difficulty urinating today. He denies any fever, chills, nausea, vomiting, blurred vision, chest pain, palpitations, shortness of breath, abdominal pain, diarrhea. Denies any change in his weight or appetite. He denies any recent travel or sick contact. Patient was started on Celexa a few days back. Patient reports drinking alcohol about once or twice a week however her daughter thinks she may be drinking more than that. He reports that his last drink was 2 days back but the daughter thinks he was probably drinking 2 days back when she met him.  Course in the ED Patient had low-grade temperature 100.55F. Remaining vitals were stable. Blood work done showed mild anemia with hemoglobin of 12.7. Chemistry showed BUN of 37 and creatinine 1.47. Marland Kitchen Chest x-ray was unremarkable. UA was unremarkable. CT head showed bilateral maxillary sinusitis. GU is unremarkable. hospitalist  called for admission to telemetry.  Review of  Systems:  Constitutional: Denies fever, chills, diaphoresis, appetite change and fatigue.  HEENT: Nasal congestion and frontal headache, Denies photophobia, eye pain, redness, hearing loss, ear pain, congestion, sore throat, rhinorrhea, sneezing, mouth sores, trouble swallowing, neck pain, neck stiffness and tinnitus.   Respiratory: Denies SOB, DOE, cough, chest tightness,  and wheezing.   Cardiovascular: Denies chest pain, palpitations and leg swelling.  Gastrointestinal: Denies nausea, vomiting, abdominal pain, diarrhea, constipation, blood in stool and abdominal distention.  Genitourinary: Reports difficulty urinating Denies dysuria, urgency, frequency, hematuria, flank pain Endocrine: Denies: hot or cold intolerance,  polyuria, polydipsia. Musculoskeletal: Chronic back pain and right hip pain. Denies myalgias,  joint swelling,  and gait problem.  Skin: Denies pallor, rash and wound.  Neurological: Denies dizziness, seizures, syncope, weakness, light-headedness, numbness and headaches+.  Psychiatric/Behavioral: Confusion, agitation, Denies, mood changes,  nervousness, sleep disturbance    Past Medical History  Diagnosis Date  . Abscess of right hip 04/11/2012  . Nephrolithiasis   . Medial epicondylitis   . Gastric ulcer   . Hypertension   . Skin cancer     20 removed  . 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 I&d extremity  04/14/2012    Procedure: IRRIGATION AND DEBRIDEMENT EXTREMITY;  Surgeon: Johnny Bridge, MD;  Location: [place];  Service: Orthopedics;  Laterality: Right;  I & D Hip  . Total hip arthroplasty Right 11/26/2012    Procedure: TOTAL HIP ARTHROPLASTY;  Surgeon: Johnny Bridge, MD;  Location: [place];  Service: Orthopedics;  Laterality:  Right;   Social History:  reports that he has never smoked. He does not have any smokeless tobacco history on file. He reports that he does not drink alcohol or use illicit  drugs.  No Known Allergies  Family History  Problem Relation Age of Onset  . Heart disease Father     Prior to Admission medications   Medication Sig Start Date End Date Taking? Authorizing Provider  ALPRAZolam Duanne Moron) 1 MG tablet Take 3 mg by mouth at bedtime.    Yes Historical Provider, MD  benazepril (LOTENSIN) 20 MG tablet Take 20 mg by mouth daily.    Yes Historical Provider, MD  citalopram (CELEXA) 20 MG tablet Take 20 mg by mouth daily. 02/10/14  Yes Historical Provider, MD  oxycodone (ROXICODONE) 30 MG immediate release tablet Take 30 mg by mouth every 4 (four) hours as needed for pain.   Yes Historical Provider, MD  PROAIR HFA 108 (90 BASE) MCG/ACT inhaler Inhale 2 puffs into the lungs daily. 01/28/14  Yes Historical Provider, MD  Vitamin D, Ergocalciferol, (DRISDOL) 50000 UNITS CAPS capsule Take 50,000 Units by mouth 3 (three) times a week.   Yes Historical Provider, MD  cyclobenzaprine (FLEXERIL) 10 MG tablet Take 10 mg by mouth 3 (three) times daily as needed for muscle spasms.    Historical Provider, MD     Physical Exam:  Filed Vitals:   02/16/14 1438 02/16/14 1500 02/16/14 1530 02/16/14 1536  BP: 105/77 110/71 120/76   Pulse: 95 95 92   Temp:    98.5 F (36.9 C)  TempSrc:    Oral  Resp: 13 12 13    Height:      Weight:      SpO2: 96% 97% 96%     Constitutional: Vital signs reviewed.  Patient is an elderly male in no acute distress. HEENT: no pallor, no icterus, moist oral mucosa, no cervical lymphadenopathy, normal dentition Cardiovascular: RRR, S1 normal, S2 normal, no MRG Chest: CTAB, no wheezes, rales, or rhonchi Abdominal: Soft. Non-tender, non-distended, bowel sounds are normal, no masses, organomegaly, or guarding present.  GU: no CVA tenderness Ext: warm, no edema, areas of bruise over her left shin  Neurological: A&O x3, cranial nerves 2-12 intact normal motor tone, power and reflex, normal sensation bilaterally, cerebellar function shows mild left sided  discoordination, gait not assessed, no meningeal signs  Labs on Admission:  Basic Metabolic Panel:  Recent Labs Lab 02/16/14 1327  NA 138  K 4.2  CL 102  CO2 26  GLUCOSE 113*  BUN 37*  CREATININE 1.47*  CALCIUM 8.7   Liver Function Tests: No results found for this basename: AST, ALT, ALKPHOS, BILITOT, PROT, ALBUMIN,  in the last 168 hours No results found for this basename: LIPASE, AMYLASE,  in the last 168 hours No results found for this basename: AMMONIA,  in the last 168 hours CBC:  Recent Labs Lab 02/16/14 1327  WBC 5.9  NEUTROABS 4.0  HGB 12.7*  HCT 39.6  MCV 100.0  PLT 159   Cardiac Enzymes: No results found for this basename: CKTOTAL, CKMB, CKMBINDEX, TROPONINI,  in the last 168 hours BNP: No components found with this basename: POCBNP,  CBG: No results found for this basename: GLUCAP,  in the last 168 hours  Radiological Exams on Admission: Dg Chest 2 View  02/16/2014   CLINICAL DATA:  Shortness of breath.  Anemia.  Sepsis workup.  EXAM: CHEST  2 VIEW  COMPARISON:  DG CHEST 2V dated 01/29/2014; DG RIBS  UNILATERAL W/CHEST*L* dated 09/07/2013; DG CHEST 2 VIEW dated 11/20/2012.  FINDINGS: Cardiac silhouette upper normal in size, unchanged. Thoracic aorta mildly atherosclerotic, unchanged. Hilar and mediastinal contours otherwise unremarkable. Stable chronic elevation of the right hemidiaphragm with interposition of the hepatic flexure of the colon between the liver in the right hemidiaphragm. Lungs clear. Bronchovascular markings normal. Pulmonary vascularity normal. No visible pleural effusions. No pneumothorax. Visualized bony thorax intact apart from degenerative changes in the acromioclavicular joints.  IMPRESSION: No acute cardiopulmonary disease. Stable chronic elevation of the right hemidiaphragm.   Electronically Signed   By: Evangeline Dakin M.D.   On: 02/16/2014 14:17   Ct Head Wo Contrast  02/16/2014   CLINICAL DATA:  Altered mental status  EXAM: CT HEAD  WITHOUT CONTRAST  TECHNIQUE: Contiguous axial images were obtained from the base of the skull through the vertex without intravenous contrast.  COMPARISON:  None.  FINDINGS: Mild to moderate diffuse cortical atrophy. No abnormal attenuation identified to suggest hemorrhage, extra-axial fluid, or vascular territory infarct. No hydrocephalus or mass. The calvarium is intact. There are air-fluid levels in the bilateral maxillary sinuses. The remainder of the paranasal sinuses appear clear.  IMPRESSION: Findings consistent with acute bilateral maxillary sinusitis. Otherwise no acute findings.   Electronically Signed   By: Skipper Cliche M.D.   On: 02/16/2014 14:14    EKG: Normal sinus rhythm, no ST-T changes  Assessment/Plan  principle  Problem Acute encephalopathy Differential includes toxic , metabolic versus TIA/CVA. No clear sign of infection although he has maxillary sinusitis on head CT. Admit to telemetry under observation. Head CT negative for acute findings. Will obtain MRI/MRA brain to rule out acute stroke. Check EEG and urine for drug screen. Check B12 level, TSH, RPR and HIV. -further cardiac w/up based on MRI results. -check LDL and A1C -Will place on full dose aspirin. Clear swallow eval in the ED pediatric PT/OT eval. -Discontinue benzodiazepine and pain medications.  Active Problems:  Alcohol abuse Patient reports drinking less frequently and last drink 2 days back. Unclear if he drinks more frequently at home. Placed on CIWA. Added thiamine, folate and multivitamin. Check blood alcohol level. Counseled on minimizing alcohol intake safety when he is taking benzodiazepines and pain medications.   Hypertension Hold Lotensin given  mild acute kidney injury    Acute sinusitis   -Will treat with a course of Augmentin    Acute kidney injury Monitor with IV hydration. Hold Lotensin.    Avascular necrosis of right femoral head Status post right hip surgery one year back and is  on chronic Percocet.        Diet:cardiac  DVT prophylaxis: sq heparin   Code Status: full code Family Communication: discussed with daughters at bedside Disposition Plan: home once w/up completed  [name] [name] [facility] Pager [phone]  Total time spent on admission 50 minutes  If 7PM-7AM, please contact night-coverage [email or web address] Password [email or web address] 02/16/2014, 4:18 PM

## 2014-02-16 NOTE — ED Notes (Signed)
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

## 2014-02-16 NOTE — ED Notes (Addendum)
Ambulated pt with pulse oximetry. Pt taken off of oxygen for ambulation. Pt o2 saturation during ambulation ranged from 91-96%. Pt heart rate ranged from 95-101. Pt reports intermittent dizziness during ambulation and chest pain near the end of ambulation. Pt denies any chest pain or dizziness now that pt is back in bed. Pt o2 saturation after ambulation 89%. Pt placed back on 2liters [place]. EDP aware.

## 2014-02-16 NOTE — ED Notes (Addendum)
Pt o2 sats on room air 85-88%. Pt placed on 2 liters nasal cannula. Pt o2 saturation 98%. Pt tolerating well. nad noted. Pt denies being on any oxygen at home.

## 2014-02-16 NOTE — ED Notes (Signed)
Lab contacted concerning if drug abuse screen could be added on to urine that was collected earlier this am. Lab reported it could and that paper order not needed for test to added on and completed.

## 2014-02-17 ENCOUNTER — Observation Stay (HOSPITAL_COMMUNITY): Payer: Medicare Other

## 2014-02-17 ENCOUNTER — Observation Stay (HOSPITAL_COMMUNITY)
Admit: 2014-02-17 | Discharge: 2014-02-17 | Disposition: A | Discharge status: Home / Self Care | Payer: Medicare Other | Attending: Neurology | Admitting: Neurology

## 2014-02-17 DIAGNOSIS — F1010 Alcohol abuse, uncomplicated: Secondary | ICD-10-CM

## 2014-02-17 DIAGNOSIS — I10 Essential (primary) hypertension: Secondary | ICD-10-CM

## 2014-02-17 DIAGNOSIS — E538 Deficiency of other specified B group vitamins: Secondary | ICD-10-CM

## 2014-02-17 LAB — LIPID PANEL
Cholesterol: 204 mg/dL — ABNORMAL HIGH (ref 0–200)
HDL: 84 mg/dL (ref >39)
LDL CALC: 103 mg/dL — AB (ref 0–99)
Total CHOL/HDL Ratio: 2.4 RATIO
Triglycerides: 85 mg/dL (ref <150)
VLDL: 17 mg/dL (ref 0–40)

## 2014-02-17 LAB — BASIC METABOLIC PANEL
BUN: 25 mg/dL — ABNORMAL HIGH (ref 6–23)
CALCIUM: 8.8 mg/dL (ref 8.4–10.5)
CO2: 29 mEq/L (ref 19–32)
Chloride: 105 mEq/L (ref 96–112)
Creatinine, Ser: 0.99 mg/dL (ref 0.50–1.35)
GFR, EST NON AFRICAN AMERICAN: 82 mL/min — AB (ref >90)
Glucose, Bld: 97 mg/dL (ref 70–99)
Potassium: 5 mEq/L (ref 3.7–5.3)
SODIUM: 141 meq/L (ref 137–147)

## 2014-02-17 LAB — HEMOGLOBIN A1C
Hgb A1c MFr Bld: 6.1 % — ABNORMAL HIGH (ref <5.7)
Mean Plasma Glucose: 128 mg/dL — ABNORMAL HIGH (ref <117)

## 2014-02-17 MED ORDER — AMOXICILLIN-POT CLAVULANATE 875-125 MG PO TABS
1.0000 | ORAL_TABLET | Freq: Two times a day (BID) | ORAL | Status: AC
Start: 2014-02-17 — End: 2014-02-26

## 2014-02-17 MED ORDER — LORATADINE 10 MG PO TABS
10.0000 mg | ORAL_TABLET | Freq: Every day | ORAL | Status: DC
  Administered 2014-02-17: 10 mg via ORAL
  Filled 2014-02-17: qty 1

## 2014-02-17 MED ORDER — LORATADINE 10 MG PO TABS: 10.0000 mg | ORAL_TABLET | Freq: Every day | ORAL | Status: DC

## 2014-02-17 MED ORDER — ADULT MULTIVITAMIN W/MINERALS CH: 1.0000 | ORAL_TABLET | Freq: Every day | ORAL | Status: DC

## 2014-02-17 MED ORDER — OXYCODONE HCL 30 MG PO TABS: 30.0000 mg | ORAL_TABLET | Freq: Four times a day (QID) | ORAL | Status: DC | PRN

## 2014-02-17 MED ORDER — CYANOCOBALAMIN 500 MCG PO TABS: 1000.0000 ug | ORAL_TABLET | Freq: Every day | ORAL | Status: DC

## 2014-02-17 MED ORDER — FLUTICASONE PROPIONATE 50 MCG/ACT NA SUSP: 2.0000 | Freq: Every day | NASAL | Status: DC

## 2014-02-17 MED ORDER — TRAMADOL HCL 50 MG PO TABS
50.0000 mg | ORAL_TABLET | Freq: Once | ORAL | Status: AC
  Administered 2014-02-17: 50 mg via ORAL
  Filled 2014-02-17: qty 1

## 2014-02-17 NOTE — Progress Notes (Signed)
STAT EEG completed; results pending. 

## 2014-02-17 NOTE — Discharge Summary (Signed)
Physician Discharge Summary  Nelda Severe. Thornton IOE:[id number] DOB: [birth year] DOA: 02/16/2014  PCP: Octavio Graves, DO  Admit date: 02/16/2014 Discharge date: 02/17/2014  Time spent: >30 minutes  Recommendations for Outpatient Follow-up:  1. BMET to follow electrolytes and renal function 2. Reassess BP and adjust medications as needed  Discharge Diagnoses:  ALCOHOL ABUSE Avascular necrosis of right femoral head Hypertension Acute encephalopathy Acute sinusitis Acute kidney injury  Discharge Condition: stable and improved. Will discharge home with family care. Patient to follow with PCP in 1 week.  Diet recommendation: heart healthy diet  Filed Weights   02/16/14 1238 02/16/14 2029  Weight: 88.451 kg (195 lb) 96.1 kg (211 lb 13.8 oz)    History of present illness:  68 year old male with history of hypertension, alcohol abuse, chronic back pain, was brought to the hospital by the family for acute onset of confusion since this morning. As per his daughter is at bedside patient's wife noticed him to be quite confused this morning and was shaking for some time. His speech was slurred and he appeared disoriented. He said he reports that he was working outside in the heat to his back and feels that he has been feeling generalized weakness and some dizziness since then. This morning he felt his thoughts were clouded . Patient was not sure about his medications. His wife also noticed that he was unsteady when he walked. Patient has a cane which she does not use at home. He reports off and on headache for past 2 days. He also reports difficulty urinating today. He denies any fever, chills, nausea, vomiting, blurred vision, chest pain, palpitations, shortness of breath, abdominal pain, diarrhea. Denies any change in his weight or appetite. He denies any recent travel or sick contact. Patient was started on Celexa a few days back. Patient reports drinking alcohol about once or twice a week however  her daughter thinks she may be drinking more than that. He reports that his last drink was 2 days back but the daughter thinks he was probably drinking 2 days back when she met him.   Hospital Course:  Acute encephalopathy  Differential includes toxic-metabolic (meds, alcohol) versus TIA/CVA. No signs of systemic infection although he has acute maxillary sinusitis on head CT and MRI. No focal motor or sensory deficit to suggest TIA/CVA as well. -no abnormalities seen on telemetry  -Head CT and MRI negative for acute findings except sinusitis. Will obtain MRI/MRA brain to rule out acute stroke. -EEG was done, but pending at discharge. Hx and presentation unlikely for seizure. -normal TSH and negative RPR and HIV -B12 borderline low -pain meds and benzos adjusted to minimize use -patient advise to quit drinking  Alcohol abuse  Patient reports drinking less frequently and last drink 2 days back. -cessation counseling provided -patient w/o withdrawals during admission -will continue multivitamin to supplement thiamine and folic acid  Hypertension  Stable. Will resume Lotensin   Acute sinusitis  -Will treat with a course of Augmentin  -will also start claritin and flonase  Acute kidney injury  Due to dehydration -resolved with IVF's; Creatinine WNL at discharge  Avascular necrosis of right femoral head  Status post right hip surgery one year back and is on chronic Percocet and PRN flexeril.  Depression -continue celexa  Low B12 -will start B12 1015mg PO daily   Procedures:  EEG (pending)  See below for x-ray reports   Consultations:  None   Discharge Exam: Filed Vitals:   02/17/14 1427  BP: 158/93  Pulse:   Temp:   Resp: 20    General: AAOX3, no fever and no acute distress. Patient with nasal congestion on exam and with symptoms descriptions of PND. Cardiovascular: S1 and S2, no rub so rgallops Respiratory: good air movement, no wheezing, no crackles Abdomen:  soft, NT, ND, positive BS Neurologic: intact  Discharge Instructions You were cared for by a hospitalist during your hospital stay. If you have any questions about your discharge medications or the care you received while you were in the hospital after you are discharged, you can call the unit and asked to speak with the hospitalist on call if the hospitalist that took care of you is not available. Once you are discharged, your primary care physician will handle any further medical issues. Please note that NO REFILLS for any discharge medications will be authorized once you are discharged, as it is imperative that you return to your primary care physician (or establish a relationship with a primary care physician if you do not have one) for your aftercare needs so that they can reassess your need for medications and monitor your lab values.  Discharge Orders   Future Orders Complete By Expires   Diet - low sodium heart healthy  As directed    Discharge instructions  As directed        Medication List         ALPRAZolam 1 MG tablet  Commonly known as:  XANAX  Take 3 mg by mouth at bedtime.     amoxicillin-clavulanate 875-125 MG per tablet  Commonly known as:  AUGMENTIN  Take 1 tablet by mouth every 12 (twelve) hours.     benazepril 20 MG tablet  Commonly known as:  LOTENSIN  Take 20 mg by mouth daily.     citalopram 20 MG tablet  Commonly known as:  CELEXA  Take 20 mg by mouth daily.     cyanocobalamin 500 MCG tablet  Commonly known as:  V-R VITAMIN B-12  Take 2 tablets (1,000 mcg total) by mouth daily.     cyclobenzaprine 10 MG tablet  Commonly known as:  FLEXERIL  Take 10 mg by mouth 3 (three) times daily as needed for muscle spasms.     fluticasone 50 MCG/ACT nasal spray  Commonly known as:  FLONASE  Place 2 sprays into both nostrils daily.     loratadine 10 MG tablet  Commonly known as:  CLARITIN  Take 1 tablet (10 mg total) by mouth daily.     multivitamin with  minerals Tabs tablet  Take 1 tablet by mouth daily.     oxycodone 30 MG immediate release tablet  Commonly known as:  ROXICODONE  Take 1 tablet (30 mg total) by mouth every 6 (six) hours as needed for pain.     PROAIR HFA 108 (90 BASE) MCG/ACT inhaler  Generic drug:  albuterol  Inhale 2 puffs into the lungs daily.     Vitamin D (Ergocalciferol) 50000 UNITS Caps capsule  Commonly known as:  DRISDOL  Take 50,000 Units by mouth 3 (three) times a week.       No Known Allergies     Follow-up Information   Follow up with BUTLER, CYNTHIA, DO In 10 days.   Contact information:   [address] [place] [address] [phone]        The results of significant diagnostics from this hospitalization (including imaging, microbiology, ancillary and laboratory) are listed below for reference.    Significant Diagnostic  Studies: Dg Chest 2 View  02/16/2014   CLINICAL DATA:  Shortness of breath.  Anemia.  Sepsis workup.  EXAM: CHEST  2 VIEW  COMPARISON:  DG CHEST 2V dated 01/29/2014; DG RIBS UNILATERAL W/CHEST*L* dated 09/07/2013; DG CHEST 2 VIEW dated 11/20/2012.  FINDINGS: Cardiac silhouette upper normal in size, unchanged. Thoracic aorta mildly atherosclerotic, unchanged. Hilar and mediastinal contours otherwise unremarkable. Stable chronic elevation of the right hemidiaphragm with interposition of the hepatic flexure of the colon between the liver in the right hemidiaphragm. Lungs clear. Bronchovascular markings normal. Pulmonary vascularity normal. No visible pleural effusions. No pneumothorax. Visualized bony thorax intact apart from degenerative changes in the acromioclavicular joints.  IMPRESSION: No acute cardiopulmonary disease. Stable chronic elevation of the right hemidiaphragm.   Electronically Signed   By: Evangeline Dakin M.D.   On: 02/16/2014 14:17   Ct Head Wo Contrast  02/16/2014   CLINICAL DATA:  Altered mental status  EXAM: CT HEAD WITHOUT CONTRAST  TECHNIQUE: Contiguous  axial images were obtained from the base of the skull through the vertex without intravenous contrast.  COMPARISON:  None.  FINDINGS: Mild to moderate diffuse cortical atrophy. No abnormal attenuation identified to suggest hemorrhage, extra-axial fluid, or vascular territory infarct. No hydrocephalus or mass. The calvarium is intact. There are air-fluid levels in the bilateral maxillary sinuses. The remainder of the paranasal sinuses appear clear.  IMPRESSION: Findings consistent with acute bilateral maxillary sinusitis. Otherwise no acute findings.   Electronically Signed   By: Skipper Cliche M.D.   On: 02/16/2014 14:14   Mr Brain Wo Contrast  02/17/2014   CLINICAL DATA:  Confusion.  Slurred speech.  Headache.  Weakness.  EXAM: MRI HEAD WITHOUT CONTRAST  TECHNIQUE: Multiplanar, multiecho pulse sequences of the brain and surrounding structures were obtained without intravenous contrast.  COMPARISON:  02/16/2014.  FINDINGS: No evidence for acute infarction, hemorrhage, mass lesion, hydrocephalus, or extra-axial fluid. Mild atrophy. Mild subcortical and periventricular T2 and FLAIR hyperintensities, likely chronic microvascular ischemic change. 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 Flow voids are maintained. Small focus chronic hemorrhage right periventricular white matter. Unremarkable pituitary and cerebellar tonsils. No osseous lesions. Bilateral acute maxillary sinus disease. Negative orbits. No mastoid fluid. Good general agreement with prior CT.  IMPRESSION: Chronic changes as described. No acute intracranial findings. Acute bilateral maxillary sinusitis.   Electronically Signed   By: Rolla Flatten M.D.   On: 02/17/2014 10:24    Microbiology: Recent Results (from the past 240 hour(s))  CULTURE, BLOOD (ROUTINE X 2)     Status: None   Collection Time    02/16/14  4:22 PM      Result Value Ref Range Status   Specimen Description BLOOD LEFT HAND   Final   Special  Requests BOTTLES DRAWN AEROBIC AND ANAEROBIC 10CC   Final   Culture NO GROWTH 1 DAY   Final   Report Status PENDING   Incomplete  CULTURE, BLOOD (ROUTINE X 2)     Status: None   Collection Time    02/16/14  4:43 PM      Result Value Ref Range Status   Specimen Description BLOOD LEFT HAND   Final   Special Requests BOTTLES DRAWN AEROBIC AND ANAEROBIC 10CC   Final   Culture NO GROWTH 1 DAY   Final   Report Status PENDING   Incomplete     Labs: Basic Metabolic Panel:  Recent Labs Lab 02/16/14 1327 02/17/14 0514  NA 138 141  K 4.2 5.0  CL 102 105  CO2 26 29  GLUCOSE 113* 97  BUN 37* 25*  CREATININE 1.47* 0.99  CALCIUM 8.7 8.8   CBC:  Recent Labs Lab 02/16/14 1327  WBC 5.9  NEUTROABS 4.0  HGB 12.7*  HCT 39.6  MCV 100.0  PLT 159   Cardiac Enzymes:  Recent Labs Lab 02/16/14 1622  CKTOTAL 261*    Signed:  Barton Dubois  [facility] 02/17/2014, 3:12 PM

## 2014-02-17 NOTE — Progress Notes (Signed)
Patient discharged with instructions, prescriptions, and carenotes. She verbalized understanding via teach back method.  The patient left the floor with staff via w/c in stable condition.  

## 2014-02-17 NOTE — Evaluation (Signed)
Physical Therapy Evaluation Patient Details Name: [name] [name]. [place] MRN: [id number] DOB: [birth year] Today's Date: 02/17/2014   History of Present Illness  68 year old male with history of hypertension, alcohol abuse,  chronic back pain, was brought to the hospital by the family for acute onset of confusion.  Clinical Impression  Pt is Independent with mobility on the unit. Pt does not need any further therapy and is d/c from PT services. Pt is safe to ambulate in the halls and room until d/c home.    Follow Up Recommendations No PT follow up    Equipment Recommendations  None recommended by PT    Recommendations for Other Services       Precautions / Restrictions Precautions Precautions: None Restrictions Weight Bearing Restrictions: No      Mobility  Bed Mobility Overal bed mobility: Independent                Transfers Overall transfer level: Independent Equipment used: None                Ambulation/Gait Ambulation/Gait assistance: Independent Ambulation Distance (Feet): 250 Feet Assistive device: None Gait Pattern/deviations: WFL(Within Functional Limits)   Gait velocity interpretation: at or above normal speed for age/gender    Stairs            Wheelchair Mobility    Modified Rankin (Stroke Patients Only)       Balance Overall balance assessment: Independent                                           Pertinent Vitals/Pain     Home Living Family/patient expects to be discharged to:: Private residence Living Arrangements: Spouse/significant other Available Help at Discharge: Family Type of Home: House Home Access: Stairs to enter   [employer] of Steps: 1 Home Layout: Laundry or work area in basement;Two level Home Equipment: Bedside commode;Walker - 2 wheels      Prior Function Level of Independence: Independent               Hand Dominance        Extremity/Trunk Assessment   Upper Extremity Assessment: Defer to OT evaluation           Lower Extremity Assessment: Overall WFL for tasks assessed         Communication   Communication: No difficulties  Cognition Arousal/Alertness: Awake/alert Behavior During Therapy: WFL for tasks assessed/performed Overall Cognitive Status: Within Functional Limits for tasks assessed                      General Comments      Exercises        Assessment/Plan    PT Assessment Patent does not need any further PT services  PT Diagnosis     PT Problem List    PT Treatment Interventions     PT Goals (Current goals can be found in the Care Plan section) Acute Rehab PT Goals Patient Stated Goal: No OT goals needed    Frequency     Barriers to discharge        Co-evaluation               End of Session   Activity Tolerance: Patient tolerated treatment well Patient left: in bed;with call bell/phone within reach;with family/visitor present Nurse Communication: Mobility status (pt is Independent with mobility)  Functional Assessment Tool Used: clinical judgement Functional Limitation: Mobility: Walking and moving around Mobility: Walking and Moving Around Current Status [phone]): 0 percent impaired, limited or restricted Mobility: Walking and Moving Around Goal Status [phone]): 0 percent impaired, limited or restricted Mobility: Walking and Moving Around Discharge Status [phone]): 0 percent impaired, limited or restricted    Time: 1240-1255 PT Time Calculation (min): 15 min   Charges:   PT Evaluation $Initial PT Evaluation Tier I: 1 Procedure     PT G Codes:   Functional Assessment Tool Used: clinical judgement Functional Limitation: Mobility: Walking and moving around    [employer] 02/17/2014, 12:55 PM

## 2014-02-17 NOTE — Evaluation (Signed)
Occupational Therapy Evaluation Patient Details Name: [name] [name] MRN: [id number] DOB: 1945/11/17 Today's Date: 02/17/2014    History of Present Illness 68 year old male with history of hypertension, alcohol abuse,  chronic back pain, was brought to the hospital by the family for acute onset of confusion since this morning. As per his daughter is at bedside patient's wife noticed him to be quite confused this morning and was shaking for some time. His speech was slurred and he appeared disoriented. He said he reports that he was working outside in the heat to his back and feels that he has been feeling generalized weakness and some dizziness since then. This morning he felt his thoughts were clouded . Patient was not sure about his medications. His wife also noticed that he was unsteady when he walked. Patient has a cane which she does not use at home. He reports off and on headache for past 2 days. He also reports difficulty urinating today. He denies any fever, chills, nausea, vomiting, blurred vision, chest pain, palpitations, shortness of breath, abdominal pain, diarrhea. Denies any change in his weight or appetite. He denies any recent travel or sick contact. Patient was started on Celexa a few days back. Patient reports drinking alcohol about once or twice a week however her daughter thinks she may be drinking more than that. He reports that his last drink was 2 days back but the daughter thinks he was probably drinking 2 days back when she met him.   Clinical Impression   Pt is presenting to acute OT with above situation. He is currently presenting with baseline ROM and strength in BUE.  Pt verbalized having black spots in his vision at start of eval, but this spotting resolved in less than 10 minutes - pt later verbalized that the spotting may have been sun spots; the sun was very bright into room at start of session.  Pt demonstrated ability to feed self and verbalized ability to don/doff  socks with baseline difficulty.  Pt and wife do no currently have any concerns about pt's ability to complete ADLs at baseline after d/c.  Pt needs no further acute OT services at this time.    Follow Up Recommendations  No OT follow up    Equipment Recommendations       Recommendations for Other Services       Precautions / Restrictions Restrictions Weight Bearing Restrictions: No      Mobility Bed Mobility                  Transfers                      Balance                                            ADL Overall ADL's : At baseline                                             Vision                     Perception     Praxis      Pertinent Vitals/Pain Pt with 8/10 pain headache.  RN notified.  Hand Dominance Right   Extremity/Trunk Assessment Upper Extremity Assessment Upper Extremity Assessment: Overall WFL for tasks assessed (Pt did not demonstrate ROM, strength, or sensation deficits.)   Lower Extremity Assessment Lower Extremity Assessment: Defer to PT evaluation       Communication Communication Communication: No difficulties   Cognition Arousal/Alertness: Awake/alert Behavior During Therapy: WFL for tasks assessed/performed;Agitated (Agitated at start of session, but WFL with continued calming) Overall Cognitive Status: Within Functional Limits for tasks assessed                     General Comments       Exercises       Shoulder Instructions      Home Living Family/patient expects to be discharged to:: Private residence Living Arrangements: Spouse/significant other Available Help at Discharge: Family;Available 24 hours/day Type of Home: House Home Access: Stairs to enter [employer] of Steps: 1   Home Layout: Two level;Laundry or work area in basement [employer] of Steps: around 12 steps between floors. Bedroom on second floor.    Bathroom Shower/Tub: Tub/shower unit;Walk-in shower   Bathroom Toilet: Standard     Home Equipment: Bedside commode;Walker - 2 wheels (Pt states BSC is too small for him.)          Prior Functioning/Environment Level of Independence: Independent             OT Diagnosis:     OT Problem List:     OT Treatment/Interventions:      OT Goals(Current goals can be found in the care plan section) Acute Rehab OT Goals Patient Stated Goal: No OT goals needed OT Goal Formulation: With patient/family  OT Frequency:     Barriers to D/C:            Co-evaluation              End of Session    Activity Tolerance: Patient tolerated treatment well Patient left: in bed;with family/visitor present;with call bell/phone within reach   Time: 0826-0850 OT Time Calculation (min): 24 min Charges:  OT General Charges $OT Visit: 1 Procedure OT Evaluation $Initial OT Evaluation Tier I: 1 Procedure G-Codes:     Bea Graff, MS, OTR/L [phone]  02/17/2014, 8:59 AM

## 2014-02-17 NOTE — Progress Notes (Signed)
UR chart review completed.  

## 2014-02-18 NOTE — Procedures (Signed)
  [place] A. Merlene Laughter, MD     [email or web address]           HISTORY: This is a 68 year old man who presents with spells of confusion, slurred speech, disorientation and generalized weakness. This does not evaluate for possible seizures.  MEDICATIONS: Scheduled Meds: Continuous Infusions: PRN Meds:.    Prior to Admission medications   Medication Sig Start Date End Date Taking? Authorizing Provider  ALPRAZolam Duanne Moron) 1 MG tablet Take 3 mg by mouth at bedtime.     Historical Provider, MD  amoxicillin-clavulanate (AUGMENTIN) 875-125 MG per tablet Take 1 tablet by mouth every 12 (twelve) hours. 02/17/14 02/26/14  Barton Dubois, MD  benazepril (LOTENSIN) 20 MG tablet Take 20 mg by mouth daily.     Historical Provider, MD  citalopram (CELEXA) 20 MG tablet Take 20 mg by mouth daily. 02/10/14   Historical Provider, MD  cyanocobalamin (V-R VITAMIN B-12) 500 MCG tablet Take 2 tablets (1,000 mcg total) by mouth daily. 02/17/14   Barton Dubois, MD  cyclobenzaprine (FLEXERIL) 10 MG tablet Take 10 mg by mouth 3 (three) times daily as needed for muscle spasms.    Historical Provider, MD  fluticasone (FLONASE) 50 MCG/ACT nasal spray Place 2 sprays into both nostrils daily. 02/17/14   Barton Dubois, MD  loratadine (CLARITIN) 10 MG tablet Take 1 tablet (10 mg total) by mouth daily. 02/17/14   Barton Dubois, MD  Multiple Vitamin (MULTIVITAMIN WITH MINERALS) TABS tablet Take 1 tablet by mouth daily. 02/17/14   Barton Dubois, MD  oxycodone (ROXICODONE) 30 MG immediate release tablet Take 1 tablet (30 mg total) by mouth every 6 (six) hours as needed for pain. 02/17/14   Barton Dubois, MD  PROAIR HFA 108 (90 BASE) MCG/ACT inhaler Inhale 2 puffs into the lungs daily. 01/28/14   Historical Provider, MD  Vitamin D, Ergocalciferol, (DRISDOL) 50000 UNITS CAPS capsule Take 50,000 Units by mouth 3 (three) times a week.    Historical Provider, MD      ANALYSIS: A 16 channel recording using standard 10  20 measurements is conducted for 24 minutes. There is a well-formed posterior dominant rhythm of 9 a half hertz which attenuates with eye opening. There is beta activity observed in the frontal areas. The awake and drowsy activities are observed. Photic stimulation is carried out without abnormal changes in the background activity. There are no focal or lateralized slowing. There are no epileptiform activity observed.   IMPRESSION: 1. This a normal recording awake and drowsy states.      Kynzee Devinney A. Merlene Laughter, M.D.  Diplomate, Tax adviser of Psychiatry and Neurology ( Neurology).

## 2014-02-22 LAB — CULTURE, BLOOD (ROUTINE X 2)
Culture: NO GROWTH
Culture: NO GROWTH

## 2015-07-12 ENCOUNTER — Emergency Department (HOSPITAL_COMMUNITY): Payer: Medicare Other

## 2015-07-12 ENCOUNTER — Inpatient Hospital Stay (HOSPITAL_COMMUNITY): Payer: Medicare Other

## 2015-07-12 ENCOUNTER — Encounter (HOSPITAL_COMMUNITY): Payer: Self-pay | Admitting: Emergency Medicine

## 2015-07-12 ENCOUNTER — Inpatient Hospital Stay (HOSPITAL_COMMUNITY)
Admission: EM | Admit: 2015-07-12 | Discharge: 2015-07-15 | DRG: 871 | Disposition: A | Discharge status: Home / Self Care | Payer: Medicare Other | Attending: Internal Medicine | Admitting: Internal Medicine

## 2015-07-12 DIAGNOSIS — Z79899 Other long term (current) drug therapy: Secondary | ICD-10-CM

## 2015-07-12 DIAGNOSIS — Z96641 Presence of right artificial hip joint: Secondary | ICD-10-CM | POA: Diagnosis not present

## 2015-07-12 DIAGNOSIS — N179 Acute kidney failure, unspecified: Secondary | ICD-10-CM | POA: Diagnosis present

## 2015-07-12 DIAGNOSIS — G9341 Metabolic encephalopathy: Secondary | ICD-10-CM

## 2015-07-12 DIAGNOSIS — R531 Weakness: Secondary | ICD-10-CM

## 2015-07-12 DIAGNOSIS — R32 Unspecified urinary incontinence: Secondary | ICD-10-CM | POA: Diagnosis present

## 2015-07-12 DIAGNOSIS — J9601 Acute respiratory failure with hypoxia: Secondary | ICD-10-CM | POA: Diagnosis present

## 2015-07-12 DIAGNOSIS — J189 Pneumonia, unspecified organism: Secondary | ICD-10-CM | POA: Diagnosis present

## 2015-07-12 DIAGNOSIS — A419 Sepsis, unspecified organism: Secondary | ICD-10-CM | POA: Diagnosis present

## 2015-07-12 DIAGNOSIS — Z85828 Personal history of other malignant neoplasm of skin: Secondary | ICD-10-CM

## 2015-07-12 DIAGNOSIS — Z8711 Personal history of peptic ulcer disease: Secondary | ICD-10-CM | POA: Diagnosis not present

## 2015-07-12 DIAGNOSIS — Z8249 Family history of ischemic heart disease and other diseases of the circulatory system: Secondary | ICD-10-CM

## 2015-07-12 DIAGNOSIS — D649 Anemia, unspecified: Secondary | ICD-10-CM | POA: Diagnosis present

## 2015-07-12 DIAGNOSIS — I10 Essential (primary) hypertension: Secondary | ICD-10-CM | POA: Diagnosis present

## 2015-07-12 DIAGNOSIS — D72829 Elevated white blood cell count, unspecified: Secondary | ICD-10-CM | POA: Diagnosis present

## 2015-07-12 DIAGNOSIS — Z79891 Long term (current) use of opiate analgesic: Secondary | ICD-10-CM | POA: Diagnosis not present

## 2015-07-12 LAB — URINALYSIS, ROUTINE W REFLEX MICROSCOPIC
Glucose, UA: NEGATIVE mg/dL
HGB URINE DIPSTICK: NEGATIVE
KETONES UR: 15 mg/dL — AB
Leukocytes, UA: NEGATIVE
Nitrite: NEGATIVE
Protein, ur: NEGATIVE mg/dL
SPECIFIC GRAVITY, URINE: 1.015 (ref 1.005–1.030)
UROBILINOGEN UA: 1 mg/dL (ref 0.0–1.0)
pH: 5 (ref 5.0–8.0)

## 2015-07-12 LAB — CBC WITH DIFFERENTIAL/PLATELET
BASOS ABS: 0 10*3/uL (ref 0.0–0.1)
BASOS PCT: 0 %
EOS ABS: 0 10*3/uL (ref 0.0–0.7)
EOS PCT: 0 %
HEMATOCRIT: 46.3 % (ref 39.0–52.0)
Hemoglobin: 15 g/dL (ref 13.0–17.0)
Lymphocytes Relative: 5 %
Lymphs Abs: 1.2 10*3/uL (ref 0.7–4.0)
MCH: 32.1 pg (ref 26.0–34.0)
MCHC: 32.4 g/dL (ref 30.0–36.0)
MCV: 98.9 fL (ref 78.0–100.0)
MONO ABS: 1.6 10*3/uL — AB (ref 0.1–1.0)
MONOS PCT: 7 %
NEUTROS ABS: 21.1 10*3/uL — AB (ref 1.7–7.7)
Neutrophils Relative %: 88 %
PLATELETS: 184 10*3/uL (ref 150–400)
RBC: 4.68 MIL/uL (ref 4.22–5.81)
RDW: 13.2 % (ref 11.5–15.5)
WBC: 23.8 10*3/uL — ABNORMAL HIGH (ref 4.0–10.5)

## 2015-07-12 LAB — I-STAT CHEM 8, ED
BUN: 31 mg/dL — AB (ref 6–20)
CALCIUM ION: 1.1 mmol/L — AB (ref 1.13–1.30)
CREATININE: 1.4 mg/dL — AB (ref 0.61–1.24)
Chloride: 100 mmol/L — ABNORMAL LOW (ref 101–111)
Glucose, Bld: 122 mg/dL — ABNORMAL HIGH (ref 65–99)
HEMATOCRIT: 50 % (ref 39.0–52.0)
HEMOGLOBIN: 17 g/dL (ref 13.0–17.0)
Potassium: 4.1 mmol/L (ref 3.5–5.1)
Sodium: 137 mmol/L (ref 135–145)
TCO2: 26 mmol/L (ref 0–100)

## 2015-07-12 LAB — INFLUENZA PANEL BY PCR (TYPE A & B)
H1N1 flu by pcr: NOT DETECTED
INFLAPCR: NEGATIVE
Influenza B By PCR: NEGATIVE

## 2015-07-12 LAB — LACTIC ACID, PLASMA
Lactic Acid, Venous: 2.8 mmol/L (ref 0.5–2.0)
Lactic Acid, Venous: 3.2 mmol/L (ref 0.5–2.0)

## 2015-07-12 LAB — I-STAT ARTERIAL BLOOD GAS, ED
ACID-BASE DEFICIT: 3 mmol/L — AB (ref 0.0–2.0)
Bicarbonate: 22.8 mEq/L (ref 20.0–24.0)
O2 Saturation: 91 %
PCO2 ART: 41.1 mmHg (ref 35.0–45.0)
PH ART: 7.352 (ref 7.350–7.450)
PO2 ART: 63 mmHg — AB (ref 80.0–100.0)
TCO2: 24 mmol/L (ref 0–100)

## 2015-07-12 LAB — COMPREHENSIVE METABOLIC PANEL
ALT: 21 U/L (ref 17–63)
AST: 36 U/L (ref 15–41)
Albumin: 3.5 g/dL (ref 3.5–5.0)
Alkaline Phosphatase: 65 U/L (ref 38–126)
Anion gap: 10 (ref 5–15)
BUN: 22 mg/dL — AB (ref 6–20)
CHLORIDE: 103 mmol/L (ref 101–111)
CO2: 26 mmol/L (ref 22–32)
CREATININE: 1.63 mg/dL — AB (ref 0.61–1.24)
Calcium: 9.3 mg/dL (ref 8.9–10.3)
GFR calc non Af Amer: 41 mL/min — ABNORMAL LOW (ref >60)
GFR, EST AFRICAN AMERICAN: 48 mL/min — AB (ref >60)
Glucose, Bld: 124 mg/dL — ABNORMAL HIGH (ref 65–99)
POTASSIUM: 4.2 mmol/L (ref 3.5–5.1)
SODIUM: 139 mmol/L (ref 135–145)
Total Bilirubin: <0.3 mg/dL — ABNORMAL LOW (ref 0.3–1.2)
Total Protein: 6.2 g/dL — ABNORMAL LOW (ref 6.5–8.1)

## 2015-07-12 LAB — I-STAT CG4 LACTIC ACID, ED: LACTIC ACID, VENOUS: 3.84 mmol/L — AB (ref 0.5–2.0)

## 2015-07-12 LAB — LIPASE, BLOOD: LIPASE: 13 U/L — AB (ref 22–51)

## 2015-07-12 LAB — TROPONIN I: Troponin I: <0.03 ng/mL (ref <0.031)

## 2015-07-12 LAB — PROCALCITONIN: Procalcitonin: 21.78 ng/mL

## 2015-07-12 LAB — ETHANOL: Alcohol, Ethyl (B): <5 mg/dL (ref <5)

## 2015-07-12 LAB — GLUCOSE, CAPILLARY: GLUCOSE-CAPILLARY: 99 mg/dL (ref 65–99)

## 2015-07-12 LAB — PROTIME-INR
INR: 1.16 (ref 0.00–1.49)
Prothrombin Time: 14.9 seconds (ref 11.6–15.2)

## 2015-07-12 LAB — MRSA PCR SCREENING: MRSA by PCR: NEGATIVE

## 2015-07-12 LAB — STREP PNEUMONIAE URINARY ANTIGEN: STREP PNEUMO URINARY ANTIGEN: NEGATIVE

## 2015-07-12 MED ORDER — VANCOMYCIN HCL IN DEXTROSE 750-5 MG/150ML-% IV SOLN
750.0000 mg | Freq: Two times a day (BID) | INTRAVENOUS | Status: DC
  Filled 2015-07-12: qty 150

## 2015-07-12 MED ORDER — SODIUM CHLORIDE 0.9 % IV BOLUS (SEPSIS)
1000.0000 mL | Freq: Once | INTRAVENOUS | Status: AC
  Administered 2015-07-12: 1000 mL via INTRAVENOUS

## 2015-07-12 MED ORDER — PIPERACILLIN-TAZOBACTAM 3.375 G IVPB 30 MIN
3.3750 g | Freq: Once | INTRAVENOUS | Status: AC
  Administered 2015-07-12: 3.375 g via INTRAVENOUS
  Filled 2015-07-12: qty 50

## 2015-07-12 MED ORDER — CETYLPYRIDINIUM CHLORIDE 0.05 % MT LIQD: 7.0000 mL | Freq: Two times a day (BID) | OROMUCOSAL | Status: DC

## 2015-07-12 MED ORDER — DEXTROSE 5 % IV SOLN
1.0000 g | INTRAVENOUS | Status: DC
  Administered 2015-07-13 – 2015-07-15 (×3): 1 g via INTRAVENOUS
  Filled 2015-07-12 (×4): qty 10

## 2015-07-12 MED ORDER — ENOXAPARIN SODIUM 40 MG/0.4ML ~~LOC~~ SOLN
40.0000 mg | SUBCUTANEOUS | Status: DC
  Administered 2015-07-12 – 2015-07-14 (×3): 40 mg via SUBCUTANEOUS
  Filled 2015-07-12 (×3): qty 0.4

## 2015-07-12 MED ORDER — SODIUM CHLORIDE 0.9 % IV SOLN
500.0000 mL | Freq: Once | INTRAVENOUS | Status: AC
  Administered 2015-07-12: 500 mL via INTRAVENOUS

## 2015-07-12 MED ORDER — CETYLPYRIDINIUM CHLORIDE 0.05 % MT LIQD
7.0000 mL | Freq: Two times a day (BID) | OROMUCOSAL | Status: DC
  Administered 2015-07-13 – 2015-07-15 (×6): 7 mL via OROMUCOSAL

## 2015-07-12 MED ORDER — VANCOMYCIN HCL IN DEXTROSE 1-5 GM/200ML-% IV SOLN: 1000.0000 mg | Freq: Once | INTRAVENOUS | Status: DC

## 2015-07-12 MED ORDER — CHLORHEXIDINE GLUCONATE 0.12 % MT SOLN
15.0000 mL | Freq: Two times a day (BID) | OROMUCOSAL | Status: DC
  Administered 2015-07-12 – 2015-07-14 (×5): 15 mL via OROMUCOSAL
  Filled 2015-07-12 (×3): qty 15

## 2015-07-12 MED ORDER — IOHEXOL 300 MG/ML  SOLN
100.0000 mL | Freq: Once | INTRAMUSCULAR | Status: AC | PRN
  Administered 2015-07-12: 100 mL via INTRAVENOUS

## 2015-07-12 MED ORDER — INFLUENZA VAC SPLIT QUAD 0.5 ML IM SUSY
0.5000 mL | PREFILLED_SYRINGE | INTRAMUSCULAR | Status: AC
  Administered 2015-07-13: 0.5 mL via INTRAMUSCULAR
  Filled 2015-07-12: qty 0.5

## 2015-07-12 MED ORDER — SODIUM CHLORIDE 0.9 % IV SOLN
INTRAVENOUS | Status: DC
  Administered 2015-07-12 – 2015-07-13 (×2): via INTRAVENOUS

## 2015-07-12 MED ORDER — ACETAMINOPHEN 650 MG RE SUPP
650.0000 mg | Freq: Once | RECTAL | Status: AC
  Administered 2015-07-12: 650 mg via RECTAL
  Filled 2015-07-12: qty 1

## 2015-07-12 MED ORDER — ACETAMINOPHEN 650 MG RE SUPP: 650.0000 mg | RECTAL | Status: DC | PRN

## 2015-07-12 MED ORDER — DEXTROSE 5 % IV SOLN: 500.0000 mg | INTRAVENOUS | Status: DC

## 2015-07-12 MED ORDER — DEXTROSE 5 % IV SOLN: 1.0000 g | INTRAVENOUS | Status: DC

## 2015-07-12 MED ORDER — DEXTROSE 5 % IV SOLN
500.0000 mg | INTRAVENOUS | Status: DC
  Administered 2015-07-13 – 2015-07-15 (×3): 500 mg via INTRAVENOUS
  Filled 2015-07-12 (×4): qty 500

## 2015-07-12 NOTE — ED Notes (Signed)
Dr. Llana Aliment at bedside.

## 2015-07-12 NOTE — H&P (Signed)
PULMONARY / CRITICAL CARE MEDICINE   Name: [name] [name]. [name] [name] MRN: [id number] DOB: 1946/05/14    ADMISSION DATE:  07/12/2015  REFERRING MD :  ER  CHIEF COMPLAINT:  Altered mental status  INITIAL PRESENTATION:  69 yo male brought from home with altered mental status, incontinence, hypoxia, hypotension from sepsis and PNA.  STUDIES:   SIGNIFICANT EVENTS: 10/02 Admit   HISTORY OF PRESENT ILLNESS:   Hx from ER staff and chart.  Limit hx from pt.  Family reported to ER staff that pt was getting more sleepy for last day.  He was having back pain, cough, and ?coughed up some blood.  He was brought to ER.  Found to be hypoxic and low BP.  He was started on Abx, and IV fluids.  He has been feeling nauseous.  He feels short of breath and thirsty.  He denies abdominal pain or diarrhea.  He used to drink alcohol, but denies any drinking for past two years.  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 Prior to Admission medications   Medication Sig Start Date End Date Taking? Authorizing Provider  ALPRAZolam Duanne Moron) 1 MG tablet Take 3 mg by mouth at bedtime.     Historical Provider, MD  benazepril (LOTENSIN) 20 MG tablet Take 20 mg by mouth daily.     Historical Provider, MD  citalopram (CELEXA) 20 MG tablet Take 20 mg by mouth daily. 02/10/14   Historical Provider, MD  cyanocobalamin (V-R VITAMIN B-12) 500 MCG tablet Take 2 tablets (1,000 mcg total) by mouth daily. 02/17/14   Barton Dubois, MD  cyclobenzaprine (FLEXERIL) 10 MG tablet Take 10 mg by mouth 3 (three) times daily as needed for muscle spasms.    Historical Provider, MD  fluticasone (FLONASE) 50 MCG/ACT nasal spray Place 2 sprays into both  nostrils daily. 02/17/14   Barton Dubois, MD  loratadine (CLARITIN) 10 MG tablet Take 1 tablet (10 mg total) by mouth daily. 02/17/14   Barton Dubois, MD  Multiple Vitamin (MULTIVITAMIN WITH MINERALS) TABS tablet Take 1 tablet by mouth daily. 02/17/14   Barton Dubois, MD  oxycodone (ROXICODONE) 30 MG immediate release tablet Take 1 tablet (30 mg total) by mouth every 6 (six) hours as needed for pain. 02/17/14   Barton Dubois, MD  PROAIR HFA 108 (90 BASE) MCG/ACT inhaler Inhale 2 puffs into the lungs daily. 01/28/14   Historical Provider, MD  Vitamin D, Ergocalciferol, (DRISDOL) 50000 UNITS CAPS capsule Take 50,000 Units by mouth 3 (three) times a week.    Historical Provider, MD   No Known Allergies  FAMILY HISTORY:  has no family status information on file.  SOCIAL HISTORY:  reports that he has never smoked. He does not have any smokeless tobacco history on file. He reports that he does not drink alcohol or use illicit drugs.  REVIEW OF SYSTEMS:  Negative except above.  SUBJECTIVE:   VITAL SIGNS: Temp:  [97.9 F (36.6 C)-101.8 F (38.8 C)] 101.8 F (38.8 C) (10/02 1637) Pulse Rate:  [118] 118 (10/02 1546) Resp:  [22] 22 (10/02 1546) BP: (104)/(62) 104/62 mmHg (10/02 1546) SpO2:  [85 %-92 %] 92 % (10/02 1606) Weight:  [190 lb (86.183 kg)] 190 lb (86.183 kg) (10/02 1700) INTAKE / OUTPUT: No intake  or output data in the 24 hours ending 07/12/15 1724  PHYSICAL EXAMINATION: General:  Ill appearing Neuro:  Lethargic, wakes up with stimulation, moves extremities, follows commands, CN intact HEENT:  Pupils reactive, dry oral mucosa, cheliosis Cardiovascular:  Regular, tachycardic, no murmur Lungs:  Faint b/l crackles, decreased BS at bases, no wheeze Abdomen:  Soft, non tender Musculoskeletal:  No edema Skin:  No rashes  LABS:  CBC  Recent Labs Lab 07/12/15 1643 07/12/15 1701  WBC  --  23.8*  HGB 17.0 15.0  HCT 50.0 46.3  PLT  --  184   Coag's  Recent Labs Lab  07/12/15 1614  INR 1.16     BMET  Recent Labs Lab 07/12/15 1643  NA 137  K 4.1  CL 100*  BUN 31*  CREATININE 1.40*  GLUCOSE 122*   Electrolytes No results for input(s): CALCIUM, MG, PHOS in the last 168 hours.   Sepsis Markers  Recent Labs Lab 07/12/15 1643  LATICACIDVEN 3.84*   ABG No results for input(s): PHART, PCO2ART, PO2ART in the last 168 hours.   Liver Enzymes No results for input(s): AST, ALT, ALKPHOS, BILITOT, ALBUMIN in the last 168 hours.   Cardiac Enzymes No results for input(s): TROPONINI, PROBNP in the last 168 hours.   Glucose No results for input(s): GLUCAP in the last 168 hours.  Imaging Dg Chest Portable 1 View  07/12/2015   CLINICAL DATA:  Shortness of breath and lethargy.  EXAM: PORTABLE CHEST 1 VIEW  COMPARISON:  06/01/2015 and prior chest radiographs  FINDINGS: The is a very low volume film.  Bibasilar atelectasis is noted.  Elevation of the right hemidiaphragm is again noted.  There is no evidence of pneumothorax.  No acute bony abnormalities are identified.  IMPRESSION: Very low lung volumes with bibasilar atelectasis.   Electronically Signed   By: Margarette Canada M.D.   On: 07/12/2015 16:58     ASSESSMENT / PLAN:  PULMONARY A: Acute respiratory failure likely from pneumonia. Chronic Rt hemidiaphragm elevation. P:   Oxygen to keep SpO2 > 92% BiPAP prn >> might need intubation F/u CXR, ABG  CARDIOVASCULAR A:  Sepsis likely from PNA. Hx of HTN. P:  Continue IV fluids F/u lactic acid, procalcitonin Hold outpt benazepril  RENAL A:   AKI >> baseline creatinine 0.99 02/17/14. P:   Monitor renal fx, urine outpt  GASTROINTESTINAL A:  Nutrition. Hx of PUD. P:   NPO until mental status improved Protonix  HEMATOLOGIC A:   No acute issues. P:  F/u CBC Lovenox for DVT prevention  INFECTIOUS A:   Sepsis likely from PNA. P:   Day 1 of Abx, rocephin, zithromax  Blood 10/02 >> Urine 10/02 >> Urine legionella 10/02  >> Urine pneumococcal 10/02 >> Influenza 10/02 >>  ENDOCRINE A:   No acute issues. P:   Monitor blood sugar on BMET  NEUROLOGIC A:   Acute encephalopathy 2nd to sepsis. P:   Monitor mental status Hold outpt xanax, celexa, flexeril, oxycodone   CC time 33 minutes.  Chesley Mires, MD [facility] Pulmonary/Critical Care 07/12/2015, 5:24 PM Pager:  [phone] After 3pm call: [phone]

## 2015-07-12 NOTE — Progress Notes (Signed)
Pt arrived to unit from ER. Arrived on 5L [place]. Arouses with verbal stimulation. Fully alert and able to answer questions appropriately when awake. Denies complaints of pain.

## 2015-07-12 NOTE — Progress Notes (Signed)
Critical lactic of 3.2 was reported to [place]. MD will follow up.

## 2015-07-12 NOTE — ED Notes (Signed)
[facility] notified to page Code Sepsis

## 2015-07-12 NOTE — ED Notes (Signed)
MD at bedside. 

## 2015-07-12 NOTE — ED Notes (Signed)
this RN called lab regarding CBC

## 2015-07-12 NOTE — ED Notes (Signed)
Pt appears lethargic and sts back pain and hemoptysis today; pt sats noted to be 84%

## 2015-07-12 NOTE — ED Notes (Signed)
Pt transported to CT and room with this RN on monitor. Pt tolerated well.

## 2015-07-13 ENCOUNTER — Inpatient Hospital Stay (HOSPITAL_COMMUNITY): Payer: Medicare Other

## 2015-07-13 DIAGNOSIS — A419 Sepsis, unspecified organism: Secondary | ICD-10-CM | POA: Diagnosis not present

## 2015-07-13 LAB — COMPREHENSIVE METABOLIC PANEL
ALBUMIN: 2.5 g/dL — AB (ref 3.5–5.0)
ALK PHOS: 50 U/L (ref 38–126)
ALT: 18 U/L (ref 17–63)
ANION GAP: 5 (ref 5–15)
AST: 28 U/L (ref 15–41)
BILIRUBIN TOTAL: 1.4 mg/dL — AB (ref 0.3–1.2)
BUN: 19 mg/dL (ref 6–20)
CALCIUM: 8 mg/dL — AB (ref 8.9–10.3)
CO2: 27 mmol/L (ref 22–32)
CREATININE: 0.87 mg/dL (ref 0.61–1.24)
Chloride: 108 mmol/L (ref 101–111)
GFR calc non Af Amer: >60 mL/min (ref >60)
GLUCOSE: 107 mg/dL — AB (ref 65–99)
Potassium: 4.1 mmol/L (ref 3.5–5.1)
Sodium: 140 mmol/L (ref 135–145)
TOTAL PROTEIN: 4.7 g/dL — AB (ref 6.5–8.1)

## 2015-07-13 LAB — CBC
HCT: 38.1 % — ABNORMAL LOW (ref 39.0–52.0)
HEMOGLOBIN: 12.1 g/dL — AB (ref 13.0–17.0)
MCH: 31.2 pg (ref 26.0–34.0)
MCHC: 31.8 g/dL (ref 30.0–36.0)
MCV: 98.2 fL (ref 78.0–100.0)
PLATELETS: 155 10*3/uL (ref 150–400)
RBC: 3.88 MIL/uL — ABNORMAL LOW (ref 4.22–5.81)
RDW: 13.7 % (ref 11.5–15.5)
WBC: 15 10*3/uL — ABNORMAL HIGH (ref 4.0–10.5)

## 2015-07-13 LAB — URINE CULTURE: CULTURE: NO GROWTH

## 2015-07-13 LAB — PROCALCITONIN: PROCALCITONIN: 18.14 ng/mL

## 2015-07-13 MED ORDER — CITALOPRAM HYDROBROMIDE 20 MG PO TABS
20.0000 mg | ORAL_TABLET | Freq: Every day | ORAL | Status: DC
  Administered 2015-07-14 – 2015-07-15 (×2): 20 mg via ORAL
  Filled 2015-07-13 (×3): qty 1

## 2015-07-13 MED ORDER — BENAZEPRIL HCL 20 MG PO TABS
20.0000 mg | ORAL_TABLET | Freq: Every day | ORAL | Status: DC
  Administered 2015-07-14 – 2015-07-15 (×2): 20 mg via ORAL
  Filled 2015-07-13 (×2): qty 1

## 2015-07-13 MED ORDER — ALPRAZOLAM 0.5 MG PO TABS: 3.0000 mg | ORAL_TABLET | Freq: Every day | ORAL | Status: DC

## 2015-07-13 MED ORDER — CYCLOBENZAPRINE HCL 10 MG PO TABS: 10.0000 mg | ORAL_TABLET | Freq: Three times a day (TID) | ORAL | Status: DC | PRN

## 2015-07-13 MED ORDER — ACETAMINOPHEN 325 MG PO TABS: 650.0000 mg | ORAL_TABLET | Freq: Four times a day (QID) | ORAL | Status: DC | PRN

## 2015-07-13 MED ORDER — OXYCODONE HCL 5 MG PO TABS
30.0000 mg | ORAL_TABLET | Freq: Four times a day (QID) | ORAL | Status: DC | PRN
  Administered 2015-07-13 – 2015-07-15 (×4): 30 mg via ORAL
  Filled 2015-07-13 (×4): qty 6

## 2015-07-13 MED ORDER — BENAZEPRIL HCL 20 MG PO TABS: 20.0000 mg | ORAL_TABLET | Freq: Every day | ORAL | Status: DC

## 2015-07-13 MED ORDER — TAMSULOSIN HCL 0.4 MG PO CAPS
0.4000 mg | ORAL_CAPSULE | Freq: Every evening | ORAL | Status: DC
  Administered 2015-07-13 – 2015-07-14 (×2): 0.4 mg via ORAL
  Filled 2015-07-13 (×2): qty 1

## 2015-07-13 MED ORDER — HYDRALAZINE HCL 20 MG/ML IJ SOLN: 10.0000 mg | INTRAMUSCULAR | Status: DC | PRN

## 2015-07-13 MED ORDER — ALPRAZOLAM 0.5 MG PO TABS
0.5000 mg | ORAL_TABLET | Freq: Every day | ORAL | Status: DC
  Administered 2015-07-13: 0.5 mg via ORAL
  Filled 2015-07-13: qty 1

## 2015-07-13 NOTE — Progress Notes (Signed)
PULMONARY / CRITICAL CARE MEDICINE   Name: [name] [name]. [place] MRN: [id number] DOB: July 28, 1946    ADMISSION DATE:  07/12/2015  REFERRING MD :  ER  CHIEF COMPLAINT:  Altered mental status  INITIAL PRESENTATION:  69 yo male brought from home with altered mental status, incontinence, hypoxia, hypotension from sepsis and PNA.  STUDIES:   SIGNIFICANT EVENTS: 10/02 Admit  SUBJECTIVE:  Feels better.  Doesn't remember what happened yesterday.  Still has cough and congestion.  VITAL SIGNS: Temp:  [97.9 F (36.6 C)-101.8 F (38.8 C)] 99.3 F (37.4 C) (10/03 1100) Pulse Rate:  [88-118] 102 (10/03 1300) Resp:  [20-43] 23 (10/03 1300) BP: (75-177)/(45-82) 177/81 mmHg (10/03 1300) SpO2:  [85 %-100 %] 98 % (10/03 1300) Weight:  [190 lb (86.183 kg)-192 lb 0.3 oz (87.1 kg)] 192 lb 0.3 oz (87.1 kg) (10/03 0500) INTAKE / OUTPUT:  Intake/Output Summary (Last 24 hours) at 07/13/15 1425 Last data filed at 07/13/15 1300  Gross per 24 hour  Intake   6075 ml  Output   2150 ml  Net   3925 ml    PHYSICAL EXAMINATION: General: pleasant Neuro: follows commands, CN intact HEENT:  Pupils reactive Cardiovascular:  Regular, tachycardic, no murmur Lungs:  Faint b/l crackles, decreased BS at bases, no wheeze Abdomen:  Soft, non tender Musculoskeletal:  No edema Skin:  No rashes  LABS:  CBC  Recent Labs Lab 07/12/15 1643 07/12/15 1701 07/13/15 0519  WBC  --  23.8* 15.0*  HGB 17.0 15.0 12.1*  HCT 50.0 46.3 38.1*  PLT  --  184 155   Coag's  Recent Labs Lab 07/12/15 1614  INR 1.16     BMET  Recent Labs Lab 07/12/15 1614 07/12/15 1643 07/13/15 0519  NA 139 137 140  K 4.2 4.1 4.1  CL 103 100* 108  CO2 26  --  27  BUN 22* 31* 19  CREATININE 1.63* 1.40* 0.87  GLUCOSE 124* 122* 107*   Electrolytes  Recent Labs Lab 07/12/15 1614 07/13/15 0519  CALCIUM 9.3 8.0*     Sepsis Markers  Recent Labs Lab 07/12/15 1643 07/12/15 1725 07/12/15 2012 07/13/15 0519   LATICACIDVEN 3.84* 2.8* 3.2*  --   PROCALCITON  --  21.78  --  18.14   ABG  Recent Labs Lab 07/12/15 1737  PHART 7.352  PCO2ART 41.1  PO2ART 63.0*     Liver Enzymes  Recent Labs Lab 07/12/15 1614 07/13/15 0519  AST 36 28  ALT 21 18  ALKPHOS 65 50  BILITOT <0.3* 1.4*  ALBUMIN 3.5 2.5*     Cardiac Enzymes  Recent Labs Lab 07/12/15 1614  TROPONINI <0.03     Glucose  Recent Labs Lab 07/12/15 1836  GLUCAP 99    Imaging Ct Chest W Contrast  07/12/2015   CLINICAL DATA:  Sepsis, back pain, hemoptysis  EXAM: CT CHEST, ABDOMEN, AND PELVIS WITH CONTRAST  TECHNIQUE: Multidetector CT imaging of the chest, abdomen and pelvis was performed following the standard protocol during bolus administration of intravenous contrast.  CONTRAST:  127mL OMNIPAQUE IOHEXOL 300 MG/ML  SOLN  COMPARISON:  None.  FINDINGS: CT CHEST FINDINGS  Mediastinum/Nodes: No lymphadenopathy. Normal heart size. No mediastinal hematoma. Normal esophagus. Normal caliber thoracic aorta. Mild coronary artery atherosclerosis in the LAD and RCA.  Lungs/Pleura: No significant pleural effusion. Dense right lower lobe consolidation. Patchy areas of airspace disease in the right upper lobe, left upper lobe and left lower lobe. No pneumothorax.  Musculoskeletal: No acute osseous abnormality. No  aggressive lytic or sclerotic osseous lesion.  CT ABDOMEN PELVIS FINDINGS  Hepatobiliary: Low attenuation of the liver as can be seen with hepatic steatosis. No focal hepatic mass. No intrahepatic or extrahepatic biliary ductal dilatation. Cholelithiasis.  Pancreas: Atrophic pancreas.  No focal pancreatic mass.  Spleen: Normal.  Adrenals/Urinary Tract: Normal adrenal glands. Normal kidneys. No urolithiasis or obstructive uropathy. Mild nonspecific bilateral perinephric stranding. Normal partially decompressed bladder.  Stomach/Bowel: No bowel dilatation or bowel wall thickening. No pneumatosis, pneumoperitoneum or portal venous gas. No  abdominal or pelvic free fluid. Fact containing right inguinal hernia.  Vascular/Lymphatic: Normal caliber abdominal aorta with atherosclerosis. No abdominal or pelvic lymphadenopathy.  Other: No fluid collection or hematoma.  Musculoskeletal: No aggressive lytic or sclerotic osseous lesion. No acute osseous abnormality. 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  IMPRESSION: 1. Bilateral multilobar pneumonia, most severe in the right lower lobe. 2. No acute abdominal or pelvic pathology. 3. Hepatic steatosis.   Electronically Signed   By: Kathreen Devoid   On: 07/12/2015 18:35   Ct Abdomen Pelvis W Contrast  07/12/2015   CLINICAL DATA:  Sepsis, back pain, hemoptysis  EXAM: CT CHEST, ABDOMEN, AND PELVIS WITH CONTRAST  TECHNIQUE: Multidetector CT imaging of the chest, abdomen and pelvis was performed following the standard protocol during bolus administration of intravenous contrast.  CONTRAST:  155mL OMNIPAQUE IOHEXOL 300 MG/ML  SOLN  COMPARISON:  None.  FINDINGS: CT CHEST FINDINGS  Mediastinum/Nodes: No lymphadenopathy. Normal heart size. No mediastinal hematoma. Normal esophagus. Normal caliber thoracic aorta. Mild coronary artery atherosclerosis in the LAD and RCA.  Lungs/Pleura: No significant pleural effusion. Dense right lower lobe consolidation. Patchy areas of airspace disease in the right upper lobe, left upper lobe and left lower lobe. No pneumothorax.  Musculoskeletal: No acute osseous abnormality. No aggressive lytic or sclerotic osseous lesion.  CT ABDOMEN PELVIS FINDINGS  Hepatobiliary: Low attenuation of the liver as can be seen with hepatic steatosis. No focal hepatic mass. No intrahepatic or extrahepatic biliary ductal dilatation. Cholelithiasis.  Pancreas: Atrophic pancreas.  No focal pancreatic mass.  Spleen: Normal.  Adrenals/Urinary Tract: Normal adrenal glands. Normal kidneys. No urolithiasis or obstructive  uropathy. Mild nonspecific bilateral perinephric stranding. Normal partially decompressed bladder.  Stomach/Bowel: No bowel dilatation or bowel wall thickening. No pneumatosis, pneumoperitoneum or portal venous gas. No abdominal or pelvic free fluid. Fact containing right inguinal hernia.  Vascular/Lymphatic: Normal caliber abdominal aorta with atherosclerosis. No abdominal or pelvic lymphadenopathy.  Other: No fluid collection or hematoma.  Musculoskeletal: No aggressive lytic or sclerotic osseous lesion. No acute osseous abnormality. 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  IMPRESSION: 1. Bilateral multilobar pneumonia, most severe in the right lower lobe. 2. No acute abdominal or pelvic pathology. 3. Hepatic steatosis.   Electronically Signed   By: Kathreen Devoid   On: 07/12/2015 18:35   Dg Chest Port 1 View  07/13/2015   CLINICAL DATA:  Pneumonia.  EXAM: PORTABLE CHEST 1 VIEW  COMPARISON:  CT 07/12/2015.  Chest x-ray 07/12/2015 .  FINDINGS: Persistent multifocal pulmonary infiltrates consistent with pneumonia. Stable cardiomegaly. No pleural effusion or pneumothorax. Air-filled loop of bowel noted in the right upper quadrant. Stable elevation right hemidiaphragm. No acute osseous abnormality.  IMPRESSION: Persistent multifocal pneumonia.  No interim change.   Electronically Signed   By: Marcello Moores  Register   On: 07/13/2015 07:20   Dg Chest Portable 1  View  07/12/2015   CLINICAL DATA:  Shortness of breath and lethargy.  EXAM: PORTABLE CHEST 1 VIEW  COMPARISON:  06/01/2015 and prior chest radiographs  FINDINGS: The is a very low volume film.  Bibasilar atelectasis is noted.  Elevation of the right hemidiaphragm is again noted.  There is no evidence of pneumothorax.  No acute bony abnormalities are identified.  IMPRESSION: Very low lung volumes with bibasilar atelectasis.   Electronically Signed   By: Margarette Canada M.D.   On:  07/12/2015 16:58     ASSESSMENT / PLAN:  PULMONARY A: Acute respiratory failure community acquired pneumonia. Chronic Rt hemidiaphragm elevation. P:   Oxygen to keep SpO2 > 92% F/u CXR intermittently  CARDIOVASCULAR A:  Sepsis likely from PNA. Hx of HTN. P:  KVO IV fluids F/u procalcitonin Resume benazepril 10/04  RENAL A:   AKI >> baseline creatinine 0.99 02/17/14 >> resolved. P:   Monitor renal fx, urine outpt  GASTROINTESTINAL A:  Nutrition. Hx of PUD. P:   Advance diet Protonix  HEMATOLOGIC A:   No acute issues. P:  F/u CBC Lovenox for DVT prevention  INFECTIOUS A:   Sepsis likely from PNA. P:   Day 2 of Abx, rocephin, zithromax  Blood 10/02 >> Urine 10/02 >> negative Urine legionella 10/02 >> Urine pneumococcal 10/02 >> negative Influenza 10/02 >> negative  ENDOCRINE A:   No acute issues. P:   Monitor blood sugar on BMET  NEUROLOGIC A:   Acute encephalopathy 2nd to sepsis >> much improved 10/03. P:   Monitor mental status Hold outpt xanax, celexa, flexeril, oxycodone  Updated pt's family at bedside.  Transfer to SDU 10/03.  Will ask [facility] to assume care 10/04 and PCCM off.   Chesley Mires, MD [facility] Pulmonary/Critical Care 07/13/2015, 2:25 PM Pager:  [phone] After 3pm call: [phone]

## 2015-07-13 NOTE — Progress Notes (Signed)
Nutrition Brief Note  Patient identified on the Malnutrition Screening Tool (MST) Report. Patient with ~9% weight loss within the past 6 months, not significant for the time frame. Nutrition focused physical exam completed.  No muscle or subcutaneous fat depletion noticed. Patient states he lost a lot of belly fat. Currently hungry. Diet being advanced for lunch today.   Wt Readings from Last 15 Encounters:  07/13/15 192 lb 0.3 oz (87.1 kg)  02/16/14 211 lb 13.8 oz (96.1 kg)  09/07/13 205 lb (92.987 kg)  11/20/12 231 lb (104.781 kg)  04/11/12 220 lb (99.791 kg)  06/29/10 246 lb (111.585 kg)  05/25/10 242 lb 12 oz (110.111 kg)    Body mass index is 28.44 kg/(m^2). Patient meets criteria for overweight based on current BMI.   Current diet order is heart healthy. Patient is hungry, expect intake will be good. Labs and medications reviewed.   No nutrition interventions warranted at this time. If nutrition issues arise, please consult RD.   Molli Barrows, RD, LDN, [place] Pager [phone] After Hours Pager [phone]

## 2015-07-13 NOTE — Progress Notes (Signed)
Patient's wife called and updated. Patient transported to 3W with RN and NT. 3W RN at bedside upon arrival.

## 2015-07-13 NOTE — Progress Notes (Signed)
Attempted report x1. Number given to receiving nurse.

## 2015-07-13 NOTE — Progress Notes (Signed)
PULMONARY / CRITICAL CARE MEDICINE   Name: [name] [name]. [place] MRN: [id number] DOB: November 07, 1945    ADMISSION DATE:  07/12/2015 CONSULTATION DATE:  07/12/2015  REFERRING MD :  ER  CHIEF COMPLAINT:  Altered Mental status  INITIAL PRESENTATION: 69 yo male brought from home with altered mental status, incontinence, hypoxia, hypotension from sepsis and PNA.  STUDIES:  CT Chest 07/12/2015 >> Bilateral multilobar pneumonia CT ABD 07/12/2015 >> Hepatic steatosis  SIGNIFICANT EVENTS: 10/2 Admitted to ICU  SUBJECTIVE:  More alert this morning. Complaining of dry mouth. Wants something to eat. No chest pain.   VITAL SIGNS: Temp:  [97.9 F (36.6 C)-101.8 F (38.8 C)] 98.3 F (36.8 C) (10/03 0835) Pulse Rate:  [88-118] 92 (10/03 0500) Resp:  [20-43] 24 (10/03 0500) BP: (75-114)/(45-67) 103/57 mmHg (10/03 0500) SpO2:  [85 %-98 %] 96 % (10/03 0500) Weight:  [190 lb (86.183 kg)-192 lb 0.3 oz (87.1 kg)] 192 lb 0.3 oz (87.1 kg) (10/03 0500) HEMODYNAMICS:   VENTILATOR SETTINGS:   INTAKE / OUTPUT:  Intake/Output Summary (Last 24 hours) at 07/13/15 0848 Last data filed at 07/13/15 0600  Gross per 24 hour  Intake   5200 ml  Output   1625 ml  Net   3575 ml    PHYSICAL EXAMINATION: General:  Ill appearing man Neuro:  Alert and oriented x3. Moves all extremities spontaneously HEENT:  PERRL, dry oral mucosa Cardiovascular:  RRR, no murmurs appreciated Lungs:  NWOB, Bibasilar crackes, no wheeze Abdomen:  S, NT, ND Musculoskeletal:  No edema Skin:  No rashes  LABS:  CBC  Recent Labs Lab 07/12/15 1643 07/12/15 1701 07/13/15 0519  WBC  --  23.8* 15.0*  HGB 17.0 15.0 12.1*  HCT 50.0 46.3 38.1*  PLT  --  184 155   Coag's  Recent Labs Lab 07/12/15 1614  INR 1.16   BMET  Recent Labs Lab 07/12/15 1614 07/12/15 1643 07/13/15 0519  NA 139 137 140  K 4.2 4.1 4.1  CL 103 100* 108  CO2 26  --  27  BUN 22* 31* 19  CREATININE 1.63* 1.40* 0.87  GLUCOSE 124* 122* 107*    Electrolytes  Recent Labs Lab 07/12/15 1614 07/13/15 0519  CALCIUM 9.3 8.0*   Sepsis Markers  Recent Labs Lab 07/12/15 1643 07/12/15 1725 07/12/15 2012 07/13/15 0519  LATICACIDVEN 3.84* 2.8* 3.2*  --   PROCALCITON  --  21.78  --  18.14   ABG  Recent Labs Lab 07/12/15 1737  PHART 7.352  PCO2ART 41.1  PO2ART 63.0*   Liver Enzymes  Recent Labs Lab 07/12/15 1614 07/13/15 0519  AST 36 28  ALT 21 18  ALKPHOS 65 50  BILITOT <0.3* 1.4*  ALBUMIN 3.5 2.5*   Cardiac Enzymes  Recent Labs Lab 07/12/15 1614  TROPONINI <0.03   Glucose  Recent Labs Lab 07/12/15 1836  GLUCAP 99    Imaging Ct Chest W Contrast  07/12/2015   CLINICAL DATA:  Sepsis, back pain, hemoptysis  EXAM: CT CHEST, ABDOMEN, AND PELVIS WITH CONTRAST  TECHNIQUE: Multidetector CT imaging of the chest, abdomen and pelvis was performed following the standard protocol during bolus administration of intravenous contrast.  CONTRAST:  133mL OMNIPAQUE IOHEXOL 300 MG/ML  SOLN  COMPARISON:  None.  FINDINGS: CT CHEST FINDINGS  Mediastinum/Nodes: No lymphadenopathy. Normal heart size. No mediastinal hematoma. Normal esophagus. Normal caliber thoracic aorta. Mild coronary artery atherosclerosis in the LAD and RCA.  Lungs/Pleura: No significant pleural effusion. Dense right lower lobe consolidation. Patchy areas of  airspace disease in the right upper lobe, left upper lobe and left lower lobe. No pneumothorax.  Musculoskeletal: No acute osseous abnormality. No aggressive lytic or sclerotic osseous lesion.  CT ABDOMEN PELVIS FINDINGS  Hepatobiliary: Low attenuation of the liver as can be seen with hepatic steatosis. No focal hepatic mass. No intrahepatic or extrahepatic biliary ductal dilatation. Cholelithiasis.  Pancreas: Atrophic pancreas.  No focal pancreatic mass.  Spleen: Normal.  Adrenals/Urinary Tract: Normal adrenal glands. Normal kidneys. No urolithiasis or obstructive uropathy. Mild nonspecific bilateral  perinephric stranding. Normal partially decompressed bladder.  Stomach/Bowel: No bowel dilatation or bowel wall thickening. No pneumatosis, pneumoperitoneum or portal venous gas. No abdominal or pelvic free fluid. Fact containing right inguinal hernia.  Vascular/Lymphatic: Normal caliber abdominal aorta with atherosclerosis. No abdominal or pelvic lymphadenopathy.  Other: No fluid collection or hematoma.  Musculoskeletal: No aggressive lytic or sclerotic osseous lesion. No acute osseous abnormality. 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  IMPRESSION: 1. Bilateral multilobar pneumonia, most severe in the right lower lobe. 2. No acute abdominal or pelvic pathology. 3. Hepatic steatosis.   Electronically Signed   By: Kathreen Devoid   On: 07/12/2015 18:35   Ct Abdomen Pelvis W Contrast  07/12/2015   CLINICAL DATA:  Sepsis, back pain, hemoptysis  EXAM: CT CHEST, ABDOMEN, AND PELVIS WITH CONTRAST  TECHNIQUE: Multidetector CT imaging of the chest, abdomen and pelvis was performed following the standard protocol during bolus administration of intravenous contrast.  CONTRAST:  143mL OMNIPAQUE IOHEXOL 300 MG/ML  SOLN  COMPARISON:  None.  FINDINGS: CT CHEST FINDINGS  Mediastinum/Nodes: No lymphadenopathy. Normal heart size. No mediastinal hematoma. Normal esophagus. Normal caliber thoracic aorta. Mild coronary artery atherosclerosis in the LAD and RCA.  Lungs/Pleura: No significant pleural effusion. Dense right lower lobe consolidation. Patchy areas of airspace disease in the right upper lobe, left upper lobe and left lower lobe. No pneumothorax.  Musculoskeletal: No acute osseous abnormality. No aggressive lytic or sclerotic osseous lesion.  CT ABDOMEN PELVIS FINDINGS  Hepatobiliary: Low attenuation of the liver as can be seen with hepatic steatosis. No focal hepatic mass. No intrahepatic or extrahepatic biliary ductal dilatation.  Cholelithiasis.  Pancreas: Atrophic pancreas.  No focal pancreatic mass.  Spleen: Normal.  Adrenals/Urinary Tract: Normal adrenal glands. Normal kidneys. No urolithiasis or obstructive uropathy. Mild nonspecific bilateral perinephric stranding. Normal partially decompressed bladder.  Stomach/Bowel: No bowel dilatation or bowel wall thickening. No pneumatosis, pneumoperitoneum or portal venous gas. No abdominal or pelvic free fluid. Fact containing right inguinal hernia.  Vascular/Lymphatic: Normal caliber abdominal aorta with atherosclerosis. No abdominal or pelvic lymphadenopathy.  Other: No fluid collection or hematoma.  Musculoskeletal: No aggressive lytic or sclerotic osseous lesion. No acute osseous abnormality. 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  IMPRESSION: 1. Bilateral multilobar pneumonia, most severe in the right lower lobe. 2. No acute abdominal or pelvic pathology. 3. Hepatic steatosis.   Electronically Signed   By: Kathreen Devoid   On: 07/12/2015 18:35   Dg Chest Port 1 View  07/13/2015   CLINICAL DATA:  Pneumonia.  EXAM: PORTABLE CHEST 1 VIEW  COMPARISON:  CT 07/12/2015.  Chest x-ray 07/12/2015 .  FINDINGS: Persistent multifocal pulmonary infiltrates consistent with pneumonia. Stable cardiomegaly. No pleural effusion or pneumothorax. Air-filled loop of bowel noted in the right upper quadrant. Stable elevation right hemidiaphragm. No acute osseous abnormality.  IMPRESSION: Persistent multifocal pneumonia.  No  interim change.   Electronically Signed   By: Marcello Moores  Register   On: 07/13/2015 07:20   Dg Chest Portable 1 View  07/12/2015   CLINICAL DATA:  Shortness of breath and lethargy.  EXAM: PORTABLE CHEST 1 VIEW  COMPARISON:  06/01/2015 and prior chest radiographs  FINDINGS: The is a very low volume film.  Bibasilar atelectasis is noted.  Elevation of the right hemidiaphragm is again noted.  There is no  evidence of pneumothorax.  No acute bony abnormalities are identified.  IMPRESSION: Very low lung volumes with bibasilar atelectasis.   Electronically Signed   By: Margarette Canada M.D.   On: 07/12/2015 16:58     ASSESSMENT / PLAN:  PULMONARY A: Acute respiratory failure likely from pneumonia Chronic Rt hemidiaphragm elevation P:   Oxygen to keep SpO2 > 92% Abx per ID section  CARDIOVASCULAR A:  Sepsis likely from PNA Hx of HTN P:  Continue IVF Hold home benazepril  RENAL A:   AKI >> baseline creatinine 0.99 02/17/14, improving P:   Monitor renal function  GASTROINTESTINAL A:   Nutrition Hx of PUD P:   NPO until mental status improved Protonix  HEMATOLOGIC A:   No acute issues P:  F/u CBC Lovenox for DVT prevention  INFECTIOUS A:   Sepsis liklely from PNA P:   Day 1 of Abx, rocephin, zithromax  Blood 10/02 >> NGTD Urine 10/02 >> NGTD Urine legionella 10/02 >> Urine pneumococcal 10/02 >> Negative Influenza 10/02 >> Negative  ENDOCRINE A:   No acute issues   P:   Monitor blood sugar on BMET  NEUROLOGIC A:   Acute encephalopathy secondary to sepsis P:   Monitor mental status Continue home xanax prn, celexa, flexeril prn, oxycodone prn  FAMILY  - Inter-disciplinary family meet or Palliative Care meeting due by:  07/19/2015  Today's Summary: Much improved. Suitable for transfer to step-down unit.   Algis Greenhouse. Jerline Pain, [place] Resident PGY-2 07/13/2015 8:59 AM   Reviewed.  Please see my note for details.  Chesley Mires, MD [facility] Pulmonary/Critical Care 07/13/2015, 2:25 PM Pager:  [phone] After 3pm call: [phone]

## 2015-07-13 NOTE — ED Provider Notes (Signed)
CSN: [id number]     Arrival date & time 07/12/15  1532 History   First MD Initiated Contact with Patient 07/12/15 1558     Chief Complaint  Patient presents with  . Back Pain  . Hemoptysis    Level V caveat due to altered mental status (Consider location/radiation/quality/duration/timing/severity/associated sxs/prior Treatment) Patient is a 69 y.o. male presenting with back pain. The history is provided by the patient.  Back Pain  patient was brought in for altered mental status. Reportedly has been complaining of some shortness of breath. For me patient complains of some back pain. Family member states that he had coughed up some blood. Had been feeling bad at home. Patient is somewhat confused and able to give much history.. Difficult to understand. Found hypoxic tachycardic and hypotensive.  Past Medical History  Diagnosis Date  . Abscess of right hip 04/11/2012  . Nephrolithiasis   . Medial epicondylitis   . Gastric ulcer   . Hypertension   . Skin cancer     20 removed  . Avascular necrosis of right femoral head ([place]) 11/26/2012   Past Surgical History  Procedure Laterality Date  . I&d extremity  04/14/2012    Procedure: IRRIGATION AND DEBRIDEMENT EXTREMITY;  Surgeon: Johnny Bridge, MD;  Location: [place];  Service: Orthopedics;  Laterality: Right;  I & D Hip  . Total hip arthroplasty Right 11/26/2012    Procedure: TOTAL HIP ARTHROPLASTY;  Surgeon: Johnny Bridge, MD;  Location: [place];  Service: Orthopedics;  Laterality: Right;   Family History  Problem Relation Age of Onset  . Heart disease Father    Social History  Substance Use Topics  . Smoking status: Never Smoker   . Smokeless tobacco: None  . Alcohol Use: No     Comment: stopped 5 year ago    Review of Systems  Unable to perform ROS Musculoskeletal: Positive for back pain.      Allergies  Review of patient's allergies indicates no known allergies.  Home Medications   Prior to Admission medications    Medication Sig Start Date End Date Taking? Authorizing Provider  ALPRAZolam Duanne Moron) 1 MG tablet Take 3 mg by mouth at bedtime.     Historical Provider, MD  benazepril (LOTENSIN) 20 MG tablet Take 20 mg by mouth daily.     Historical Provider, MD  citalopram (CELEXA) 20 MG tablet Take 20 mg by mouth daily. 02/10/14   Historical Provider, MD  cyanocobalamin (V-R VITAMIN B-12) 500 MCG tablet Take 2 tablets (1,000 mcg total) by mouth daily. 02/17/14   Barton Dubois, MD  cyclobenzaprine (FLEXERIL) 10 MG tablet Take 10 mg by mouth 3 (three) times daily as needed for muscle spasms.    Historical Provider, MD  fluticasone (FLONASE) 50 MCG/ACT nasal spray Place 2 sprays into both nostrils daily. 02/17/14   Barton Dubois, MD  loratadine (CLARITIN) 10 MG tablet Take 1 tablet (10 mg total) by mouth daily. 02/17/14   Barton Dubois, MD  Multiple Vitamin (MULTIVITAMIN WITH MINERALS) TABS tablet Take 1 tablet by mouth daily. 02/17/14   Barton Dubois, MD  oxycodone (ROXICODONE) 30 MG immediate release tablet Take 1 tablet (30 mg total) by mouth every 6 (six) hours as needed for pain. 02/17/14   Barton Dubois, MD  PROAIR HFA 108 (90 BASE) MCG/ACT inhaler Inhale 2 puffs into the lungs daily. 01/28/14   Historical Provider, MD  Vitamin D, Ergocalciferol, (DRISDOL) 50000 UNITS CAPS capsule Take 50,000 Units by mouth 3 (three) times a  week.    Historical Provider, MD   BP 88/47 mmHg  Pulse 95  Temp(Src) 99.8 F (37.7 C) (Oral)  Resp 28  Ht 5' 8.9" (1.75 m)  Wt 190 lb (86.183 kg)  BMI 28.14 kg/m2  SpO2 94% Physical Exam  Constitutional: He appears well-developed.  HENT:  Head: Atraumatic.  Eyes: EOM are normal.  Neck: Neck supple.  Cardiovascular:  Tachycardia.   Pulmonary/Chest:  Diffuse harsh breath sounds. Hypoxia  Abdominal: There is tenderness.  Moderate diffuse tenderness.  Musculoskeletal: He exhibits no edema.  Neurological:  Patient is very difficult to understand. Will follow commands. Somewhat  confused.  Skin: Skin is warm.  Psychiatric: He has a normal mood and affect.    ED Course  Procedures (including critical care time) Labs Review Labs Reviewed  COMPREHENSIVE METABOLIC PANEL - Abnormal; Notable for the following:    Glucose, Bld 124 (*)    BUN 22 (*)    Creatinine, Ser 1.63 (*)    Total Protein 6.2 (*)    Total Bilirubin <0.3 (*)    GFR calc non Af Amer 41 (*)    GFR calc Af Amer 48 (*)    All other components within normal limits  LIPASE, BLOOD - Abnormal; Notable for the following:    Lipase 13 (*)    All other components within normal limits  CBC WITH DIFFERENTIAL/PLATELET - Abnormal; Notable for the following:    WBC 23.8 (*)    Neutro Abs 21.1 (*)    Monocytes Absolute 1.6 (*)    All other components within normal limits  URINALYSIS, ROUTINE W REFLEX MICROSCOPIC (NOT AT [facility]) - Abnormal; Notable for the following:    Color, Urine AMBER (*)    Bilirubin Urine SMALL (*)    Ketones, ur 15 (*)    All other components within normal limits  LACTIC ACID, PLASMA - Abnormal; Notable for the following:    Lactic Acid, Venous 2.8 (*)    All other components within normal limits  LACTIC ACID, PLASMA - Abnormal; Notable for the following:    Lactic Acid, Venous 3.2 (*)    All other components within normal limits  I-STAT CG4 LACTIC ACID, ED - Abnormal; Notable for the following:    Lactic Acid, Venous 3.84 (*)    All other components within normal limits  I-STAT CHEM 8, ED - Abnormal; Notable for the following:    Chloride 100 (*)    BUN 31 (*)    Creatinine, Ser 1.40 (*)    Glucose, Bld 122 (*)    Calcium, Ion 1.10 (*)    All other components within normal limits  I-STAT ARTERIAL BLOOD GAS, ED - Abnormal; Notable for the following:    pO2, Arterial 63.0 (*)    Acid-base deficit 3.0 (*)    All other components within normal limits  MRSA PCR SCREENING  URINE CULTURE  CULTURE, BLOOD (ROUTINE X 2)  CULTURE, BLOOD (ROUTINE X 2)  PROTIME-INR  TROPONIN I   ETHANOL  STREP PNEUMONIAE URINARY ANTIGEN  INFLUENZA PANEL BY PCR (TYPE A & B, H1N1)(NOT AT [facility])  PROCALCITONIN  GLUCOSE, CAPILLARY  LEGIONELLA PNEUMOPHILA SEROGP 1 UR AG  BLOOD GAS, ARTERIAL  COMPREHENSIVE METABOLIC PANEL  CBC  PROCALCITONIN    Imaging Review Ct Chest W Contrast  07/12/2015   CLINICAL DATA:  Sepsis, back pain, hemoptysis  EXAM: CT CHEST, ABDOMEN, AND PELVIS WITH CONTRAST  TECHNIQUE: Multidetector CT imaging of the chest, abdomen and pelvis was performed following the  standard protocol during bolus administration of intravenous contrast.  CONTRAST:  141mL OMNIPAQUE IOHEXOL 300 MG/ML  SOLN  COMPARISON:  None.  FINDINGS: CT CHEST FINDINGS  Mediastinum/Nodes: No lymphadenopathy. Normal heart size. No mediastinal hematoma. Normal esophagus. Normal caliber thoracic aorta. Mild coronary artery atherosclerosis in the LAD and RCA.  Lungs/Pleura: No significant pleural effusion. Dense right lower lobe consolidation. Patchy areas of airspace disease in the right upper lobe, left upper lobe and left lower lobe. No pneumothorax.  Musculoskeletal: No acute osseous abnormality. No aggressive lytic or sclerotic osseous lesion.  CT ABDOMEN PELVIS FINDINGS  Hepatobiliary: Low attenuation of the liver as can be seen with hepatic steatosis. No focal hepatic mass. No intrahepatic or extrahepatic biliary ductal dilatation. Cholelithiasis.  Pancreas: Atrophic pancreas.  No focal pancreatic mass.  Spleen: Normal.  Adrenals/Urinary Tract: Normal adrenal glands. Normal kidneys. No urolithiasis or obstructive uropathy. Mild nonspecific bilateral perinephric stranding. Normal partially decompressed bladder.  Stomach/Bowel: No bowel dilatation or bowel wall thickening. No pneumatosis, pneumoperitoneum or portal venous gas. No abdominal or pelvic free fluid. Fact containing right inguinal hernia.  Vascular/Lymphatic: Normal caliber abdominal aorta with atherosclerosis. No abdominal or pelvic lymphadenopathy.   Other: No fluid collection or hematoma.  Musculoskeletal: No aggressive lytic or sclerotic osseous lesion. No acute osseous abnormality. 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  IMPRESSION: 1. Bilateral multilobar pneumonia, most severe in the right lower lobe. 2. No acute abdominal or pelvic pathology. 3. Hepatic steatosis.   Electronically Signed   By: Kathreen Devoid   On: 07/12/2015 18:35   Ct Abdomen Pelvis W Contrast  07/12/2015   CLINICAL DATA:  Sepsis, back pain, hemoptysis  EXAM: CT CHEST, ABDOMEN, AND PELVIS WITH CONTRAST  TECHNIQUE: Multidetector CT imaging of the chest, abdomen and pelvis was performed following the standard protocol during bolus administration of intravenous contrast.  CONTRAST:  116mL OMNIPAQUE IOHEXOL 300 MG/ML  SOLN  COMPARISON:  None.  FINDINGS: CT CHEST FINDINGS  Mediastinum/Nodes: No lymphadenopathy. Normal heart size. No mediastinal hematoma. Normal esophagus. Normal caliber thoracic aorta. Mild coronary artery atherosclerosis in the LAD and RCA.  Lungs/Pleura: No significant pleural effusion. Dense right lower lobe consolidation. Patchy areas of airspace disease in the right upper lobe, left upper lobe and left lower lobe. No pneumothorax.  Musculoskeletal: No acute osseous abnormality. No aggressive lytic or sclerotic osseous lesion.  CT ABDOMEN PELVIS FINDINGS  Hepatobiliary: Low attenuation of the liver as can be seen with hepatic steatosis. No focal hepatic mass. No intrahepatic or extrahepatic biliary ductal dilatation. Cholelithiasis.  Pancreas: Atrophic pancreas.  No focal pancreatic mass.  Spleen: Normal.  Adrenals/Urinary Tract: Normal adrenal glands. Normal kidneys. No urolithiasis or obstructive uropathy. Mild nonspecific bilateral perinephric stranding. Normal partially decompressed bladder.  Stomach/Bowel: No bowel dilatation or bowel wall thickening. No pneumatosis,  pneumoperitoneum or portal venous gas. No abdominal or pelvic free fluid. Fact containing right inguinal hernia.  Vascular/Lymphatic: Normal caliber abdominal aorta with atherosclerosis. No abdominal or pelvic lymphadenopathy.  Other: No fluid collection or hematoma.  Musculoskeletal: No aggressive lytic or sclerotic osseous lesion. No acute osseous abnormality. 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  IMPRESSION: 1. Bilateral multilobar pneumonia, most severe in the right lower lobe. 2. No acute abdominal or pelvic pathology. 3. Hepatic steatosis.   Electronically Signed   By: Kathreen Devoid   On: 07/12/2015 18:35   Dg Chest Portable 1  View  07/12/2015   CLINICAL DATA:  Shortness of breath and lethargy.  EXAM: PORTABLE CHEST 1 VIEW  COMPARISON:  06/01/2015 and prior chest radiographs  FINDINGS: The is a very low volume film.  Bibasilar atelectasis is noted.  Elevation of the right hemidiaphragm is again noted.  There is no evidence of pneumothorax.  No acute bony abnormalities are identified.  IMPRESSION: Very low lung volumes with bibasilar atelectasis.   Electronically Signed   By: Margarette Canada M.D.   On: 07/12/2015 16:58   I have personally reviewed and evaluated these images and lab results as part of my medical decision-making.   EKG Interpretation   Date/Time:  Sunday July 12 2015 16:11:17 EDT Ventricular Rate:  110 PR Interval:  122 QRS Duration: 129 QT Interval:  337 QTC Calculation: 456 R Axis:   -3 Text Interpretation:  Sinus tachycardia Multiple premature complexes, vent   Right bundle branch block Inferior infarct, old Confirmed by Ambert Virrueta   MD, Lummie Montijo [phone]) on 07/12/2015 4:29:04 PM      MDM   Final diagnoses:  Sepsis, due to unspecified organism [facility])    Patient with altered mental status and hypoxia. Had her poorly had hemoptysis. Found to be febrile. Initial x-ray does not show clear  pneumonia but does have very harsh breath sounds. Tenderness on my exam on his abdomen. Will admit to ICU.  CRITICAL CARE Performed by: Mackie Pai Total critical care time: 30 Critical care time was exclusive of separately billable procedures and treating other patients. Critical care was necessary to treat or prevent imminent or life-threatening deterioration. Critical care was time spent personally by me on the following activities: development of treatment plan with patient and/or surrogate as well as nursing, discussions with consultants, evaluation of patient's response to treatment, examination of patient, obtaining history from patient or surrogate, ordering and performing treatments and interventions, ordering and review of laboratory studies, ordering and review of radiographic studies, pulse oximetry and re-evaluation of patient's condition.      Davonna Belling, MD 07/13/15 [phone]

## 2015-07-13 NOTE — Progress Notes (Signed)
Utilization review completed. Jove Beyl, RN, BSN. 

## 2015-07-14 ENCOUNTER — Encounter (HOSPITAL_COMMUNITY): Payer: Self-pay | Admitting: *Deleted

## 2015-07-14 DIAGNOSIS — R531 Weakness: Secondary | ICD-10-CM

## 2015-07-14 DIAGNOSIS — A419 Sepsis, unspecified organism: Secondary | ICD-10-CM | POA: Diagnosis present

## 2015-07-14 DIAGNOSIS — I10 Essential (primary) hypertension: Secondary | ICD-10-CM | POA: Diagnosis present

## 2015-07-14 LAB — BASIC METABOLIC PANEL
Anion gap: 5 (ref 5–15)
BUN: 17 mg/dL (ref 6–20)
CALCIUM: 8.2 mg/dL — AB (ref 8.9–10.3)
CO2: 28 mmol/L (ref 22–32)
CREATININE: 0.98 mg/dL (ref 0.61–1.24)
Chloride: 104 mmol/L (ref 101–111)
Glucose, Bld: 101 mg/dL — ABNORMAL HIGH (ref 65–99)
Potassium: 3.7 mmol/L (ref 3.5–5.1)
SODIUM: 137 mmol/L (ref 135–145)

## 2015-07-14 LAB — CBC
HCT: 33.6 % — ABNORMAL LOW (ref 39.0–52.0)
HEMOGLOBIN: 10.8 g/dL — AB (ref 13.0–17.0)
MCH: 31.9 pg (ref 26.0–34.0)
MCHC: 32.1 g/dL (ref 30.0–36.0)
MCV: 99.1 fL (ref 78.0–100.0)
PLATELETS: 136 10*3/uL — AB (ref 150–400)
RBC: 3.39 MIL/uL — AB (ref 4.22–5.81)
RDW: 13.7 % (ref 11.5–15.5)
WBC: 7.4 10*3/uL (ref 4.0–10.5)

## 2015-07-14 LAB — PROCALCITONIN: PROCALCITONIN: 9.33 ng/mL

## 2015-07-14 LAB — LEGIONELLA PNEUMOPHILA SEROGP 1 UR AG: L. PNEUMOPHILA SEROGP 1 UR AG: NEGATIVE

## 2015-07-14 MED ORDER — DIAZEPAM 2 MG PO TABS
2.0000 mg | ORAL_TABLET | Freq: Every day | ORAL | Status: DC | PRN
  Administered 2015-07-14: 2 mg via ORAL
  Filled 2015-07-14: qty 1

## 2015-07-14 MED ORDER — ALPRAZOLAM 0.5 MG PO TABS: 0.5000 mg | ORAL_TABLET | Freq: Every day | ORAL | Status: DC

## 2015-07-14 NOTE — Progress Notes (Signed)
[facility] PROGRESS NOTE  [name] [name]. Jimmye Norman YSA:[id number] DOB: [birth year] DOA: 07/12/2015 PCP: Octavio Graves, DO  Summary 69 yo male admitted to PCCM with sepsis, multifocal CAP, encephalopathy.  Improved and transferred to [facility]/SDU 10/4  Assessment/Plan:  Principal Problem:   CAP (community acquired pneumonia): urine legionella neg. Improving. Wean oxygen. Transfer to floor. PT eval. Continue rocephin, azithromycin Active Problems:   Sepsis ([place])   Weakness   Essential hypertension: meds held   Code Status:  full Family Communication:  wife Disposition Plan:  Home 1-2 days if stable, pending PT eval  Antibiotics:  Rocephin azithromycin  HPI/Subjective: Feeling better. No dyspnea. Weak. Some nausea. tol diet  Objective: Filed Vitals:   07/14/15 1651  BP:   Pulse:   Temp: 98.6 F (37 C)  Resp:     Intake/Output Summary (Last 24 hours) at 07/14/15 2005 Last data filed at 07/14/15 1700  Gross per 24 hour  Intake    490 ml  Output    900 ml  Net   -410 ml   Filed Weights   07/12/15 1700 07/13/15 0500 07/14/15 0500  Weight: 86.183 kg (190 lb) 87.1 kg (192 lb 0.3 oz) 95.528 kg (210 lb 9.6 oz)    Exam:   General:  A and o. Talkative. Breathing nonlabored on [place]  Cardiovascular: RRR without MGR  Respiratory: rales right base. No wheeze or rhonchi  Abdomen: S, NT, ND  Ext: no CCE  Basic Metabolic Panel:  Recent Labs Lab 07/12/15 1614 07/12/15 1643 07/13/15 0519 07/14/15 0538  NA 139 137 140 137  K 4.2 4.1 4.1 3.7  CL 103 100* 108 104  CO2 26  --  27 28  GLUCOSE 124* 122* 107* 101*  BUN 22* 31* 19 17  CREATININE 1.63* 1.40* 0.87 0.98  CALCIUM 9.3  --  8.0* 8.2*   Liver Function Tests:  Recent Labs Lab 07/12/15 1614 07/13/15 0519  AST 36 28  ALT 21 18  ALKPHOS 65 50  BILITOT <0.3* 1.4*  PROT 6.2* 4.7*  ALBUMIN 3.5 2.5*    Recent Labs Lab 07/12/15 1614  LIPASE 13*   No results for input(s): AMMONIA in the last 168  hours. CBC:  Recent Labs Lab 07/12/15 1643 07/12/15 1701 07/13/15 0519 07/14/15 0538  WBC  --  23.8* 15.0* 7.4  NEUTROABS  --  21.1*  --   --   HGB 17.0 15.0 12.1* 10.8*  HCT 50.0 46.3 38.1* 33.6*  MCV  --  98.9 98.2 99.1  PLT  --  184 155 136*   Cardiac Enzymes:  Recent Labs Lab 07/12/15 1614  TROPONINI <0.03   BNP (last 3 results) No results for input(s): BNP in the last 8760 hours.  ProBNP (last 3 results) No results for input(s): PROBNP in the last 8760 hours.  CBG:  Recent Labs Lab 07/12/15 1836  GLUCAP 99    Recent Results (from the past 240 hour(s))  Urine culture     Status: None   Collection Time: 07/12/15  4:45 PM  Result Value Ref Range Status   Specimen Description URINE, RANDOM  Final   Special Requests NONE  Final   Culture NO GROWTH 1 DAY  Final   Report Status 07/13/2015 FINAL  Final  Culture, blood (routine x 2) Call MD if unable to obtain prior to antibiotics being given     Status: None (Preliminary result)   Collection Time: 07/12/15  5:25 PM  Result Value Ref Range Status   Specimen  Description BLOOD LEFT HAND  Final   Special Requests BOTTLES DRAWN AEROBIC AND ANAEROBIC 5CC  Final   Culture NO GROWTH 2 DAYS  Final   Report Status PENDING  Incomplete  Culture, blood (routine x 2) Call MD if unable to obtain prior to antibiotics being given     Status: None (Preliminary result)   Collection Time: 07/12/15  5:30 PM  Result Value Ref Range Status   Specimen Description BLOOD RIGHT ARM  Final   Special Requests   Final    BOTTLES DRAWN AEROBIC AND ANAEROBIC RED 5CC BLUE 10CC   Culture NO GROWTH 2 DAYS  Final   Report Status PENDING  Incomplete  MRSA PCR Screening     Status: None   Collection Time: 07/12/15  6:36 PM  Result Value Ref Range Status   MRSA by PCR NEGATIVE NEGATIVE Final    Comment:        The GeneXpert MRSA Assay (FDA approved for NASAL specimens only), is one component of a comprehensive MRSA  colonization surveillance program. It is not intended to diagnose MRSA infection nor to guide or monitor treatment for MRSA infections.      Studies: Dg Chest Port 1 View  07/13/2015   CLINICAL DATA:  Pneumonia.  EXAM: PORTABLE CHEST 1 VIEW  COMPARISON:  CT 07/12/2015.  Chest x-ray 07/12/2015 .  FINDINGS: Persistent multifocal pulmonary infiltrates consistent with pneumonia. Stable cardiomegaly. No pleural effusion or pneumothorax. Air-filled loop of bowel noted in the right upper quadrant. Stable elevation right hemidiaphragm. No acute osseous abnormality.  IMPRESSION: Persistent multifocal pneumonia.  No interim change.   Electronically Signed   By: Marcello Moores  Register   On: 07/13/2015 07:20    Scheduled Meds: . ALPRAZolam  0.5 mg Oral QHS  . antiseptic oral rinse  7 mL Mouth Rinse BID  . azithromycin  500 mg Intravenous Q24H  . benazepril  20 mg Oral Daily  . cefTRIAXone (ROCEPHIN)  IV  1 g Intravenous Q24H  . chlorhexidine  15 mL Mouth Rinse BID  . citalopram  20 mg Oral Daily  . enoxaparin (LOVENOX) injection  40 mg Subcutaneous Q24H  . tamsulosin  0.4 mg Oral QPM   Continuous Infusions: . sodium chloride 10 mL/hr at 07/13/15 1659    Time spent: 25 minutes  [facility]  [email or web address], password [facility] 07/14/2015, 8:05 PM  LOS: 2 days

## 2015-07-15 DIAGNOSIS — D649 Anemia, unspecified: Secondary | ICD-10-CM

## 2015-07-15 DIAGNOSIS — D72829 Elevated white blood cell count, unspecified: Secondary | ICD-10-CM | POA: Diagnosis present

## 2015-07-15 DIAGNOSIS — I10 Essential (primary) hypertension: Secondary | ICD-10-CM

## 2015-07-15 MED ORDER — LEVOFLOXACIN 500 MG PO TABS: 500.0000 mg | ORAL_TABLET | Freq: Every day | ORAL | Status: DC

## 2015-07-15 NOTE — Progress Notes (Signed)
Patient being discharged home per MD order. All discharge instructions given to patient.

## 2015-07-15 NOTE — Discharge Instructions (Signed)
Follow with BUTLER, CYNTHIA, DO in 5-7 days  Please get a complete blood count and chemistry panel checked by your Primary MD at your next visit, and again as instructed by your Primary MD. Please get your medications reviewed and adjusted by your Primary MD.  Please request your Primary MD to go over all Hospital Tests and Procedure/Radiological results at the follow up, please get all Hospital records sent to your Prim MD by signing hospital release before you go home.  If you had Pneumonia of Lung problems at the Hospital: Please get a 2 view Chest X ray done in 6-8 weeks after hospital discharge or sooner if instructed by your Primary MD.  If you have Congestive Heart Failure: Please call your Cardiologist or Primary MD anytime you have any of the following symptoms:  1) 3 pound weight gain in 24 hours or 5 pounds in 1 week  2) shortness of breath, with or without a dry hacking cough  3) swelling in the hands, feet or stomach  4) if you have to sleep on extra pillows at night in order to breathe  Follow cardiac low salt diet and 1.5 lit/day fluid restriction.  If you have diabetes Accuchecks 4 times/day, Once in AM empty stomach and then before each meal. Log in all results and show them to your primary doctor at your next visit. If any glucose reading is under 80 or above 300 call your primary MD immediately.  If you have Seizure/Convulsions/Epilepsy: Please do not drive, operate heavy machinery, participate in activities at heights or participate in high speed sports until you have seen by Primary MD or a Neurologist and advised to do so again.  If you had Gastrointestinal Bleeding: Please ask your Primary MD to check a complete blood count within one week of discharge or at your next visit. Your endoscopic/colonoscopic biopsies that are pending at the time of discharge, will also need to followed by your Primary MD.  Get Medicines reviewed and adjusted. Please take all your  medications with you for your next visit with your Primary MD  Please request your Primary MD to go over all hospital tests and procedure/radiological results at the follow up, please ask your Primary MD to get all Hospital records sent to his/her office.  If you experience worsening of your admission symptoms, develop shortness of breath, life threatening emergency, suicidal or homicidal thoughts you must seek medical attention immediately by calling 911 or calling your MD immediately  if symptoms less severe.  You must read complete instructions/literature along with all the possible adverse reactions/side effects for all the Medicines you take and that have been prescribed to you. Take any new Medicines after you have completely understood and accpet all the possible adverse reactions/side effects.   Do not drive or operate heavy machinery when taking Pain medications.   Do not take more than prescribed Pain, Sleep and Anxiety Medications  Special Instructions: If you have smoked or chewed Tobacco  in the last 2 yrs please stop smoking, stop any regular Alcohol  and or any Recreational drug use.  Wear Seat belts while driving.  Please note You were cared for by a hospitalist during your hospital stay. If you have any questions about your discharge medications or the care you received while you were in the hospital after you are discharged, you can call the unit and asked to speak with the hospitalist on call if the hospitalist that took care of you is not available. Once  you are discharged, your primary care physician will handle any further medical issues. Please note that NO REFILLS for any discharge medications will be authorized once you are discharged, as it is imperative that you return to your primary care physician (or establish a relationship with a primary care physician if you do not have one) for your aftercare needs so that they can reassess your need for medications and monitor your  lab values.  You can reach the hospitalist office at phone [phone] or fax [phone]   If you do not have a primary care physician, you can call [phone] for a physician referral.  Activity: As tolerated with Full fall precautions use walker/cane & assistance as needed  Diet: regular  Disposition Home

## 2015-07-15 NOTE — Discharge Summary (Signed)
Physician Discharge Summary  [name] [name]. Gilliand FIE:[id number] DOB: Mar 03, 1946 DOA: 07/12/2015  PCP: Octavio Graves, DO  Admit date: 07/12/2015 Discharge date: 07/15/2015  Time spent: > 35 minutes  Recommendations for Outpatient Follow-up:  1. Follow up with Dr. Melina Copa in 1 week   Discharge Diagnoses:  Principal Problem:   CAP (community acquired pneumonia) Active Problems:   Sepsis ([place])   Weakness   Essential hypertension   Anemia   Leukocytosis  Discharge Condition: stable  Diet recommendation: regular  Filed Weights   07/13/15 0500 07/14/15 0500 07/15/15 0452  Weight: 87.1 kg (192 lb 0.3 oz) 95.528 kg (210 lb 9.6 oz) 95.301 kg (210 lb 1.6 oz)   History of present illness:  Hx from ER staff and chart. Limit hx from pt. Family reported to ER staff that pt was getting more sleepy for last day. He was having back pain, cough, and ?coughed up some blood. He was brought to ER. Found to be hypoxic and low BP. He was started on Abx, and IV fluids. He has been feeling nauseous. He feels short of breath and thirsty. He denies abdominal pain or diarrhea. He used to drink alcohol, but denies any drinking for past two years.  Hospital Course:  Patient was admitted to the hospital with sepsis due to community acquired pneumonia and initially admitted to ICU. He was placed on Ceftriaxone and Azithromycin and following clinical improvement was transferred to floor on 10/3. He continued to improve, his hypoxia resolved and he was able to be weaned off oxygen, his antibiotics were transitioned to Levaquin and he is to complete 5 additional days as an outpatient for a total of 8 days. On the day of discharge patient was feeling back to baseline, able to ambulate in the hallway without hypoxia, dyspnea or chest pain and was stable for discharge home.   Procedures:  None    Consultations:  PCCM  Discharge Exam: Filed Vitals:   07/14/15 1651 07/14/15 2100 07/14/15 2356 07/15/15  0452  BP:  110/79 114/66 100/66  Pulse:  86 87 84  Temp: 98.6 F (37 C) 98.5 F (36.9 C) 97.8 F (36.6 C) 98 F (36.7 C)  TempSrc: Oral Oral Oral Oral  Resp:  18 20 18   Height:      Weight:    95.301 kg (210 lb 1.6 oz)  SpO2:  96% 97% 97%    General: NAD Cardiovascular: RRR Respiratory: CTA biL, no wheezing  Discharge Instructions     Medication List    TAKE these medications        ALPRAZolam 0.5 MG tablet  Commonly known as:  XANAX  Take 0.5 mg by mouth at bedtime.     benazepril 20 MG tablet  Commonly known as:  LOTENSIN  Take 20 mg by mouth daily.     diazepam 5 MG tablet  Commonly known as:  VALIUM  Take 5 mg by mouth daily as needed for anxiety.     fluticasone 50 MCG/ACT nasal spray  Commonly known as:  FLONASE  Place 2 sprays into both nostrils daily.     lactose free nutrition Liqd  Take 237 mLs by mouth daily.     levofloxacin 500 MG tablet  Commonly known as:  LEVAQUIN  Take 1 tablet (500 mg total) by mouth daily.     loratadine 10 MG tablet  Commonly known as:  CLARITIN  Take 1 tablet (10 mg total) by mouth daily.     oxycodone 30 MG  immediate release tablet  Commonly known as:  ROXICODONE  Take 1 tablet (30 mg total) by mouth every 6 (six) hours as needed for pain.     pantoprazole 40 MG tablet  Commonly known as:  PROTONIX  Take 40 mg by mouth daily.     silver sulfADIAZINE 1 % cream  Commonly known as:  SILVADENE  Apply 1 application topically daily as needed (FOR INNER THIGH).     tamsulosin 0.4 MG Caps capsule  Commonly known as:  FLOMAX  Take 0.4 mg by mouth every evening.           Follow-up Information    Follow up with BUTLER, CYNTHIA, DO. Go on 07/21/2015.   Why:  @ 11:15 am   Contact information:   [address] [place] [address] [phone]       The results of significant diagnostics from this hospitalization (including imaging, microbiology, ancillary and laboratory) are listed below for reference.     Significant Diagnostic Studies: Ct Chest W Contrast  07/12/2015   CLINICAL DATA:  Sepsis, back pain, hemoptysis  EXAM: CT CHEST, ABDOMEN, AND PELVIS WITH CONTRAST  TECHNIQUE: Multidetector CT imaging of the chest, abdomen and pelvis was performed following the standard protocol during bolus administration of intravenous contrast.  CONTRAST:  155mL OMNIPAQUE IOHEXOL 300 MG/ML  SOLN  COMPARISON:  None.  FINDINGS: CT CHEST FINDINGS  Mediastinum/Nodes: No lymphadenopathy. Normal heart size. No mediastinal hematoma. Normal esophagus. Normal caliber thoracic aorta. Mild coronary artery atherosclerosis in the LAD and RCA.  Lungs/Pleura: No significant pleural effusion. Dense right lower lobe consolidation. Patchy areas of airspace disease in the right upper lobe, left upper lobe and left lower lobe. No pneumothorax.  Musculoskeletal: No acute osseous abnormality. No aggressive lytic or sclerotic osseous lesion.  CT ABDOMEN PELVIS FINDINGS  Hepatobiliary: Low attenuation of the liver as can be seen with hepatic steatosis. No focal hepatic mass. No intrahepatic or extrahepatic biliary ductal dilatation. Cholelithiasis.  Pancreas: Atrophic pancreas.  No focal pancreatic mass.  Spleen: Normal.  Adrenals/Urinary Tract: Normal adrenal glands. Normal kidneys. No urolithiasis or obstructive uropathy. Mild nonspecific bilateral perinephric stranding. Normal partially decompressed bladder.  Stomach/Bowel: No bowel dilatation or bowel wall thickening. No pneumatosis, pneumoperitoneum or portal venous gas. No abdominal or pelvic free fluid. Fact containing right inguinal hernia.  Vascular/Lymphatic: Normal caliber abdominal aorta with atherosclerosis. No abdominal or pelvic lymphadenopathy.  Other: No fluid collection or hematoma.  Musculoskeletal: No aggressive lytic or sclerotic osseous lesion. No acute osseous abnormality. Total right hip arthroplasty with beam hardening artifact partially obscuring the adjacent soft tissue  and osseous structures.  IMPRESSION: 1. Bilateral multilobar pneumonia, most [name] in the right lower lobe. 2. No acute abdominal or pelvic pathology. 3. Hepatic steatosis.   Electronically Signed   By: Kathreen Devoid   On: 07/12/2015 18:35   Ct Abdomen Pelvis W Contrast  07/12/2015   CLINICAL DATA:  Sepsis, back pain, hemoptysis  EXAM: CT CHEST, ABDOMEN, AND PELVIS WITH CONTRAST  TECHNIQUE: Multidetector CT imaging of the chest, abdomen and pelvis was performed following the standard protocol during bolus administration of intravenous contrast.  CONTRAST:  159mL OMNIPAQUE IOHEXOL 300 MG/ML  SOLN  COMPARISON:  None.  FINDINGS: CT CHEST FINDINGS  Mediastinum/Nodes: No lymphadenopathy. Normal heart size. No mediastinal hematoma. Normal esophagus. Normal caliber thoracic aorta. Mild coronary artery atherosclerosis in the LAD and RCA.  Lungs/Pleura: No significant pleural effusion. Dense right lower lobe consolidation. Patchy areas of airspace disease in the  right upper lobe, left upper lobe and left lower lobe. No pneumothorax.  Musculoskeletal: No acute osseous abnormality. No aggressive lytic or sclerotic osseous lesion.  CT ABDOMEN PELVIS FINDINGS  Hepatobiliary: Low attenuation of the liver as can be seen with hepatic steatosis. No focal hepatic mass. No intrahepatic or extrahepatic biliary ductal dilatation. Cholelithiasis.  Pancreas: Atrophic pancreas.  No focal pancreatic mass.  Spleen: Normal.  Adrenals/Urinary Tract: Normal adrenal glands. Normal kidneys. No urolithiasis or obstructive uropathy. Mild nonspecific bilateral perinephric stranding. Normal partially decompressed bladder.  Stomach/Bowel: No bowel dilatation or bowel wall thickening. No pneumatosis, pneumoperitoneum or portal venous gas. No abdominal or pelvic free fluid. Fact containing right inguinal hernia.  Vascular/Lymphatic: Normal caliber abdominal aorta with atherosclerosis. No abdominal or pelvic lymphadenopathy.  Other: No fluid  collection or hematoma.  Musculoskeletal: No aggressive lytic or sclerotic osseous lesion. No acute osseous abnormality. 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  IMPRESSION: 1. Bilateral multilobar pneumonia, most [name] in the right lower lobe. 2. No acute abdominal or pelvic pathology. 3. Hepatic steatosis.   Electronically Signed   By: Kathreen Devoid   On: 07/12/2015 18:35   Dg Chest Port 1 View  07/13/2015   CLINICAL DATA:  Pneumonia.  EXAM: PORTABLE CHEST 1 VIEW  COMPARISON:  CT 07/12/2015.  Chest x-ray 07/12/2015 .  FINDINGS: Persistent multifocal pulmonary infiltrates consistent with pneumonia. Stable cardiomegaly. No pleural effusion or pneumothorax. Air-filled loop of bowel noted in the right upper quadrant. Stable elevation right hemidiaphragm. No acute osseous abnormality.  IMPRESSION: Persistent multifocal pneumonia.  No interim change.   Electronically Signed   By: Marcello Moores  Register   On: 07/13/2015 07:20   Dg Chest Portable 1 View  07/12/2015   CLINICAL DATA:  Shortness of breath and lethargy.  EXAM: PORTABLE CHEST 1 VIEW  COMPARISON:  06/01/2015 and prior chest radiographs  FINDINGS: The is a very low volume film.  Bibasilar atelectasis is noted.  Elevation of the right hemidiaphragm is again noted.  There is no evidence of pneumothorax.  No acute bony abnormalities are identified.  IMPRESSION: Very low lung volumes with bibasilar atelectasis.   Electronically Signed   By: Margarette Canada M.D.   On: 07/12/2015 16:58    Microbiology: Recent Results (from the past 240 hour(s))  Urine culture     Status: None   Collection Time: 07/12/15  4:45 PM  Result Value Ref Range Status   Specimen Description URINE, RANDOM  Final   Special Requests NONE  Final   Culture NO GROWTH 1 DAY  Final   Report Status 07/13/2015 FINAL  Final  Culture, blood (routine x 2) Call MD if unable to obtain prior to antibiotics being  given     Status: None (Preliminary result)   Collection Time: 07/12/15  5:25 PM  Result Value Ref Range Status   Specimen Description BLOOD LEFT HAND  Final   Special Requests BOTTLES DRAWN AEROBIC AND ANAEROBIC 5CC  Final   Culture NO GROWTH 3 DAYS  Final   Report Status PENDING  Incomplete  Culture, blood (routine x 2) Call MD if unable to obtain prior to antibiotics being given     Status: None (Preliminary result)   Collection Time: 07/12/15  5:30 PM  Result Value Ref Range Status   Specimen Description BLOOD RIGHT ARM  Final   Special Requests   Final    BOTTLES DRAWN AEROBIC AND ANAEROBIC RED 5CC BLUE 10CC  Culture NO GROWTH 3 DAYS  Final   Report Status PENDING  Incomplete  MRSA PCR Screening     Status: None   Collection Time: 07/12/15  6:36 PM  Result Value Ref Range Status   MRSA by PCR NEGATIVE NEGATIVE Final    Comment:        The GeneXpert MRSA Assay (FDA approved for NASAL specimens only), is one component of a comprehensive MRSA colonization surveillance program. It is not intended to diagnose MRSA infection nor to guide or monitor treatment for MRSA infections.      Labs: Basic Metabolic Panel:  Recent Labs Lab 07/12/15 1614 07/12/15 1643 07/13/15 0519 07/14/15 0538  NA 139 137 140 137  K 4.2 4.1 4.1 3.7  CL 103 100* 108 104  CO2 26  --  27 28  GLUCOSE 124* 122* 107* 101*  BUN 22* 31* 19 17  CREATININE 1.63* 1.40* 0.87 0.98  CALCIUM 9.3  --  8.0* 8.2*   Liver Function Tests:  Recent Labs Lab 07/12/15 1614 07/13/15 0519  AST 36 28  ALT 21 18  ALKPHOS 65 50  BILITOT <0.3* 1.4*  PROT 6.2* 4.7*  ALBUMIN 3.5 2.5*    Recent Labs Lab 07/12/15 1614  LIPASE 13*   CBC:  Recent Labs Lab 07/12/15 1643 07/12/15 1701 07/13/15 0519 07/14/15 0538  WBC  --  23.8* 15.0* 7.4  NEUTROABS  --  21.1*  --   --   HGB 17.0 15.0 12.1* 10.8*  HCT 50.0 46.3 38.1* 33.6*  MCV  --  98.9 98.2 99.1  PLT  --  184 155 136*   Cardiac Enzymes:  Recent  Labs Lab 07/12/15 1614  TROPONINI <0.03   CBG:  Recent Labs Lab 07/12/15 1836  GLUCAP 99    Signed:  GHERGHE, COSTIN  [facility] 07/15/2015, 4:02 PM

## 2015-07-17 LAB — CULTURE, BLOOD (ROUTINE X 2)
CULTURE: NO GROWTH
Culture: NO GROWTH

## 2015-10-11 DIAGNOSIS — J189 Pneumonia, unspecified organism: Secondary | ICD-10-CM

## 2015-10-11 HISTORY — DX: Pneumonia, unspecified organism: J18.9

## 2016-07-18 ENCOUNTER — Encounter (HOSPITAL_COMMUNITY): Payer: Self-pay

## 2016-07-18 ENCOUNTER — Emergency Department (HOSPITAL_COMMUNITY): Payer: Medicare Other

## 2016-07-18 ENCOUNTER — Emergency Department (HOSPITAL_COMMUNITY)
Admission: EM | Admit: 2016-07-18 | Discharge: 2016-07-18 | Disposition: A | Discharge status: Home / Self Care | Payer: Medicare Other | Attending: Physician Assistant | Admitting: Physician Assistant

## 2016-07-18 DIAGNOSIS — Z85828 Personal history of other malignant neoplasm of skin: Secondary | ICD-10-CM | POA: Insufficient documentation

## 2016-07-18 DIAGNOSIS — Z79899 Other long term (current) drug therapy: Secondary | ICD-10-CM | POA: Diagnosis not present

## 2016-07-18 DIAGNOSIS — Z96641 Presence of right artificial hip joint: Secondary | ICD-10-CM | POA: Insufficient documentation

## 2016-07-18 DIAGNOSIS — Z5181 Encounter for therapeutic drug level monitoring: Secondary | ICD-10-CM | POA: Insufficient documentation

## 2016-07-18 DIAGNOSIS — R531 Weakness: Secondary | ICD-10-CM | POA: Diagnosis present

## 2016-07-18 DIAGNOSIS — M79602 Pain in left arm: Secondary | ICD-10-CM

## 2016-07-18 DIAGNOSIS — I10 Essential (primary) hypertension: Secondary | ICD-10-CM | POA: Diagnosis not present

## 2016-07-18 DIAGNOSIS — R51 Headache: Secondary | ICD-10-CM | POA: Diagnosis not present

## 2016-07-18 DIAGNOSIS — Z8673 Personal history of transient ischemic attack (TIA), and cerebral infarction without residual deficits: Secondary | ICD-10-CM | POA: Diagnosis not present

## 2016-07-18 LAB — I-STAT TROPONIN, ED: Troponin i, poc: 0 ng/mL (ref 0.00–0.08)

## 2016-07-18 LAB — COMPREHENSIVE METABOLIC PANEL
ALBUMIN: 4.1 g/dL (ref 3.5–5.0)
ALK PHOS: 89 U/L (ref 38–126)
ALT: 14 U/L — ABNORMAL LOW (ref 17–63)
ANION GAP: 8 (ref 5–15)
AST: 21 U/L (ref 15–41)
BUN: 14 mg/dL (ref 6–20)
CALCIUM: 9.4 mg/dL (ref 8.9–10.3)
CHLORIDE: 102 mmol/L (ref 101–111)
CO2: 29 mmol/L (ref 22–32)
Creatinine, Ser: 1.06 mg/dL (ref 0.61–1.24)
GFR calc Af Amer: >60 mL/min (ref >60)
GFR calc non Af Amer: >60 mL/min (ref >60)
GLUCOSE: 117 mg/dL — AB (ref 65–99)
Potassium: 3.8 mmol/L (ref 3.5–5.1)
SODIUM: 139 mmol/L (ref 135–145)
Total Bilirubin: 0.6 mg/dL (ref 0.3–1.2)
Total Protein: 6.9 g/dL (ref 6.5–8.1)

## 2016-07-18 LAB — DIFFERENTIAL
BASOS PCT: 0 %
Basophils Absolute: 0 10*3/uL (ref 0.0–0.1)
EOS PCT: 4 %
Eosinophils Absolute: 0.2 10*3/uL (ref 0.0–0.7)
LYMPHS ABS: 1.4 10*3/uL (ref 0.7–4.0)
LYMPHS PCT: 24 %
Monocytes Absolute: 0.5 10*3/uL (ref 0.1–1.0)
Monocytes Relative: 9 %
Neutro Abs: 3.8 10*3/uL (ref 1.7–7.7)
Neutrophils Relative %: 63 %

## 2016-07-18 LAB — PROTIME-INR
INR: 1.04
PROTHROMBIN TIME: 13.7 s (ref 11.4–15.2)

## 2016-07-18 LAB — CBC
HCT: 44.5 % (ref 39.0–52.0)
HEMOGLOBIN: 14 g/dL (ref 13.0–17.0)
MCH: 30.2 pg (ref 26.0–34.0)
MCHC: 31.5 g/dL (ref 30.0–36.0)
MCV: 96.1 fL (ref 78.0–100.0)
PLATELETS: 173 10*3/uL (ref 150–400)
RBC: 4.63 MIL/uL (ref 4.22–5.81)
RDW: 12.7 % (ref 11.5–15.5)
WBC: 5.9 10*3/uL (ref 4.0–10.5)

## 2016-07-18 LAB — APTT: aPTT: 33 seconds (ref 24–36)

## 2016-07-18 NOTE — Discharge Instructions (Signed)
CT of your head today did not show any acute stroke. Neurology recommended that you have an outpatient MRI of your cervical and/or lumbar spine to assess your pain further.  Dr. Mardelle Matte can arrange these for you. May also wish to follow-up with your primary care doctor. Continue your home pain medications for your pain. Return here for new concerns.

## 2016-07-18 NOTE — ED Notes (Signed)
Neurology at bedside.

## 2016-07-18 NOTE — ED Notes (Signed)
EDP at bedside  

## 2016-07-18 NOTE — ED Provider Notes (Signed)
[facility] DEPT Provider Note   CSN: [id number] Arrival date & time: 07/18/16  1144     History   Chief Complaint Chief Complaint  Patient presents with  . acute weakness    HPI [name] [name]. [name] [name] is a 70 y.o. male.  The history is provided by the patient and medical records.    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 Patient states he went to bed last night as normal around 10-11pm, was feeling fine. States he got up to go to the bathroom at 3 AM and noticed that his left leg was weak and "not working properly". Patient states he went back to bed but still woke up again feeling weak. States he went to a general surgeon's office for evaluation of a possible hernia and was sent here due to profound weakness in his left arm and leg. States " I know what I need my leg and arm to do, they just won't do it".  He states he feels like "his nerves aren't working".  States his left hand also feels very "cold". No hx of same in the past.  He has no history of TIA or stroke. He is not currently on any type of anticoagulation. He denies any recent injury or trauma to his neck or back. No recent head injury either. He denies any headache, visual disturbance, changes in speech, confusion, tinnitus, etc.  Past Medical History:  Diagnosis Date  . Abscess of right hip 04/11/2012  . 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 Gastric ulcer   . Hypertension   . Medial epicondylitis   . Nephrolithiasis   . Skin cancer    20 removed    Patient Active Problem List   Diagnosis Date Noted  . Anemia 07/15/2015  . Leukocytosis 07/15/2015  . Sepsis ([place]) 07/14/2015  . Weakness 07/14/2015  . Essential hypertension 07/14/2015  . CAP (community acquired pneumonia) 07/12/2015  . Hypertension 02/16/2014  . Acute encephalopathy 02/16/2014  . Acute sinusitis 02/16/2014  . TIA  (transient ischemic attack) 02/16/2014  . Acute kidney injury ([place]) 02/16/2014  . 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 Abscess of right hip 04/11/2012  . ALCOHOL ABUSE 05/24/2010  . SYNCOPE AND COLLAPSE 05/24/2010  . SHORTNESS OF BREATH 05/24/2010  . SNORING 05/24/2010    Past Surgical History:  Procedure Laterality Date  . I&D EXTREMITY  04/14/2012   Procedure: IRRIGATION AND DEBRIDEMENT EXTREMITY;  Surgeon: Johnny Bridge, MD;  Location: [facility];  Service: Orthopedics;  Laterality: Right;  I & D Hip  . TOTAL HIP ARTHROPLASTY Right 11/26/2012   Procedure: TOTAL HIP ARTHROPLASTY;  Surgeon: Johnny Bridge, MD;  Location: [facility];  Service: Orthopedics;  Laterality: Right;       Home Medications    Prior to Admission medications   Medication Sig Start Date End Date Taking? Authorizing Provider  ALPRAZolam Duanne Moron) 0.5 MG tablet Take 0.5 mg by mouth at bedtime. 06/27/15   Historical Provider, MD  benazepril (LOTENSIN) 20 MG tablet Take 20 mg by mouth daily.     Historical Provider, MD  diazepam (VALIUM) 5 MG tablet Take 5 mg by mouth daily as needed for anxiety.  06/27/15   Historical Provider, MD  fluticasone (FLONASE) 50 MCG/ACT nasal spray Place 2 sprays into both nostrils daily. 02/17/14   Barton Dubois, MD  lactose free nutrition (BOOST) LIQD Take  237 mLs by mouth daily.    Historical Provider, MD  levofloxacin (LEVAQUIN) 500 MG tablet Take 1 tablet (500 mg total) by mouth daily. 07/15/15   Costin Karlyne Greenspan, MD  loratadine (CLARITIN) 10 MG tablet Take 1 tablet (10 mg total) by mouth daily. 02/17/14   Barton Dubois, MD  oxycodone (ROXICODONE) 30 MG immediate release tablet Take 1 tablet (30 mg total) by mouth every 6 (six) hours as needed for pain. 02/17/14   Barton Dubois, MD  pantoprazole (PROTONIX) 40 MG tablet Take 40 mg by mouth daily. 07/08/15   Historical Provider, MD  silver sulfADIAZINE (SILVADENE) 1 % cream Apply 1 application topically daily as needed (FOR  INNER THIGH).  04/17/15   Historical Provider, MD  tamsulosin (FLOMAX) 0.4 MG CAPS capsule Take 0.4 mg by mouth every evening. 07/08/15   Historical Provider, MD    Family History Family History  Problem Relation Age of Onset  . Heart disease Father     Social History Social History  Substance Use Topics  . Smoking status: Never Smoker  . Smokeless tobacco: Not on file  . Alcohol use No     Comment: stopped 5 year ago     Allergies   Cyanocobalamin   Review of Systems Review of Systems  Neurological: Positive for weakness.  All other systems reviewed and are negative.    Physical Exam Updated Vital Signs BP 143/94 (BP Location: Left Arm)   Pulse 92   Temp 98.5 F (36.9 C) (Oral)   Resp 18   Ht 5\' 11"  (1.803 m)   Wt 96.2 kg   SpO2 97%   BMI 29.57 kg/m   Physical Exam  Constitutional: He is oriented to person, place, and time. He appears well-developed and well-nourished.  HENT:  Head: Normocephalic and atraumatic.  Mouth/Throat: Oropharynx is clear and moist.  Eyes: Conjunctivae and EOM are normal. Pupils are equal, round, and reactive to light.  Eyes crossing midline, no nystagmus, normal confrontation; no appreciable field cuts  Neck: Normal range of motion.  Cardiovascular: Normal rate, regular rhythm and normal heart sounds.   Pulmonary/Chest: Effort normal and breath sounds normal.  Abdominal: Soft. Bowel sounds are normal.  Musculoskeletal: Normal range of motion.  Some pain noted with movement of left shoulder; no gross deformities or signs of dislocation  Neurological: He is alert and oriented to person, place, and time.  AAOx3, answering questions and following commands appropriately; weakness of the left upper and lower extremity when compared with right; unable to lift left arm or leg without assistance/struggle; moving right arm/leg normally; normal speech; normal sensation throughout; no facial droop; normal tongue protrusion; symmetric smile  Skin:  Skin is warm and dry.  Psychiatric: He has a normal mood and affect.  Nursing note and vitals reviewed.    ED Treatments / Results  Labs (all labs ordered are listed, but only abnormal results are displayed) Labs Reviewed  COMPREHENSIVE METABOLIC PANEL - Abnormal; Notable for the following:       Result Value   Glucose, Bld 117 (*)    ALT 14 (*)    All other components within normal limits  PROTIME-INR  APTT  CBC  DIFFERENTIAL  I-STAT TROPOININ, ED    EKG  EKG Interpretation None       Radiology Dg Chest 2 View  Result Date: 07/18/2016 CLINICAL DATA:  Mild hypoxia.  Recent pneumonia EXAM: CHEST  2 VIEW COMPARISON:  05/04/2016 FINDINGS: Chronic elevation right hemidiaphragm unchanged with right lower  lobe atelectasis. No superimposed infiltrate or effusion. Negative for heart failure or mass lesion. IMPRESSION: No active cardiopulmonary disease. Electronically Signed   By: Franchot Gallo M.D.   On: 07/18/2016 12:55   Ct Head Wo Contrast  Result Date: 07/18/2016 CLINICAL DATA:  Acute weakness in left arm.  Headache last night. EXAM: CT HEAD WITHOUT CONTRAST TECHNIQUE: Contiguous axial images were obtained from the base of the skull through the vertex without intravenous contrast. COMPARISON:  05/04/2016 FINDINGS: Brain: No evidence of acute infarction, hemorrhage, hydrocephalus, extra-axial collection or mass lesion/mass effect. Prominence of the sulci and ventricles noted. There is Vascular: No hyperdense vessel or unexpected calcification. Skull: Normal. Negative for fracture or focal lesion. Sinuses/Orbits: No acute finding. Other: None. IMPRESSION: No acute intracranial abnormality. 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 Electronically Signed   By: Kerby Moors M.D.   On: 07/18/2016 12:51    Procedures Procedures (including critical care time)  Medications Ordered in ED Medications - No data to display   Initial Impression /  Assessment and Plan / ED Course  I have reviewed the triage vital signs and the nursing notes.  Pertinent labs & imaging results that were available during my care of the patient were reviewed by me and considered in my medical decision making (see chart for details).  Clinical Course   70 year old male here with new onset left sided weakness at 3 AM. Went to bed normally at 10-11pm last night.  No hx of TIA or stroke.  On exam he does have weakness of his left arm and leg when compared with right.  No other deficits noted.  Screening labs reassuring.  CXR clear.  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  NIH scale 2 at this time.  1:16 PM Spoke with neurology, Dr. Leonel Ramsay-- recommends CTA head/neck STAT.  He will come evaluate shortly.  2:05 PM Neurology has evaluated, cancelled CTA head/neck.  They feel his exam is very inconsistent on multiple attempts. Feel his symptoms are likely secondary to pain. Patient does have chronic pain for which he takes oxycodone. Neurology recommends outpatient MRI of his cervical and lumbar spine if further imaging desired. Symptoms suspected due to degenerative changes vs muscular etiology.  See their note for full details.  Patient has orthopedist that he sees-- Dr. Mardelle Matte.  Will refer back to him for ongoing care.  Continue his home oxycodone for pain.  Return precautions given for any new/worsening symptoms.  Final Clinical Impressions(s) / ED Diagnoses   Final diagnoses:  Left-sided weakness    New Prescriptions New Prescriptions   No medications on file     Larene Pickett, PA-C 07/18/16 1523    Courteney Lyn Mackuen, MD 07/19/16 1018

## 2016-07-18 NOTE — ED Notes (Signed)
Pt taken to CT.

## 2016-07-18 NOTE — ED Notes (Signed)
Pt. Took 30mg  oxycodone IR (home medication) while RN was out of the room. EDP made aware.

## 2016-07-18 NOTE — ED Triage Notes (Signed)
Patient reports that when he went to bed last night had no weakness in arms or legs. When he got up to go to bathroom had numbness and weakness 0300 to left side. No blurred vision, speech clear, alert and oriented. Unsteady gait and left grip weakness.

## 2016-07-18 NOTE — Consult Note (Signed)
NEURO HOSPITALIST CONSULT NOTE   Requestig physician: Dr. Thomasene Lot   Reason for Consult: LUE and LLE weakness and pain   History obtained from:  Patient     HPI:                                                                                                                                          Darquan Hiles. Kidane is an 70 y.o. 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 Patient states that over the past 2 weeks he is noted that he is having pain in his left groin region along with lower back. Last night he awoke around 3 AM and noted significant pain along the lateral aspect of his left neck radiating to his left shoulder. Patient feels as though his left shoulder and arm is weak however admits that majority of the weakness is secondary to pain at this time. Patient denies any numbness or tingling throughout.  Past Medical History:  Diagnosis Date  . Abscess of right hip 04/11/2012  . Avascular necrosis of right femoral head ([place]) 11/26/2012  . Gastric ulcer   . Hypertension   . Medial epicondylitis   . Nephrolithiasis   . Skin cancer    20 removed    Past Surgical History:  Procedure Laterality Date  . I&D EXTREMITY  04/14/2012   Procedure: IRRIGATION AND DEBRIDEMENT EXTREMITY;  Surgeon: Johnny Bridge, MD;  Location: [facility];  Service: Orthopedics;  Laterality: Right;  I & D Hip  . TOTAL HIP ARTHROPLASTY Right 11/26/2012   Procedure: TOTAL HIP ARTHROPLASTY;  Surgeon: Johnny Bridge, MD;  Location: [facility];  Service: Orthopedics;  Laterality: Right;    Family History  Problem Relation Age of Onset  . Heart disease Father      Social History:  reports that he has never smoked. He does not have any  smokeless tobacco history on file. He reports that he does not drink alcohol or use drugs.  Allergies  Allergen Reactions  . Cyanocobalamin Nausea Only    MEDICATIONS:  No current facility-administered medications for this encounter.    Current Outpatient Prescriptions  Medication Sig Dispense Refill  . Aspirin-Salicylamide-Caffeine (BC HEADACHE POWDER PO) Take 1 packet by mouth daily as needed (headache).    . gabapentin (NEURONTIN) 300 MG capsule Take 300 mg by mouth daily.    Marland Kitchen oxycodone (ROXICODONE) 30 MG immediate release tablet Take 1 tablet (30 mg total) by mouth every 6 (six) hours as needed for pain.    . pantoprazole (PROTONIX) 40 MG tablet Take 40 mg by mouth daily.    . tamsulosin (FLOMAX) 0.4 MG CAPS capsule Take 0.4 mg by mouth every evening.    . Vitamin D, Ergocalciferol, (DRISDOL) 50000 units CAPS capsule Take 50,000 Units by mouth every 7 (seven) days.    . fluticasone (FLONASE) 50 MCG/ACT nasal spray Place 2 sprays into both nostrils daily. (Patient not taking: Reported on 07/18/2016) 16 g 2  . levofloxacin (LEVAQUIN) 500 MG tablet Take 1 tablet (500 mg total) by mouth daily. (Patient not taking: Reported on 07/18/2016) 5 tablet 0  . loratadine (CLARITIN) 10 MG tablet Take 1 tablet (10 mg total) by mouth daily. (Patient not taking: Reported on 07/18/2016) 30 tablet 1   di   ROS:                                                                                                                                       History obtained from the patient  General ROS: negative for - chills, fatigue, fever, night sweats, weight gain or weight loss Psychological ROS: negative for - behavioral disorder, hallucinations, memory difficulties, mood swings or suicidal ideation Ophthalmic ROS: negative for - blurry vision, double vision, eye pain or loss of vision ENT ROS:  negative for - epistaxis, nasal discharge, oral lesions, sore throat, tinnitus or vertigo Allergy and Immunology ROS: negative for - hives or itchy/watery eyes Hematological and Lymphatic ROS: negative for - bleeding problems, bruising or swollen lymph nodes Endocrine ROS: negative for - galactorrhea, hair pattern changes, polydipsia/polyuria or temperature intolerance Respiratory ROS: negative for - cough, hemoptysis, shortness of breath or wheezing Cardiovascular ROS: negative for - chest pain, dyspnea on exertion, edema or irregular heartbeat Gastrointestinal ROS: negative for - abdominal pain, diarrhea, hematemesis, nausea/vomiting or stool incontinence Genito-Urinary ROS: negative for - dysuria, hematuria, incontinence or urinary frequency/urgency Musculoskeletal ROS: negative for - joint swelling or muscular weakness Neurological ROS: as noted in HPI Dermatological ROS: negative for rash and skin lesion changes   Blood pressure 142/94, pulse 91, temperature 98.5 F (36.9 C), resp. rate 21, height 5\' 11"  (1.803 m), weight 96.2 kg (212 lb), SpO2 97 %.   Neurologic Examination:  HEENT-  Normocephalic, no lesions, without obvious abnormality.  Normal external eye and conjunctiva.  Normal TM's bilaterally.  Normal auditory canals and external ears. Normal external nose, mucus membranes and septum.  Normal pharynx. Cardiovascular- S1, S2 normal, pulses palpable throughout   Lungs- chest clear, no wheezing, rales, normal symmetric air entry Abdomen- normal findings: bowel sounds normal Extremities- no edema Lymph-no adenopathy palpable Musculoskeletal-positive for joint tenderness and left shoulder and bilateral hips Skin-dry  Neurological Examination Mental Status: Alert, oriented, thought content appropriate.  Speech fluent without evidence of aphasia.  Able to follow 3 step commands  without difficulty. Cranial Nerves: II:  Visual fields grossly normal, pupils equal, round, reactive to light and accommodation III,IV, VI: ptosis not present, extra-ocular motions intact bilaterally V,VII: smile symmetric, facial light touch sensation normal bilaterally VIII: hearing normal bilaterally IX,X: uvula rises symmetrically XI: bilateral shoulder shrug XII: midline tongue extension Motor: Patient has 5 out of 5 strength throughout however during examination he shows minimal effort if any effort at times with his left shoulder and left leg. If patient is distracted he will show 5-5 strength. Patient complains of significant pain along the left aspect of his trapezius, AC joint, significant tenderness in the left groin with hip rotation. During exam patient showed a positive Hoover's. Neuromuscular exam was very inconsistent throughout. Sensory: Pinprick and light touch intact throughout, bilaterally Deep Tendon Reflexes: 2+ and symmetric throughout Plantars: Right: downgoing   Left: downgoing Cerebellar: normal finger-to-nose, Gait: Not tested      Lab Results: Basic Metabolic Panel:  Recent Labs Lab 07/18/16 1152  NA 139  K 3.8  CL 102  CO2 29  GLUCOSE 117*  BUN 14  CREATININE 1.06  CALCIUM 9.4    Liver Function Tests:  Recent Labs Lab 07/18/16 1152  AST 21  ALT 14*  ALKPHOS 89  BILITOT 0.6  PROT 6.9  ALBUMIN 4.1   No results for input(s): LIPASE, AMYLASE in the last 168 hours. No results for input(s): AMMONIA in the last 168 hours.  CBC:  Recent Labs Lab 07/18/16 1152  WBC 5.9  NEUTROABS 3.8  HGB 14.0  HCT 44.5  MCV 96.1  PLT 173    Cardiac Enzymes: No results for input(s): CKTOTAL, CKMB, CKMBINDEX, TROPONINI in the last 168 hours.  Lipid Panel: No results for input(s): CHOL, TRIG, HDL, CHOLHDL, VLDL, LDLCALC in the last 168 hours.  CBG: No results for input(s): GLUCAP in the last 168 hours.  Microbiology: Results for orders  placed or performed during the hospital encounter of 07/12/15  Urine culture     Status: None   Collection Time: 07/12/15  4:45 PM  Result Value Ref Range Status   Specimen Description URINE, RANDOM  Final   Special Requests NONE  Final   Culture NO GROWTH 1 DAY  Final   Report Status 07/13/2015 FINAL  Final  Culture, blood (routine x 2) Call MD if unable to obtain prior to antibiotics being given     Status: None   Collection Time: 07/12/15  5:25 PM  Result Value Ref Range Status   Specimen Description BLOOD LEFT HAND  Final   Special Requests BOTTLES DRAWN AEROBIC AND ANAEROBIC 5CC  Final   Culture NO GROWTH 5 DAYS  Final   Report Status 07/17/2015 FINAL  Final  Culture, blood (routine x 2) Call MD if unable to obtain prior to antibiotics being given     Status: None   Collection Time: 07/12/15  5:30 PM  Result  Value Ref Range Status   Specimen Description BLOOD RIGHT ARM  Final   Special Requests   Final    BOTTLES DRAWN AEROBIC AND ANAEROBIC RED 5CC BLUE 10CC   Culture NO GROWTH 5 DAYS  Final   Report Status 07/17/2015 FINAL  Final  MRSA PCR Screening     Status: None   Collection Time: 07/12/15  6:36 PM  Result Value Ref Range Status   MRSA by PCR NEGATIVE NEGATIVE Final    Comment:        The GeneXpert MRSA Assay (FDA approved for NASAL specimens only), is one component of a comprehensive MRSA colonization surveillance program. It is not intended to diagnose MRSA infection nor to guide or monitor treatment for MRSA infections.     Coagulation Studies:  Recent Labs  07/18/16 1152  LABPROT 13.7  INR 1.04    Imaging: Dg Chest 2 View  Result Date: 07/18/2016 CLINICAL DATA:  Mild hypoxia.  Recent pneumonia EXAM: CHEST  2 VIEW COMPARISON:  05/04/2016 FINDINGS: Chronic elevation right hemidiaphragm unchanged with right lower lobe atelectasis. No superimposed infiltrate or effusion. Negative for heart failure or mass lesion. IMPRESSION: No active cardiopulmonary  disease. Electronically Signed   By: Franchot Gallo M.D.   On: 07/18/2016 12:55   Ct Head Wo Contrast  Result Date: 07/18/2016 CLINICAL DATA:  Acute weakness in left arm.  Headache last night. EXAM: CT HEAD WITHOUT CONTRAST TECHNIQUE: Contiguous axial images were obtained from the base of the skull through the vertex without intravenous contrast. COMPARISON:  05/04/2016 FINDINGS: Brain: No evidence of acute infarction, hemorrhage, hydrocephalus, extra-axial collection or mass lesion/mass effect. Prominence of the sulci and ventricles noted. There is Vascular: No hyperdense vessel or unexpected calcification. Skull: Normal. Negative for fracture or focal lesion. Sinuses/Orbits: No acute finding. Other: None. IMPRESSION: No acute intracranial abnormality. 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 Electronically Signed   By: Kerby Moors M.D.   On: 07/18/2016 12:51       Assessment and plan per attending neurologist  Etta Quill PA-C [facility] [phone]  07/18/2016, 2:15 PM   Assessment/Plan: This 70 year old male presenting to the emergency department with 2+ weeks of left leg weakness and left hip pain in addition to new onset left shoulder pain. Exam is limited from pain throughout the left side however he does show 5/5 strength throughout when continuously prompted. Differential at this time includes trapezius pull, possible cervical spine and lumbar spine degeneration.  At this time, I would recommend conservative therapy but if he continues to have problems then could consider a cervical and lumbar spine MRI.  Roland Rack, MD [facility] [phone]  If 7pm- 7am, please page neurology on call as listed in [place].

## 2016-07-18 NOTE — ED Notes (Signed)
Pt's SpO2 was fluctuating between 90-92%. Pt put on 1L via nasal cannula. Pt's SpO2 increased to 95%. Rn approved.

## 2016-07-19 DIAGNOSIS — I4510 Unspecified right bundle-branch block: Secondary | ICD-10-CM

## 2016-07-19 HISTORY — DX: Unspecified right bundle-branch block: I45.10

## 2016-07-21 ENCOUNTER — Other Ambulatory Visit: Payer: Self-pay | Admitting: Orthopedic Surgery

## 2016-07-21 DIAGNOSIS — M79621 Pain in right upper arm: Secondary | ICD-10-CM

## 2016-07-21 DIAGNOSIS — R531 Weakness: Secondary | ICD-10-CM

## 2016-07-21 DIAGNOSIS — M549 Dorsalgia, unspecified: Secondary | ICD-10-CM

## 2016-07-21 DIAGNOSIS — M79622 Pain in left upper arm: Principal | ICD-10-CM

## 2016-08-01 ENCOUNTER — Ambulatory Visit
Admission: RE | Admit: 2016-08-01 | Discharge: 2016-08-01 | Disposition: A | Discharge status: Home / Self Care | Payer: Medicare Other | Source: Ambulatory Visit | Attending: Orthopedic Surgery | Admitting: Orthopedic Surgery

## 2016-08-01 DIAGNOSIS — M549 Dorsalgia, unspecified: Secondary | ICD-10-CM

## 2016-08-01 DIAGNOSIS — M79621 Pain in right upper arm: Secondary | ICD-10-CM

## 2016-08-01 DIAGNOSIS — M79622 Pain in left upper arm: Principal | ICD-10-CM

## 2016-08-01 DIAGNOSIS — R531 Weakness: Secondary | ICD-10-CM

## 2016-09-15 IMAGING — CT CT CHEST W/ CM
2 of 5 series · 14 of 36 positions shown, 17 images · IV contrast (APPLIED)
Comparison: None.

CLINICAL DATA: Sepsis, back pain, hemoptysis

EXAM:
CT CHEST, ABDOMEN, AND PELVIS WITH CONTRAST
TECHNIQUE: Multidetector CT imaging of the chest, abdomen and pelvis was
performed following the standard protocol during bolus
administration of intravenous contrast.
CONTRAST:  100mL OMNIPAQUE IOHEXOL 300 MG/ML  SOLN

[Series 2: cap 5.0 i31f 1 · axial · 0.95mm/px · z∈[+787,+1372]mm · 11 of 136 slices shown, 14 images]
[im 10/136  mediastinal]
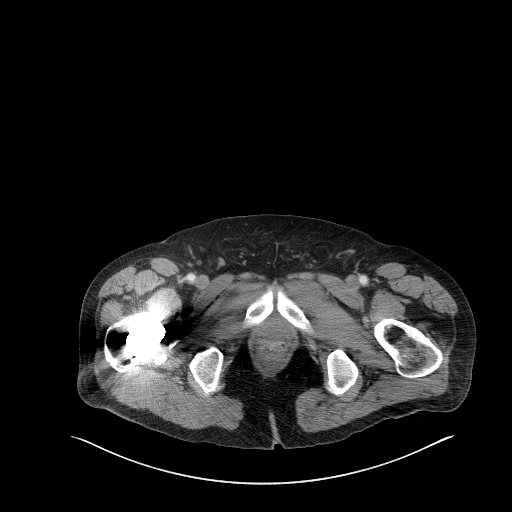
[im 10/136  lung]
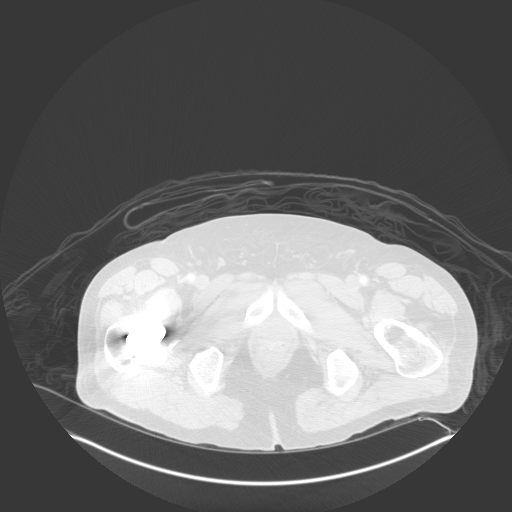
[im 19/136  lung]
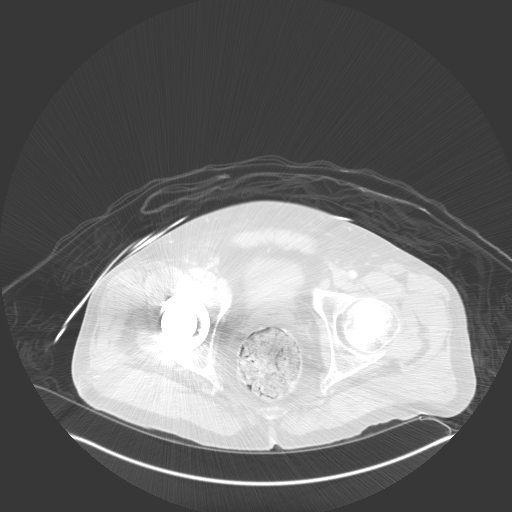
[im 37/136  lung]
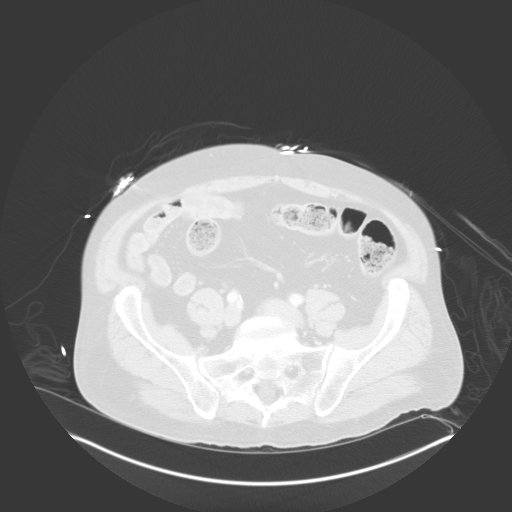
[im 46/136  lung]
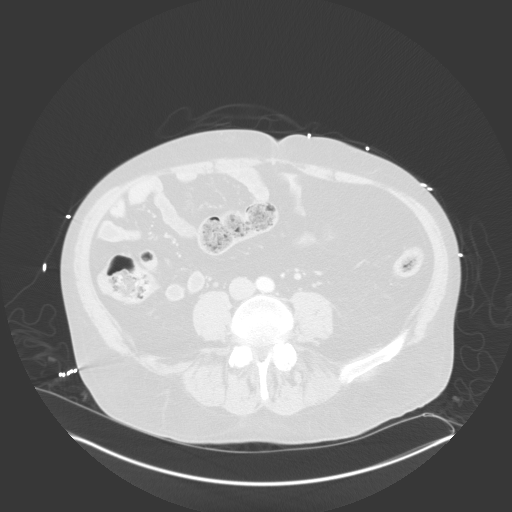
[im 55/136  mediastinal]
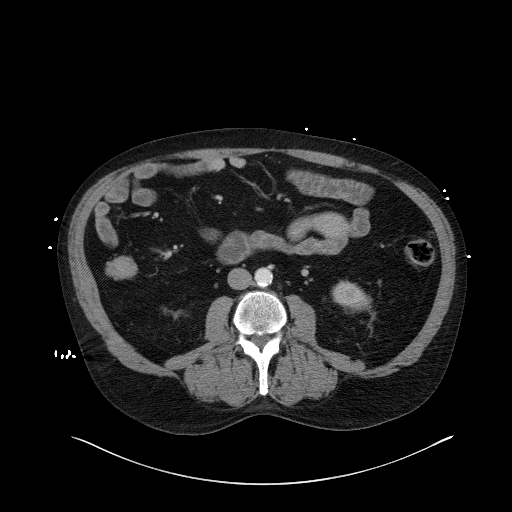
[im 55/136  lung]
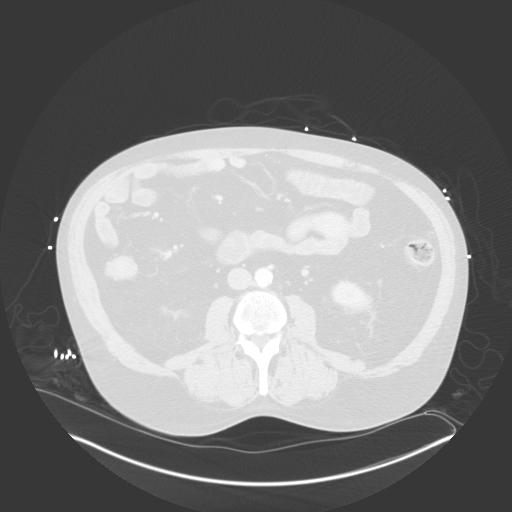
[im 73/136  lung]
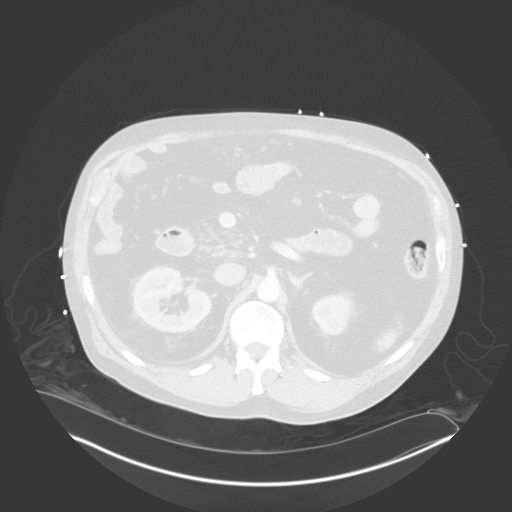
[im 82/136  lung]
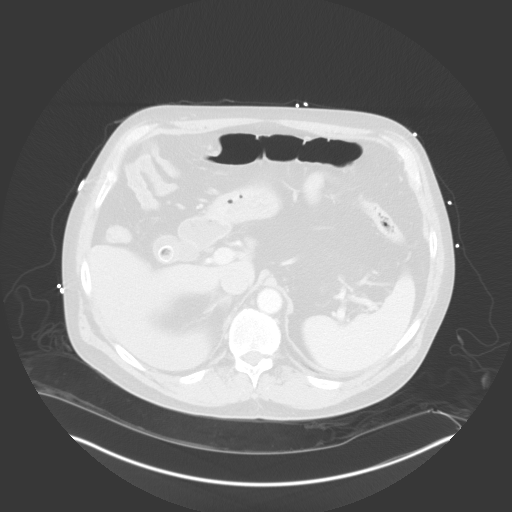
[im 91/136  lung]
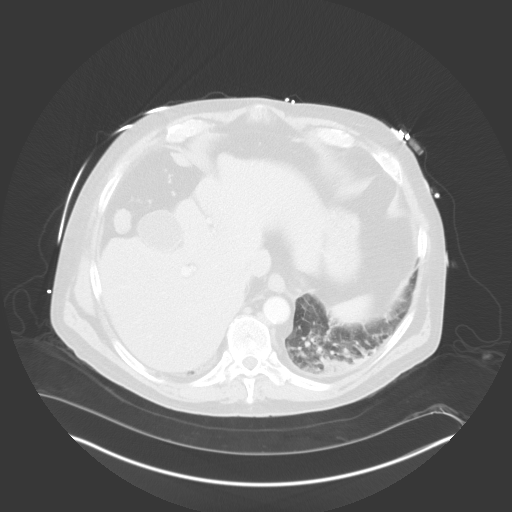
[im 100/136  mediastinal]
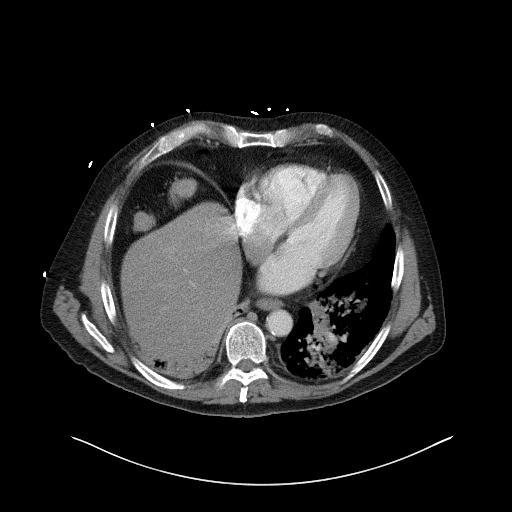
[im 100/136  lung]
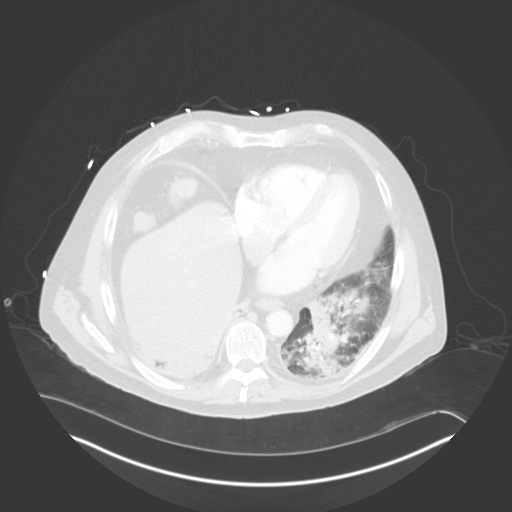
[im 118/136  lung]
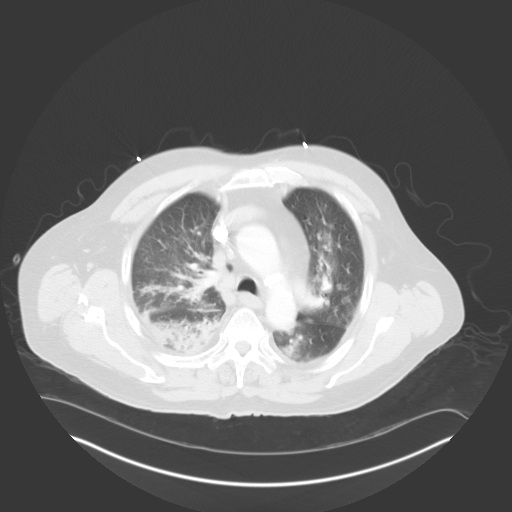
[im 127/136  lung]
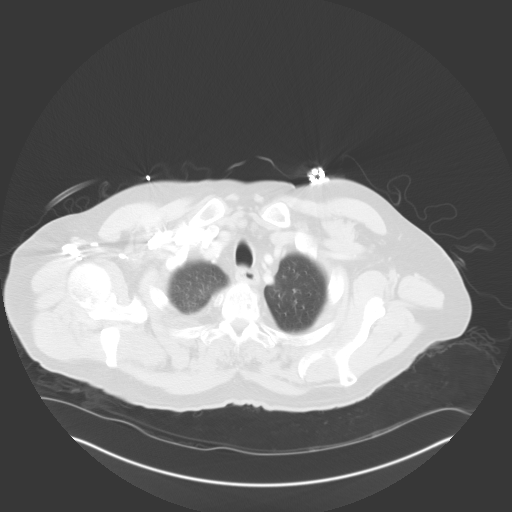

[Series 5: coronal · coronal · 0.83mm/px · 3 of 112 slices shown]
[im 23/112  lung]
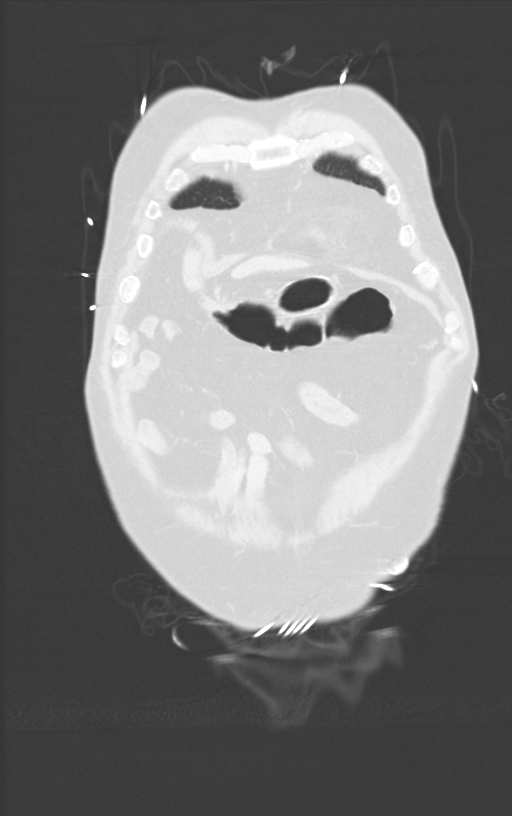
[im 45/112  lung]
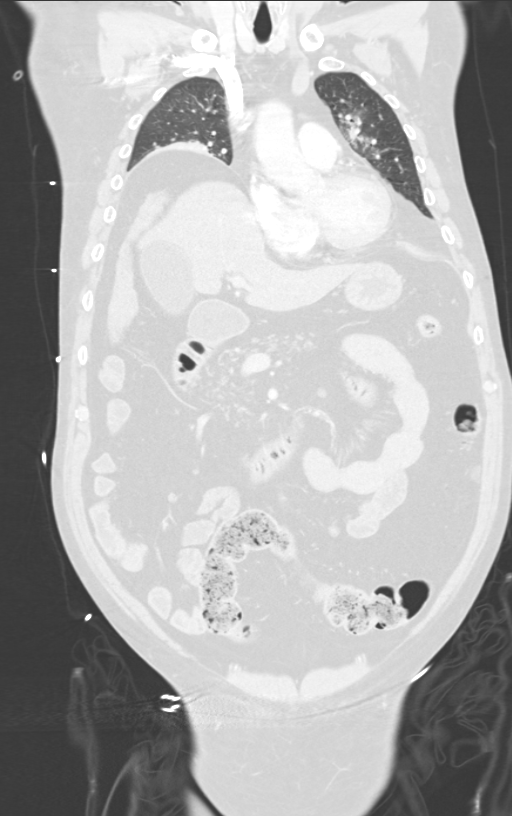
[im 67/112  lung]
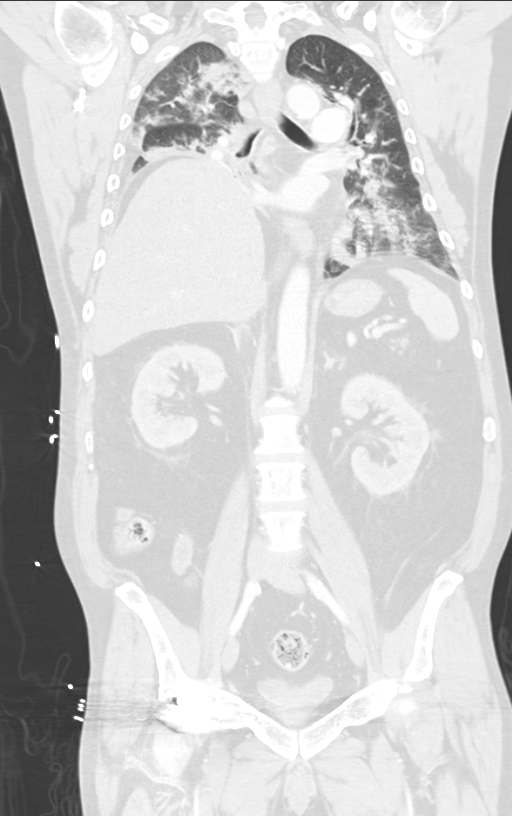

[14 of 36 positions shown; findings below may reference images not displayed]

FINDINGS: CT CHEST FINDINGS

Mediastinum/Nodes: No lymphadenopathy. Normal heart size. No
mediastinal hematoma. Normal esophagus. Normal caliber thoracic
aorta. Mild coronary artery atherosclerosis in the LAD and RCA.

Lungs/Pleura: No significant pleural effusion. Dense right lower
lobe consolidation. Patchy areas of airspace disease in the right
upper lobe, left upper lobe and left lower lobe. No pneumothorax.

Musculoskeletal: No acute osseous abnormality. No aggressive lytic
or sclerotic osseous lesion.

CT ABDOMEN PELVIS FINDINGS

Hepatobiliary: Low attenuation of the liver as can be seen with
hepatic steatosis. No focal hepatic mass. No intrahepatic or
extrahepatic biliary ductal dilatation. Cholelithiasis.

Pancreas: Atrophic pancreas.  No focal pancreatic mass.

Spleen: Normal.

Adrenals/Urinary Tract: Normal adrenal glands. Normal kidneys. No
urolithiasis or obstructive uropathy. Mild nonspecific bilateral
perinephric stranding. Normal partially decompressed bladder.

Stomach/Bowel: No bowel dilatation or bowel wall thickening. No
pneumatosis, pneumoperitoneum or portal venous gas. No abdominal or
pelvic free fluid. Fact containing right inguinal hernia.

Vascular/Lymphatic: Normal caliber abdominal aorta with
atherosclerosis. No abdominal or pelvic lymphadenopathy.

Other: No fluid collection or hematoma.

Musculoskeletal: No aggressive lytic or sclerotic osseous lesion. No
acute osseous abnormality. Total right hip arthroplasty with beam
hardening artifact partially obscuring the adjacent soft tissue and
osseous structures.
IMPRESSION: 1. Bilateral multilobar pneumonia, most severe in the right lower
lobe.
2. No acute abdominal or pelvic pathology.
3. Hepatic steatosis.

## 2017-02-08 ENCOUNTER — Encounter (INDEPENDENT_AMBULATORY_CARE_PROVIDER_SITE_OTHER): Payer: Self-pay | Admitting: Internal Medicine

## 2017-02-08 ENCOUNTER — Encounter (INDEPENDENT_AMBULATORY_CARE_PROVIDER_SITE_OTHER): Payer: Self-pay

## 2017-02-21 ENCOUNTER — Ambulatory Visit (INDEPENDENT_AMBULATORY_CARE_PROVIDER_SITE_OTHER): Payer: Medicare Other | Admitting: Internal Medicine

## 2017-02-21 ENCOUNTER — Encounter (INDEPENDENT_AMBULATORY_CARE_PROVIDER_SITE_OTHER): Payer: Self-pay | Admitting: *Deleted

## 2017-02-21 ENCOUNTER — Telehealth (INDEPENDENT_AMBULATORY_CARE_PROVIDER_SITE_OTHER): Payer: Self-pay | Admitting: *Deleted

## 2017-02-21 ENCOUNTER — Other Ambulatory Visit (INDEPENDENT_AMBULATORY_CARE_PROVIDER_SITE_OTHER): Payer: Self-pay | Admitting: Internal Medicine

## 2017-02-21 ENCOUNTER — Encounter (INDEPENDENT_AMBULATORY_CARE_PROVIDER_SITE_OTHER): Payer: Self-pay | Admitting: Internal Medicine

## 2017-02-21 VITALS — BP 126/80 | HR 60 | Temp 97.6°F | Ht 69.0 in | Wt 214.9 lb

## 2017-02-21 DIAGNOSIS — R195 Other fecal abnormalities: Secondary | ICD-10-CM

## 2017-02-21 LAB — CBC WITH DIFFERENTIAL/PLATELET
BASOS ABS: 0 {cells}/uL (ref 0–200)
Basophils Relative: 0 %
EOS PCT: 3 %
Eosinophils Absolute: 141 cells/uL (ref 15–500)
HCT: 42.2 % (ref 38.5–50.0)
Hemoglobin: 13.4 g/dL (ref 13.2–17.1)
LYMPHS PCT: 23 %
Lymphs Abs: 1081 cells/uL (ref 850–3900)
MCH: 29.9 pg (ref 27.0–33.0)
MCHC: 31.8 g/dL — AB (ref 32.0–36.0)
MCV: 94.2 fL (ref 80.0–100.0)
MONOS PCT: 10 %
MPV: 10.5 fL (ref 7.5–12.5)
Monocytes Absolute: 470 cells/uL (ref 200–950)
NEUTROS PCT: 64 %
Neutro Abs: 3008 cells/uL (ref 1500–7800)
PLATELETS: 162 10*3/uL (ref 140–400)
RBC: 4.48 MIL/uL (ref 4.20–5.80)
RDW: 14.3 % (ref 11.0–15.0)
WBC: 4.7 10*3/uL (ref 3.8–10.8)

## 2017-02-21 MED ORDER — PEG 3350-KCL-NA BICARB-NACL 420 G PO SOLR: 4000.0000 mL | Freq: Once | ORAL | 0 refills | Status: AC

## 2017-02-21 NOTE — Progress Notes (Signed)
Subjective:    Patient ID: [name] [name], male    DOB: Aug 29, 1946, 71 y.o.   MRN: [id number]  HPI Referred by Dr. Octavio Graves for positive stool card. He tells me he saw blood a few days ago. He says it was bright red in color. No weight loss. Appetite is good.  Has a BM x 1 a day. No melena. States he has mid rt abdominal pain. Feels like needles are sticking him.   No family hx o colon cancer. Takes Goody Powders x 2 on occasion for headaches. Takes Oxycodone x 6 a day for neck, back and rt hip pain.  Last colonoscopy was years ago ( patient states about 40 yrs ago) and was normal in [place].  Hx of etoh abuse but has not drank in two years. Last CBC in October 2017 was normal.   CBC     Component Value Date/Time   WBC 5.9 07/18/2016 1152   RBC 4.63 07/18/2016 1152   HGB 14.0 07/18/2016 1152   HCT 44.5 07/18/2016 1152   PLT 173 07/18/2016 1152   MCV 96.1 07/18/2016 1152   MCH 30.2 07/18/2016 1152   MCHC 31.5 07/18/2016 1152   RDW 12.7 07/18/2016 1152   LYMPHSABS 1.4 07/18/2016 1152   MONOABS 0.5 07/18/2016 1152   EOSABS 0.2 07/18/2016 1152   BASOSABS 0.0 07/18/2016 1152     Review of Systems Past Medical History:  Diagnosis Date  . Abscess of right hip 04/11/2012  . Avascular necrosis of right femoral head ([place]) 11/26/2012  . Gastric ulcer   . Hypertension   . Medial epicondylitis   . Nephrolithiasis   . Skin cancer    20 removed    Past Surgical History:  Procedure Laterality Date  . I&D EXTREMITY  04/14/2012   Procedure: IRRIGATION AND DEBRIDEMENT EXTREMITY;  Surgeon: Johnny Bridge, MD;  Location: [facility];  Service: Orthopedics;  Laterality: Right;  I & D Hip  . TOTAL HIP ARTHROPLASTY Right 11/26/2012   Procedure: TOTAL HIP ARTHROPLASTY;  Surgeon: Johnny Bridge, MD;  Location: [facility];  Service: Orthopedics;  Laterality: Right;    Allergies  Allergen Reactions  . Ambien [Zolpidem Tartrate]     hallucination  . Cyanocobalamin Nausea Only     Current Outpatient Prescriptions on File Prior to Visit  Medication Sig Dispense Refill  . Aspirin-Salicylamide-Caffeine (BC HEADACHE POWDER PO) Take 1 packet by mouth daily as needed (headache).    . gabapentin (NEURONTIN) 300 MG capsule Take 300 mg by mouth daily.    Marland Kitchen oxycodone (ROXICODONE) 30 MG immediate release tablet Take 1 tablet (30 mg total) by mouth every 6 (six) hours as needed for pain.    . pantoprazole (PROTONIX) 40 MG tablet Take 40 mg by mouth daily.    . tamsulosin (FLOMAX) 0.4 MG CAPS capsule Take 0.4 mg by mouth every evening.    . Vitamin D, Ergocalciferol, (DRISDOL) 50000 units CAPS capsule Take 50,000 Units by mouth every 7 (seven) days.    . fluticasone (FLONASE) 50 MCG/ACT nasal spray Place 2 sprays into both nostrils daily. (Patient not taking: Reported on 07/18/2016) 16 g 2   No current facility-administered medications on file prior to visit.        Objective:   Physical Exam Blood pressure 126/80, pulse 60, temperature 97.6 F (36.4 C), height 5\' 9"  (1.753 m), weight 214 lb 14.4 oz (97.5 kg). Alert and oriented. Skin warm and dry. Oral mucosa is moist.   .  Sclera anicteric, conjunctivae is pink. Thyroid not enlarged. No cervical lymphadenopathy. Lungs clear. Heart regular rate and rhythm.  Abdomen is soft. Bowel sounds are positive. No hepatomegaly. No abdominal masses felt. Tenderness just to rt of umbilicus.  No edema to lower extremities.          Assessment & Plan:  Heme positive stool. Colonic neoplasm, polyp, ulcer, hemorrhoids needs to be ruled out. Will get a CBC on him today since he has not had one since October of 2017. Colonoscopy. The risks and benefits such as perforation, bleeding, and infection were reviewed with the patient and is agreeable.

## 2017-02-21 NOTE — Telephone Encounter (Signed)
Patient needs trilyte 

## 2017-02-21 NOTE — Patient Instructions (Addendum)
Colonoscopy.  The risks and benefits such as perforation, bleeding, and infection were reviewed with the patient and is agreeable. 

## 2017-02-22 NOTE — Patient Instructions (Signed)
[name] [name]  02/22/2017     @PREFPERIOPPHARMACY @   Your procedure is scheduled on  02/27/2017   Report to [facility] at  615  A.M.  Call this number if you have problems the morning of surgery:  [phone]   Remember:  Do not eat food or drink liquids after midnight.  Take these medicines the morning of surgery with A SIP OF WATER  Gabapentin, oxycodone, protonix, phenergan.   Do not wear jewelry, make-up or nail polish.  Do not wear lotions, powders, or perfumes, or deoderant.  Do not shave 48 hours prior to surgery.  Men may shave face and neck.  Do not bring valuables to the hospital.  [facility] is not responsible for any belongings or valuables.  Contacts, dentures or bridgework may not be worn into surgery.  Leave your suitcase in the car.  After surgery it may be brought to your room.  For patients admitted to the hospital, discharge time will be determined by your treatment team.  Patients discharged the day of surgery will not be allowed to drive home.   Name and phone number of your driver:   family Special instructions:  Follow the diet and prep instructions given to you by Dr Olevia Perches office.  Please read over the following fact sheets that you were given. Anesthesia Post-op Instructions and Care and Recovery After Surgery       Colonoscopy, Adult A colonoscopy is an exam to look at the entire large intestine. During the exam, a lubricated, bendable tube is inserted into the anus and then passed into the rectum, colon, and other parts of the large intestine. A colonoscopy is often done as a part of normal colorectal screening or in response to certain symptoms, such as anemia, persistent diarrhea, abdominal pain, and blood in the stool. The exam can help screen for and diagnose medical problems, including:  Tumors.  Polyps.  Inflammation.  Areas of bleeding. Tell a health care provider about:  Any allergies you have.  All  medicines you are taking, including vitamins, herbs, eye drops, creams, and over-the-counter medicines.  Any problems you or family members have had with anesthetic medicines.  Any blood disorders you have.  Any surgeries you have had.  Any medical conditions you have.  Any problems you have had passing stool. What are the risks? Generally, this is a safe procedure. However, problems may occur, including:  Bleeding.  A tear in the intestine.  A reaction to medicines given during the exam.  Infection (rare). What happens before the procedure? Eating and drinking restrictions  Follow instructions from your health care provider about eating and drinking, which may include:  A few days before the procedure - follow a low-fiber diet. Avoid nuts, seeds, dried fruit, raw fruits, and vegetables.  1-3 days before the procedure - follow a clear liquid diet. Drink only clear liquids, such as clear broth or bouillon, black coffee or tea, clear juice, clear soft drinks or sports drinks, gelatin dessert, and popsicles. Avoid any liquids that contain red or purple dye.  On the day of the procedure - do not eat or drink anything during the 2 hours before the procedure, or within the time period that your health care provider recommends. Bowel prep  If you were prescribed an oral bowel prep to clean out your colon:  Take it as told by your health care provider. Starting the day before your procedure,  you will need to drink a large amount of medicated liquid. The liquid will cause you to have multiple loose stools until your stool is almost clear or light green.  If your skin or anus gets irritated from diarrhea, you may use these to relieve the irritation:  Medicated wipes, such as adult wet wipes with aloe and vitamin E.  A skin soothing-product like petroleum jelly.  If you vomit while drinking the bowel prep, take a break for up to 60 minutes and then begin the bowel prep again. If  vomiting continues and you cannot take the bowel prep without vomiting, call your health care provider. General instructions   Ask your health care provider about changing or stopping your regular medicines. This is especially important if you are taking diabetes medicines or blood thinners.  Plan to have someone take you home from the hospital or clinic. What happens during the procedure?  An IV tube may be inserted into one of your veins.  You will be given medicine to help you relax (sedative).  To reduce your risk of infection:  Your health care team will wash or sanitize their hands.  Your anal area will be washed with soap.  You will be asked to lie on your side with your knees bent.  Your health care provider will lubricate a long, thin, flexible tube. The tube will have a camera and a light on the end.  The tube will be inserted into your anus.  The tube will be gently eased through your rectum and colon.  Air will be delivered into your colon to keep it open. You may feel some pressure or cramping.  The camera will be used to take images during the procedure.  A small tissue sample may be removed from your body to be examined under a microscope (biopsy). If any potential problems are found, the tissue will be sent to a lab for testing.  If small polyps are found, your health care provider may remove them and have them checked for cancer cells.  The tube that was inserted into your anus will be slowly removed. The procedure may vary among health care providers and hospitals. What happens after the procedure?  Your blood pressure, heart rate, breathing rate, and blood oxygen level will be monitored until the medicines you were given have worn off.  Do not drive for 24 hours after the exam.  You may have a small amount of blood in your stool.  You may pass gas and have mild abdominal cramping or bloating due to the air that was used to inflate your colon during the  exam.  It is up to you to get the results of your procedure. Ask your health care provider, or the department performing the procedure, when your results will be ready. This information is not intended to replace advice given to you by your health care provider. Make sure you discuss any questions you have with your health care provider. Document Released: 09/23/2000 Document Revised: 07/27/2016 Document Reviewed: 12/08/2015 Elsevier Interactive Patient Education  2017 [employer]  Colonoscopy, Adult, Care After This sheet gives you information about how to care for yourself after your procedure. Your health care provider may also give you more specific instructions. If you have problems or questions, contact your health care provider. What can I expect after the procedure? After the procedure, it is common to have:  A small amount of blood in your stool for 24 hours after the procedure.  Some gas.  Mild abdominal cramping or bloating. Follow these instructions at home: General instructions    For the first 24 hours after the procedure:  Do not drive or use machinery.  Do not sign important documents.  Do not drink alcohol.  Do your regular daily activities at a slower pace than normal.  Eat soft, easy-to-digest foods.  Rest often.  Take over-the-counter or prescription medicines only as told by your health care provider.  It is up to you to get the results of your procedure. Ask your health care provider, or the department performing the procedure, when your results will be ready. Relieving cramping and bloating   Try walking around when you have cramps or feel bloated.  Apply heat to your abdomen as told by your health care provider. Use a heat source that your health care provider recommends, such as a moist heat pack or a heating pad.  Place a towel between your skin and the heat source.  Leave the heat on for 20-30 minutes.  Remove the heat if your skin turns  bright red. This is especially important if you are unable to feel pain, heat, or cold. You may have a greater risk of getting burned. Eating and drinking   Drink enough fluid to keep your urine clear or pale yellow.  Resume your normal diet as instructed by your health care provider. Avoid heavy or fried foods that are hard to digest.  Avoid drinking alcohol for as long as instructed by your health care provider. Contact a health care provider if:  You have blood in your stool 2-3 days after the procedure. Get help right away if:  You have more than a small spotting of blood in your stool.  You pass large blood clots in your stool.  Your abdomen is swollen.  You have nausea or vomiting.  You have a fever.  You have increasing abdominal pain that is not relieved with medicine. This information is not intended to replace advice given to you by your health care provider. Make sure you discuss any questions you have with your health care provider. Document Released: 05/10/2004 Document Revised: 06/20/2016 Document Reviewed: 12/08/2015 Elsevier Interactive Patient Education  2017 [place] Anesthesia is a term that refers to techniques, procedures, and medicines that help a person stay safe and comfortable during a medical procedure. Monitored anesthesia care, or sedation, is one type of anesthesia. Your anesthesia specialist may recommend sedation if you will be having a procedure that does not require you to be unconscious, such as:  Cataract surgery.  A dental procedure.  A biopsy.  A colonoscopy. During the procedure, you may receive a medicine to help you relax (sedative). There are three levels of sedation:  Mild sedation. At this level, you may feel awake and relaxed. You will be able to follow directions.  Moderate sedation. At this level, you will be sleepy. You may not remember the procedure.  Deep sedation. At this level, you will be  asleep. You will not remember the procedure. The more medicine you are given, the deeper your level of sedation will be. Depending on how you respond to the procedure, the anesthesia specialist may change your level of sedation or the type of anesthesia to fit your needs. An anesthesia specialist will monitor you closely during the procedure. Let your health care provider know about:  Any allergies you have.  All medicines you are taking, including vitamins, herbs, eye drops, creams, and  over-the-counter medicines.  Any use of steroids (by mouth or as a cream).  Any problems you or family members have had with sedatives and anesthetic medicines.  Any blood disorders you have.  Any surgeries you have had.  Any medical conditions you have, such as sleep apnea.  Whether you are pregnant or may be pregnant.  Any use of cigarettes, alcohol, or street drugs. What are the risks? Generally, this is a safe procedure. However, problems may occur, including:  Getting too much medicine (oversedation).  Nausea.  Allergic reaction to medicines.  Trouble breathing. If this happens, a breathing tube may be used to help with breathing. It will be removed when you are awake and breathing on your own.  Heart trouble.  Lung trouble. Before the procedure Staying hydrated  Follow instructions from your health care provider about hydration, which may include:  Up to 2 hours before the procedure - you may continue to drink clear liquids, such as water, clear fruit juice, black coffee, and plain tea. Eating and drinking restrictions  Follow instructions from your health care provider about eating and drinking, which may include:  8 hours before the procedure - stop eating heavy meals or foods such as meat, fried foods, or fatty foods.  6 hours before the procedure - stop eating light meals or foods, such as toast or cereal.  6 hours before the procedure - stop drinking milk or drinks that  contain milk.  2 hours before the procedure - stop drinking clear liquids. Medicines  Ask your health care provider about:  Changing or stopping your regular medicines. This is especially important if you are taking diabetes medicines or blood thinners.  Taking medicines such as aspirin and ibuprofen. These medicines can thin your blood. Do not take these medicines before your procedure if your health care provider instructs you not to. Tests and exams  You will have a physical exam.  You may have blood tests done to show:  How well your kidneys and liver are working.  How well your blood can clot.  General instructions  Plan to have someone take you home from the hospital or clinic.  If you will be going home right after the procedure, plan to have someone with you for 24 hours. What happens during the procedure?  Your blood pressure, heart rate, breathing, level of pain and overall condition will be monitored.  An IV tube will be inserted into one of your veins.  Your anesthesia specialist will give you medicines as needed to keep you comfortable during the procedure. This may mean changing the level of sedation.  The procedure will be performed. After the procedure  Your blood pressure, heart rate, breathing rate, and blood oxygen level will be monitored until the medicines you were given have worn off.  Do not drive for 24 hours if you received a sedative.  You may:  Feel sleepy, clumsy, or nauseous.  Feel forgetful about what happened after the procedure.  Have a sore throat if you had a breathing tube during the procedure.  Vomit. This information is not intended to replace advice given to you by your health care provider. Make sure you discuss any questions you have with your health care provider. Document Released: 06/22/2005 Document Revised: 03/04/2016 Document Reviewed: 01/17/2016 Elsevier Interactive Patient Education  2017 [place], Care After These instructions provide you with information about caring for yourself after your procedure. Your health care provider may also give you  more specific instructions. Your treatment has been planned according to current medical practices, but problems sometimes occur. Call your health care provider if you have any problems or questions after your procedure. What can I expect after the procedure? After your procedure, it is common to:  Feel sleepy for several hours.  Feel clumsy and have poor balance for several hours.  Feel forgetful about what happened after the procedure.  Have poor judgment for several hours.  Feel nauseous or vomit.  Have a sore throat if you had a breathing tube during the procedure. Follow these instructions at home: For at least 24 hours after the procedure:    Do not:  Participate in activities in which you could fall or become injured.  Drive.  Use heavy machinery.  Drink alcohol.  Take sleeping pills or medicines that cause drowsiness.  Make important decisions or sign legal documents.  Take care of children on your own.  Rest. Eating and drinking   Follow the diet that is recommended by your health care provider.  If you vomit, drink water, juice, or soup when you can drink without vomiting.  Make sure you have little or no nausea before eating solid foods. General instructions   Have a responsible adult stay with you until you are awake and alert.  Take over-the-counter and prescription medicines only as told by your health care provider.  If you smoke, do not smoke without supervision.  Keep all follow-up visits as told by your health care provider. This is important. Contact a health care provider if:  You keep feeling nauseous or you keep vomiting.  You feel light-headed.  You develop a rash.  You have a fever. Get help right away if:  You have trouble breathing. This information is not  intended to replace advice given to you by your health care provider. Make sure you discuss any questions you have with your health care provider. Document Released: 01/17/2016 Document Revised: 05/18/2016 Document Reviewed: 01/17/2016 Elsevier Interactive Patient Education  2017 [employer].

## 2017-02-23 ENCOUNTER — Encounter (HOSPITAL_COMMUNITY)
Admission: RE | Admit: 2017-02-23 | Discharge: 2017-02-23 | Disposition: A | Discharge status: Home / Self Care | Payer: Medicare Other | Source: Ambulatory Visit | Attending: Internal Medicine | Admitting: Internal Medicine

## 2017-02-23 ENCOUNTER — Encounter (HOSPITAL_COMMUNITY): Payer: Self-pay

## 2017-02-23 DIAGNOSIS — Z01812 Encounter for preprocedural laboratory examination: Secondary | ICD-10-CM | POA: Insufficient documentation

## 2017-02-23 DIAGNOSIS — R195 Other fecal abnormalities: Secondary | ICD-10-CM | POA: Diagnosis not present

## 2017-02-23 HISTORY — DX: Dorsalgia, unspecified: M54.9

## 2017-02-23 HISTORY — DX: Other chronic pain: G89.29

## 2017-02-23 HISTORY — DX: Spinal stenosis, site unspecified: M48.00

## 2017-02-23 HISTORY — DX: Polyneuropathy, unspecified: G62.9

## 2017-02-23 HISTORY — DX: Gastro-esophageal reflux disease without esophagitis: K21.9

## 2017-02-23 HISTORY — DX: Personal history of urinary calculi: Z87.442

## 2017-02-23 LAB — BASIC METABOLIC PANEL
Anion gap: 7 (ref 5–15)
BUN: 20 mg/dL (ref 6–20)
CALCIUM: 9.2 mg/dL (ref 8.9–10.3)
CO2: 28 mmol/L (ref 22–32)
Chloride: 104 mmol/L (ref 101–111)
Creatinine, Ser: 1.21 mg/dL (ref 0.61–1.24)
GFR calc Af Amer: >60 mL/min (ref >60)
GFR, EST NON AFRICAN AMERICAN: 58 mL/min — AB (ref >60)
GLUCOSE: 168 mg/dL — AB (ref 65–99)
Potassium: 4.3 mmol/L (ref 3.5–5.1)
Sodium: 139 mmol/L (ref 135–145)

## 2017-02-27 ENCOUNTER — Ambulatory Visit (HOSPITAL_COMMUNITY)
Admission: RE | Admit: 2017-02-27 | Discharge: 2017-02-27 | Disposition: A | Discharge status: Home / Self Care | Payer: Medicare Other | Source: Ambulatory Visit | Attending: Internal Medicine | Admitting: Internal Medicine

## 2017-02-27 ENCOUNTER — Encounter (INDEPENDENT_AMBULATORY_CARE_PROVIDER_SITE_OTHER): Payer: Self-pay | Admitting: *Deleted

## 2017-02-27 ENCOUNTER — Telehealth (INDEPENDENT_AMBULATORY_CARE_PROVIDER_SITE_OTHER): Payer: Self-pay | Admitting: *Deleted

## 2017-02-27 DIAGNOSIS — R195 Other fecal abnormalities: Secondary | ICD-10-CM | POA: Insufficient documentation

## 2017-02-27 MED ORDER — PEG 3350-KCL-NA BICARB-NACL 420 G PO SOLR: 4000.0000 mL | Freq: Once | ORAL | 0 refills | Status: AC

## 2017-02-27 NOTE — Telephone Encounter (Signed)
Patient needs trilyte 

## 2017-03-03 NOTE — Patient Instructions (Signed)
BUSTER SCHUELLER  03/03/2017     @PREFPERIOPPHARMACY @   Your procedure is scheduled on   03/13/2017   Report to [facility] at  615  A.M.  Call this number if you have problems the morning of surgery:  [phone]   Remember:  Do not eat food or drink liquids after midnight.  Take these medicines the morning of surgery with A SIP OF WATER  Neurontin, roxicodone, protonix, phenergan, flomax.   Do not wear jewelry, make-up or nail polish.  Do not wear lotions, powders, or perfumes, or deoderant.  Do not shave 48 hours prior to surgery.  Men may shave face and neck.  Do not bring valuables to the hospital.  [facility] is not responsible for any belongings or valuables.  Contacts, dentures or bridgework may not be worn into surgery.  Leave your suitcase in the car.  After surgery it may be brought to your room.  For patients admitted to the hospital, discharge time will be determined by your treatment team.  Patients discharged the day of surgery will not be allowed to drive home.   Name and phone number of your driver:   family Special instructions:  Follow the diet and prep instructions given to you by Dr Olevia Perches office.  Please read over the following fact sheets that you were given. Anesthesia Post-op Instructions and Care and Recovery After Surgery       Colonoscopy, Adult A colonoscopy is an exam to look at the entire large intestine. During the exam, a lubricated, bendable tube is inserted into the anus and then passed into the rectum, colon, and other parts of the large intestine. A colonoscopy is often done as a part of normal colorectal screening or in response to certain symptoms, such as anemia, persistent diarrhea, abdominal pain, and blood in the stool. The exam can help screen for and diagnose medical problems, including:  Tumors.  Polyps.  Inflammation.  Areas of bleeding. Tell a health care provider about:  Any allergies you  have.  All medicines you are taking, including vitamins, herbs, eye drops, creams, and over-the-counter medicines.  Any problems you or family members have had with anesthetic medicines.  Any blood disorders you have.  Any surgeries you have had.  Any medical conditions you have.  Any problems you have had passing stool. What are the risks? Generally, this is a safe procedure. However, problems may occur, including:  Bleeding.  A tear in the intestine.  A reaction to medicines given during the exam.  Infection (rare). What happens before the procedure? Eating and drinking restrictions  Follow instructions from your health care provider about eating and drinking, which may include:  A few days before the procedure - follow a low-fiber diet. Avoid nuts, seeds, dried fruit, raw fruits, and vegetables.  1-3 days before the procedure - follow a clear liquid diet. Drink only clear liquids, such as clear broth or bouillon, black coffee or tea, clear juice, clear soft drinks or sports drinks, gelatin dessert, and popsicles. Avoid any liquids that contain red or purple dye.  On the day of the procedure - do not eat or drink anything during the 2 hours before the procedure, or within the time period that your health care provider recommends. Bowel prep  If you were prescribed an oral bowel prep to clean out your colon:  Take it as told by your health care provider. Starting the day before  your procedure, you will need to drink a large amount of medicated liquid. The liquid will cause you to have multiple loose stools until your stool is almost clear or light green.  If your skin or anus gets irritated from diarrhea, you may use these to relieve the irritation:  Medicated wipes, such as adult wet wipes with aloe and vitamin E.  A skin soothing-product like petroleum jelly.  If you vomit while drinking the bowel prep, take a break for up to 60 minutes and then begin the bowel prep  again. If vomiting continues and you cannot take the bowel prep without vomiting, call your health care provider. General instructions   Ask your health care provider about changing or stopping your regular medicines. This is especially important if you are taking diabetes medicines or blood thinners.  Plan to have someone take you home from the hospital or clinic. What happens during the procedure?  An IV tube may be inserted into one of your veins.  You will be given medicine to help you relax (sedative).  To reduce your risk of infection:  Your health care team will wash or sanitize their hands.  Your anal area will be washed with soap.  You will be asked to lie on your side with your knees bent.  Your health care provider will lubricate a long, thin, flexible tube. The tube will have a camera and a light on the end.  The tube will be inserted into your anus.  The tube will be gently eased through your rectum and colon.  Air will be delivered into your colon to keep it open. You may feel some pressure or cramping.  The camera will be used to take images during the procedure.  A small tissue sample may be removed from your body to be examined under a microscope (biopsy). If any potential problems are found, the tissue will be sent to a lab for testing.  If small polyps are found, your health care provider may remove them and have them checked for cancer cells.  The tube that was inserted into your anus will be slowly removed. The procedure may vary among health care providers and hospitals. What happens after the procedure?  Your blood pressure, heart rate, breathing rate, and blood oxygen level will be monitored until the medicines you were given have worn off.  Do not drive for 24 hours after the exam.  You may have a small amount of blood in your stool.  You may pass gas and have mild abdominal cramping or bloating due to the air that was used to inflate your colon  during the exam.  It is up to you to get the results of your procedure. Ask your health care provider, or the department performing the procedure, when your results will be ready. This information is not intended to replace advice given to you by your health care provider. Make sure you discuss any questions you have with your health care provider. Document Released: 09/23/2000 Document Revised: 07/27/2016 Document Reviewed: 12/08/2015 Elsevier Interactive Patient Education  2017 [employer]  Colonoscopy, Adult, Care After This sheet gives you information about how to care for yourself after your procedure. Your health care provider may also give you more specific instructions. If you have problems or questions, contact your health care provider. What can I expect after the procedure? After the procedure, it is common to have:  A small amount of blood in your stool for 24 hours after the  procedure.  Some gas.  Mild abdominal cramping or bloating. Follow these instructions at home: General instructions    For the first 24 hours after the procedure:  Do not drive or use machinery.  Do not sign important documents.  Do not drink alcohol.  Do your regular daily activities at a slower pace than normal.  Eat soft, easy-to-digest foods.  Rest often.  Take over-the-counter or prescription medicines only as told by your health care provider.  It is up to you to get the results of your procedure. Ask your health care provider, or the department performing the procedure, when your results will be ready. Relieving cramping and bloating   Try walking around when you have cramps or feel bloated.  Apply heat to your abdomen as told by your health care provider. Use a heat source that your health care provider recommends, such as a moist heat pack or a heating pad.  Place a towel between your skin and the heat source.  Leave the heat on for 20-30 minutes.  Remove the heat if your  skin turns bright red. This is especially important if you are unable to feel pain, heat, or cold. You may have a greater risk of getting burned. Eating and drinking   Drink enough fluid to keep your urine clear or pale yellow.  Resume your normal diet as instructed by your health care provider. Avoid heavy or fried foods that are hard to digest.  Avoid drinking alcohol for as long as instructed by your health care provider. Contact a health care provider if:  You have blood in your stool 2-3 days after the procedure. Get help right away if:  You have more than a small spotting of blood in your stool.  You pass large blood clots in your stool.  Your abdomen is swollen.  You have nausea or vomiting.  You have a fever.  You have increasing abdominal pain that is not relieved with medicine. This information is not intended to replace advice given to you by your health care provider. Make sure you discuss any questions you have with your health care provider. Document Released: 05/10/2004 Document Revised: 06/20/2016 Document Reviewed: 12/08/2015 Elsevier Interactive Patient Education  2017 [place] Anesthesia is a term that refers to techniques, procedures, and medicines that help a person stay safe and comfortable during a medical procedure. Monitored anesthesia care, or sedation, is one type of anesthesia. Your anesthesia specialist may recommend sedation if you will be having a procedure that does not require you to be unconscious, such as:  Cataract surgery.  A dental procedure.  A biopsy.  A colonoscopy. During the procedure, you may receive a medicine to help you relax (sedative). There are three levels of sedation:  Mild sedation. At this level, you may feel awake and relaxed. You will be able to follow directions.  Moderate sedation. At this level, you will be sleepy. You may not remember the procedure.  Deep sedation. At this level,  you will be asleep. You will not remember the procedure. The more medicine you are given, the deeper your level of sedation will be. Depending on how you respond to the procedure, the anesthesia specialist may change your level of sedation or the type of anesthesia to fit your needs. An anesthesia specialist will monitor you closely during the procedure. Let your health care provider know about:  Any allergies you have.  All medicines you are taking, including vitamins, herbs, eye drops,  creams, and over-the-counter medicines.  Any use of steroids (by mouth or as a cream).  Any problems you or family members have had with sedatives and anesthetic medicines.  Any blood disorders you have.  Any surgeries you have had.  Any medical conditions you have, such as sleep apnea.  Whether you are pregnant or may be pregnant.  Any use of cigarettes, alcohol, or street drugs. What are the risks? Generally, this is a safe procedure. However, problems may occur, including:  Getting too much medicine (oversedation).  Nausea.  Allergic reaction to medicines.  Trouble breathing. If this happens, a breathing tube may be used to help with breathing. It will be removed when you are awake and breathing on your own.  Heart trouble.  Lung trouble. Before the procedure Staying hydrated  Follow instructions from your health care provider about hydration, which may include:  Up to 2 hours before the procedure - you may continue to drink clear liquids, such as water, clear fruit juice, black coffee, and plain tea. Eating and drinking restrictions  Follow instructions from your health care provider about eating and drinking, which may include:  8 hours before the procedure - stop eating heavy meals or foods such as meat, fried foods, or fatty foods.  6 hours before the procedure - stop eating light meals or foods, such as toast or cereal.  6 hours before the procedure - stop drinking milk or  drinks that contain milk.  2 hours before the procedure - stop drinking clear liquids. Medicines  Ask your health care provider about:  Changing or stopping your regular medicines. This is especially important if you are taking diabetes medicines or blood thinners.  Taking medicines such as aspirin and ibuprofen. These medicines can thin your blood. Do not take these medicines before your procedure if your health care provider instructs you not to. Tests and exams  You will have a physical exam.  You may have blood tests done to show:  How well your kidneys and liver are working.  How well your blood can clot.  General instructions  Plan to have someone take you home from the hospital or clinic.  If you will be going home right after the procedure, plan to have someone with you for 24 hours. What happens during the procedure?  Your blood pressure, heart rate, breathing, level of pain and overall condition will be monitored.  An IV tube will be inserted into one of your veins.  Your anesthesia specialist will give you medicines as needed to keep you comfortable during the procedure. This may mean changing the level of sedation.  The procedure will be performed. After the procedure  Your blood pressure, heart rate, breathing rate, and blood oxygen level will be monitored until the medicines you were given have worn off.  Do not drive for 24 hours if you received a sedative.  You may:  Feel sleepy, clumsy, or nauseous.  Feel forgetful about what happened after the procedure.  Have a sore throat if you had a breathing tube during the procedure.  Vomit. This information is not intended to replace advice given to you by your health care provider. Make sure you discuss any questions you have with your health care provider. Document Released: 06/22/2005 Document Revised: 03/04/2016 Document Reviewed: 01/17/2016 Elsevier Interactive Patient Education  2017 [place], Care After These instructions provide you with information about caring for yourself after your procedure. Your health care provider may also  give you more specific instructions. Your treatment has been planned according to current medical practices, but problems sometimes occur. Call your health care provider if you have any problems or questions after your procedure. What can I expect after the procedure? After your procedure, it is common to:  Feel sleepy for several hours.  Feel clumsy and have poor balance for several hours.  Feel forgetful about what happened after the procedure.  Have poor judgment for several hours.  Feel nauseous or vomit.  Have a sore throat if you had a breathing tube during the procedure. Follow these instructions at home: For at least 24 hours after the procedure:    Do not:  Participate in activities in which you could fall or become injured.  Drive.  Use heavy machinery.  Drink alcohol.  Take sleeping pills or medicines that cause drowsiness.  Make important decisions or sign legal documents.  Take care of children on your own.  Rest. Eating and drinking   Follow the diet that is recommended by your health care provider.  If you vomit, drink water, juice, or soup when you can drink without vomiting.  Make sure you have little or no nausea before eating solid foods. General instructions   Have a responsible adult stay with you until you are awake and alert.  Take over-the-counter and prescription medicines only as told by your health care provider.  If you smoke, do not smoke without supervision.  Keep all follow-up visits as told by your health care provider. This is important. Contact a health care provider if:  You keep feeling nauseous or you keep vomiting.  You feel light-headed.  You develop a rash.  You have a fever. Get help right away if:  You have trouble breathing. This  information is not intended to replace advice given to you by your health care provider. Make sure you discuss any questions you have with your health care provider. Document Released: 01/17/2016 Document Revised: 05/18/2016 Document Reviewed: 01/17/2016 Elsevier Interactive Patient Education  2017 [employer].

## 2017-03-08 ENCOUNTER — Inpatient Hospital Stay (HOSPITAL_COMMUNITY): Admit: 2017-03-08 | Discharge: 2017-03-08 | Disposition: A | Discharge status: Home / Self Care | Payer: Medicare Other

## 2017-03-08 ENCOUNTER — Encounter (HOSPITAL_COMMUNITY): Payer: Self-pay

## 2017-03-13 ENCOUNTER — Encounter (HOSPITAL_COMMUNITY): Admission: RE | Payer: Self-pay | Source: Ambulatory Visit

## 2017-03-13 ENCOUNTER — Ambulatory Visit (HOSPITAL_COMMUNITY): Admission: RE | Admit: 2017-03-13 | Payer: Medicare Other | Source: Ambulatory Visit | Admitting: Internal Medicine

## 2017-03-13 SURGERY — COLONOSCOPY WITH PROPOFOL
Anesthesia: Monitor Anesthesia Care

## 2018-11-15 ENCOUNTER — Other Ambulatory Visit (HOSPITAL_COMMUNITY): Payer: Self-pay | Admitting: Orthopedic Surgery

## 2018-11-15 DIAGNOSIS — T84030A Mechanical loosening of internal right hip prosthetic joint, initial encounter: Secondary | ICD-10-CM

## 2018-11-22 ENCOUNTER — Encounter (HOSPITAL_COMMUNITY)
Admission: RE | Admit: 2018-11-22 | Discharge: 2018-11-22 | Disposition: A | Discharge status: Home / Self Care | Payer: Medicare Other | Source: Ambulatory Visit | Attending: Orthopedic Surgery | Admitting: Orthopedic Surgery

## 2018-11-22 ENCOUNTER — Encounter (HOSPITAL_COMMUNITY): Payer: Self-pay

## 2018-11-22 DIAGNOSIS — T84030A Mechanical loosening of internal right hip prosthetic joint, initial encounter: Secondary | ICD-10-CM | POA: Diagnosis present

## 2018-11-22 MED ORDER — TECHNETIUM TC 99M MEDRONATE IV KIT
20.0000 | PACK | Freq: Once | INTRAVENOUS | Status: AC | PRN
  Administered 2018-11-22: 19.99 via INTRAVENOUS

## 2018-12-13 ENCOUNTER — Other Ambulatory Visit: Payer: Self-pay | Admitting: Orthopedic Surgery

## 2018-12-13 DIAGNOSIS — M7071 Other bursitis of hip, right hip: Secondary | ICD-10-CM

## 2018-12-14 ENCOUNTER — Ambulatory Visit: Payer: Medicare Other | Admitting: Urology

## 2018-12-14 DIAGNOSIS — N430 Encysted hydrocele: Secondary | ICD-10-CM

## 2018-12-19 ENCOUNTER — Other Ambulatory Visit: Payer: Self-pay

## 2018-12-19 ENCOUNTER — Ambulatory Visit
Admission: RE | Admit: 2018-12-19 | Discharge: 2018-12-19 | Disposition: A | Discharge status: Home / Self Care | Payer: Medicare Other | Source: Ambulatory Visit | Attending: Orthopedic Surgery | Admitting: Orthopedic Surgery

## 2018-12-19 DIAGNOSIS — M7071 Other bursitis of hip, right hip: Secondary | ICD-10-CM

## 2018-12-19 MED ORDER — IOPAMIDOL (ISOVUE-M 200) INJECTION 41%
1.0000 mL | Freq: Once | INTRAMUSCULAR | Status: AC
  Administered 2018-12-19: 1 mL via INTRA_ARTICULAR

## 2018-12-19 MED ORDER — METHYLPREDNISOLONE ACETATE 40 MG/ML INJ SUSP (RADIOLOG
120.0000 mg | Freq: Once | INTRAMUSCULAR | Status: AC
  Administered 2018-12-19: 120 mg via INTRA_ARTICULAR

## 2019-08-19 ENCOUNTER — Other Ambulatory Visit (HOSPITAL_COMMUNITY)
Admission: RE | Admit: 2019-08-19 | Discharge: 2019-08-19 | Disposition: A | Discharge status: Home / Self Care | Payer: Medicare Other | Source: Ambulatory Visit | Attending: Orthopedic Surgery | Admitting: Orthopedic Surgery

## 2019-08-19 ENCOUNTER — Ambulatory Visit: Payer: Self-pay | Admitting: Orthopedic Surgery

## 2019-08-19 DIAGNOSIS — Z20828 Contact with and (suspected) exposure to other viral communicable diseases: Secondary | ICD-10-CM | POA: Diagnosis not present

## 2019-08-19 DIAGNOSIS — Z01812 Encounter for preprocedural laboratory examination: Secondary | ICD-10-CM | POA: Insufficient documentation

## 2019-08-19 NOTE — H&P (Signed)
TOTAL HIP ADMISSION H&P  Patient is admitted for left total hip arthroplasty.  Subjective:  Chief Complaint: left hip pain  HPI: [name] [name], 73 y.o. 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  Onset of symptoms was gradual starting 2 years ago with rapidlly worsening course since that time.The patient noted no past surgery on the left hip(s).  Patient currently rates pain in the left hip at 10 out of 10 with activity. Patient has night pain, worsening of pain with activity and weight bearing, trendelenberg gait, pain that interfers with activities of daily living and pain with passive range of motion. Patient has evidence of subchondral sclerosis, joint space narrowing and AVN by imaging studies. This condition presents safety issues increasing the risk of falls.  There is no current active infection.  Patient Active Problem List   Diagnosis Date Noted  . Guaiac positive stools 02/21/2017  . Anemia 07/15/2015  . Leukocytosis 07/15/2015  . Sepsis ([place]) 07/14/2015  . Weakness 07/14/2015  . Essential hypertension 07/14/2015  . CAP (community acquired pneumonia) 07/12/2015  . Hypertension 02/16/2014  . Acute encephalopathy 02/16/2014  . Acute sinusitis 02/16/2014  . TIA (transient ischemic attack) 02/16/2014  . Acute kidney injury ([place]) 02/16/2014  . 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 Abscess of right hip 04/11/2012  . ALCOHOL ABUSE 05/24/2010  . SYNCOPE AND COLLAPSE 05/24/2010  . SHORTNESS OF BREATH 05/24/2010  . SNORING 05/24/2010   Past Medical History:  Diagnosis Date  . Abscess of right hip 04/11/2012  . 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  Chronic back pain   . Gastric ulcer   . GERD (gastroesophageal reflux disease)   . History of kidney stones   . Hypertension    lost weight and has not needed meds.  . Medial epicondylitis   . Nephrolithiasis   . Neuropathy   . Skin cancer    20 removed  . Spinal stenosis     Past Surgical History:  Procedure Laterality Date  . APPENDECTOMY    . I&D EXTREMITY  04/14/2012   Procedure: IRRIGATION AND DEBRIDEMENT EXTREMITY;  Surgeon: Johnny Bridge, MD;  Location: [facility];  Service: Orthopedics;  Laterality: Right;  I & D Hip  . TOTAL HIP ARTHROPLASTY Right 11/26/2012   Procedure: TOTAL HIP ARTHROPLASTY;  Surgeon: Johnny Bridge, MD;  Location: [facility];  Service: Orthopedics;  Laterality: Right;    Current Outpatient Medications  Medication Sig Dispense Refill Last Dose  . baclofen (LIORESAL) 10 MG tablet Take 10 mg by mouth at bedtime.     . bisacodyl (DULCOLAX) 5 MG EC tablet Take 5 mg by mouth daily.     . ergocalciferol (VITAMIN D2) 1.25 MG (50000 UT) capsule Take 50,000 Units by mouth every Tuesday.     Marland Kitchen omeprazole (PRILOSEC) 20 MG capsule Take 20 mg by mouth daily.     Marland Kitchen oxycodone (ROXICODONE) 30 MG immediate release tablet Take 1 tablet (30 mg total) by mouth every 6 (six) hours as needed for pain. (Patient taking differently: Take 30 mg by mouth every 4 (four) hours as needed for pain. )   Taking  . tamsulosin (FLOMAX) 0.4 MG CAPS capsule Take 0.4 mg by mouth at  bedtime.    Taking   No current facility-administered medications for this visit.    Allergies  Allergen Reactions  . Ambien [Zolpidem Tartrate]     hallucination  . Cyanocobalamin Nausea Only    Social History   Tobacco Use  . Smoking status: Never Smoker  . Smokeless tobacco: Never Used  Substance Use Topics  . Alcohol use: No    Comment: stopped 5 year ago    Family History  Problem Relation Age of Onset  . Heart disease Father      Review of Systems  Constitutional: Negative.   HENT: Negative.    Eyes: Negative.   Respiratory: Negative.   Cardiovascular: Negative.   Gastrointestinal: Negative.   Genitourinary: Negative.   Musculoskeletal: Positive for back pain, joint pain and myalgias.  Skin: Negative.   Neurological: Negative.   Endo/Heme/Allergies: Negative.   Psychiatric/Behavioral: Negative.     Objective:  Physical Exam  Vitals reviewed. Constitutional: He is oriented to person, place, and time. He appears well-developed and well-nourished.  HENT:  Head: Normocephalic and atraumatic.  Eyes: Conjunctivae and EOM are normal.  Neck: Normal range of motion. Neck supple.  Cardiovascular: Normal rate, regular rhythm and intact distal pulses.  Respiratory: Effort normal. No respiratory distress.  GI: Soft. He exhibits no distension.  Genitourinary:    Genitourinary Comments: deferred   Musculoskeletal:     Left hip: He exhibits decreased range of motion, decreased strength and bony tenderness.  Neurological: He is alert and oriented to person, place, and time. He has normal reflexes.  Skin: Skin is warm.  Psychiatric: He has a normal mood and affect. His behavior is normal. Judgment and thought content normal.    Vital signs in last 24 hours: @VSRANGES @  Labs:   Estimated body mass index is 31.78 kg/m as calculated from the following:   Height as of 02/23/17: 5\' 9"  (1.753 m).   Weight as of 02/23/17: 97.6 kg.   Imaging Review Plain radiographs demonstrate severe degenerative joint disease of the left hip(s). The bone quality appears to be adequate for age and reported activity level.      Assessment/Plan:  AVN, left hip(s)  The patient history, physical examination, clinical judgement of the provider and imaging studies are consistent with end stage degenerative joint disease of the left hip(s) and total hip arthroplasty is deemed medically necessary. The treatment options including medical management, injection therapy, arthroscopy and arthroplasty were  discussed at length. The risks and benefits of total hip arthroplasty were presented and reviewed. The risks due to aseptic loosening, infection, stiffness, dislocation/subluxation,  thromboembolic complications and other imponderables were discussed.  The patient acknowledged the explanation, agreed to proceed with the plan and consent was signed. Patient is being admitted for inpatient treatment for surgery, pain control, PT, OT, prophylactic antibiotics, VTE prophylaxis, progressive ambulation and ADL's and discharge planning.The patient is planning to be discharged home with HEP. Takes 30mg  oxycodone 5 times per day (Dilaudid equivalent 6-8 mg)   Anticipated LOS equal to or greater than 2 midnights due to - Age 48 and older with one or more of the following:  - Obesity  - Expected need for hospital services (PT, OT, Nursing) required for safe  discharge  - Anticipated need for postoperative skilled nursing care or inpatient rehab  - Active co-morbidities: Chronic pain requiring opiods OR   - Unanticipated findings during/Post Surgery: None  - Patient is a high risk of re-admission due to: None

## 2019-08-19 NOTE — H&P (View-Only) (Signed)
TOTAL HIP ADMISSION H&P  Patient is admitted for left total hip arthroplasty.  Subjective:  Chief Complaint: left hip pain  HPI: [name] [name], 73 y.o. 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  Onset of symptoms was gradual starting 2 years ago with rapidlly worsening course since that time.The patient noted no past surgery on the left hip(s).  Patient currently rates pain in the left hip at 10 out of 10 with activity. Patient has night pain, worsening of pain with activity and weight bearing, trendelenberg gait, pain that interfers with activities of daily living and pain with passive range of motion. Patient has evidence of subchondral sclerosis, joint space narrowing and AVN by imaging studies. This condition presents safety issues increasing the risk of falls.  There is no current active infection.  Patient Active Problem List   Diagnosis Date Noted  . Guaiac positive stools 02/21/2017  . Anemia 07/15/2015  . Leukocytosis 07/15/2015  . Sepsis ([place]) 07/14/2015  . Weakness 07/14/2015  . Essential hypertension 07/14/2015  . CAP (community acquired pneumonia) 07/12/2015  . Hypertension 02/16/2014  . Acute encephalopathy 02/16/2014  . Acute sinusitis 02/16/2014  . TIA (transient ischemic attack) 02/16/2014  . Acute kidney injury ([place]) 02/16/2014  . Avascular necrosis of right femoral head ([place]) 11/26/2012  . Abscess of right hip 04/11/2012  . ALCOHOL ABUSE 05/24/2010  . SYNCOPE AND COLLAPSE 05/24/2010  . SHORTNESS OF BREATH 05/24/2010  . SNORING 05/24/2010   Past Medical History:  Diagnosis Date  . Abscess of right hip 04/11/2012  . Avascular necrosis of right femoral head ([place]) 11/26/2012  .  Chronic back pain   . Gastric ulcer   . GERD (gastroesophageal reflux disease)   . History of kidney stones   . Hypertension    lost weight and has not needed meds.  . Medial epicondylitis   . Nephrolithiasis   . Neuropathy   . Skin cancer    20 removed  . Spinal stenosis     Past Surgical History:  Procedure Laterality Date  . APPENDECTOMY    . I&D EXTREMITY  04/14/2012   Procedure: IRRIGATION AND DEBRIDEMENT EXTREMITY;  Surgeon: Johnny Bridge, MD;  Location: [facility];  Service: Orthopedics;  Laterality: Right;  I & D Hip  . TOTAL HIP ARTHROPLASTY Right 11/26/2012   Procedure: TOTAL HIP ARTHROPLASTY;  Surgeon: Johnny Bridge, MD;  Location: [facility];  Service: Orthopedics;  Laterality: Right;    Current Outpatient Medications  Medication Sig Dispense Refill Last Dose  . baclofen (LIORESAL) 10 MG tablet Take 10 mg by mouth at bedtime.     . bisacodyl (DULCOLAX) 5 MG EC tablet Take 5 mg by mouth daily.     . ergocalciferol (VITAMIN D2) 1.25 MG (50000 UT) capsule Take 50,000 Units by mouth every Tuesday.     Marland Kitchen omeprazole (PRILOSEC) 20 MG capsule Take 20 mg by mouth daily.     Marland Kitchen oxycodone (ROXICODONE) 30 MG immediate release tablet Take 1 tablet (30 mg total) by mouth every 6 (six) hours as needed for pain. (Patient taking differently: Take 30 mg by mouth every 4 (four) hours as needed for pain. )   Taking  . tamsulosin (FLOMAX) 0.4 MG CAPS capsule Take 0.4 mg by mouth at  bedtime.    Taking   No current facility-administered medications for this visit.    Allergies  Allergen Reactions  . Ambien [Zolpidem Tartrate]     hallucination  . Cyanocobalamin Nausea Only    Social History   Tobacco Use  . Smoking status: Never Smoker  . Smokeless tobacco: Never Used  Substance Use Topics  . Alcohol use: No    Comment: stopped 5 year ago    Family History  Problem Relation Age of Onset  . Heart disease Father      Review of Systems  Constitutional: Negative.   HENT: Negative.    Eyes: Negative.   Respiratory: Negative.   Cardiovascular: Negative.   Gastrointestinal: Negative.   Genitourinary: Negative.   Musculoskeletal: Positive for back pain, joint pain and myalgias.  Skin: Negative.   Neurological: Negative.   Endo/Heme/Allergies: Negative.   Psychiatric/Behavioral: Negative.     Objective:  Physical Exam  Vitals reviewed. Constitutional: He is oriented to person, place, and time. He appears well-developed and well-nourished.  HENT:  Head: Normocephalic and atraumatic.  Eyes: Conjunctivae and EOM are normal.  Neck: Normal range of motion. Neck supple.  Cardiovascular: Normal rate, regular rhythm and intact distal pulses.  Respiratory: Effort normal. No respiratory distress.  GI: Soft. He exhibits no distension.  Genitourinary:    Genitourinary Comments: deferred   Musculoskeletal:     Left hip: He exhibits decreased range of motion, decreased strength and bony tenderness.  Neurological: He is alert and oriented to person, place, and time. He has normal reflexes.  Skin: Skin is warm.  Psychiatric: He has a normal mood and affect. His behavior is normal. Judgment and thought content normal.    Vital signs in last 24 hours: @VSRANGES @  Labs:   Estimated body mass index is 31.78 kg/m as calculated from the following:   Height as of 02/23/17: 5\' 9"  (1.753 m).   Weight as of 02/23/17: 97.6 kg.   Imaging Review Plain radiographs demonstrate severe degenerative joint disease of the left hip(s). The bone quality appears to be adequate for age and reported activity level.      Assessment/Plan:  AVN, left hip(s)  The patient history, physical examination, clinical judgement of the provider and imaging studies are consistent with end stage degenerative joint disease of the left hip(s) and total hip arthroplasty is deemed medically necessary. The treatment options including medical management, injection therapy, arthroscopy and arthroplasty were  discussed at length. The risks and benefits of total hip arthroplasty were presented and reviewed. The risks due to aseptic loosening, infection, stiffness, dislocation/subluxation,  thromboembolic complications and other imponderables were discussed.  The patient acknowledged the explanation, agreed to proceed with the plan and consent was signed. Patient is being admitted for inpatient treatment for surgery, pain control, PT, OT, prophylactic antibiotics, VTE prophylaxis, progressive ambulation and ADL's and discharge planning.The patient is planning to be discharged home with HEP. Takes 30mg  oxycodone 5 times per day (Dilaudid equivalent 6-8 mg)   Anticipated LOS equal to or greater than 2 midnights due to - Age 66 and older with one or more of the following:  - Obesity  - Expected need for hospital services (PT, OT, Nursing) required for safe  discharge  - Anticipated need for postoperative skilled nursing care or inpatient rehab  - Active co-morbidities: Chronic pain requiring opiods OR   - Unanticipated findings during/Post Surgery: None  - Patient is a high risk of re-admission due to: None

## 2019-08-20 ENCOUNTER — Encounter (HOSPITAL_COMMUNITY): Payer: Self-pay

## 2019-08-20 LAB — NOVEL CORONAVIRUS, NAA (HOSP ORDER, SEND-OUT TO REF LAB; TAT 18-24 HRS): SARS-CoV-2, NAA: NOT DETECTED

## 2019-08-20 NOTE — Patient Instructions (Addendum)
DUE TO COVID-19 ONLY ONE VISITOR IS ALLOWED TO COME WITH YOU AND STAY IN THE WAITING ROOM ONLY DURING PRE OP AND PROCEDURE. THE ONE VISITOR MAY VISIT WITH YOU IN YOUR PRIVATE ROOM DURING VISITING HOURS ONLY!!   COVID SWAB TESTING COMPLETED ON:  Monday, Nov. 9, 2020   (Must self quarantine after testing. Follow instructions on handout.)             Your procedure is scheduled on: Thursday, Nov. 12, 2020   Report to [facility] Main  Entrance    Report to admitting at 8:30 AM   Call this number if you have problems the morning of surgery [phone]   Do not eat food or drink liquids :After Midnight.   Brush your teeth the morning of surgery.   Do NOT smoke after Midnight   Take these medicines the morning of surgery with A SIP OF WATER: Omeprazole                               You may not have any metal on your body including jewelry, and body piercings             Do not wear lotions, powders, perfumes/cologne, or deodorant                         Men may shave face and neck.   Do not bring valuables to the hospital. [place].   Contacts, dentures or bridgework may not be worn into surgery.   Bring small overnight bag day of surgery.   Special Instructions: Bring a copy of your healthcare power of attorney and living will documents         the day of surgery if you haven't scanned them in before.              Please read over the following fact sheets you were given:  [facility] - Preparing for Surgery Before surgery, you can play an important role.  Because skin is not sterile, your skin needs to be as free of germs as possible.  You can reduce the number of germs on your skin by washing with CHG (chlorahexidine gluconate) soap before surgery.  CHG is an antiseptic cleaner which kills germs and bonds with the skin to continue killing germs even after washing. Please DO NOT use if you have an allergy to CHG or  antibacterial soaps.  If your skin becomes reddened/irritated stop using the CHG and inform your nurse when you arrive at Short Stay. Do not shave (including legs and underarms) for at least 48 hours prior to the first CHG shower.  You may shave your face/neck.  Please follow these instructions carefully:  1.  Shower with CHG Soap the night before surgery and the  morning of surgery.  2.  If you choose to wash your hair, wash your hair first as usual with your normal  shampoo.  3.  After you shampoo, rinse your hair and body thoroughly to remove the shampoo.                             4.  Use CHG as you would any other liquid soap.  You can apply chg directly to  the skin and wash.  Gently with a scrungie or clean washcloth.  5.  Apply the CHG Soap to your body ONLY FROM THE NECK DOWN.   Do   not use on face/ open                           Wound or open sores. Avoid contact with eyes, ears mouth and   genitals (private parts).                       Wash face,  Genitals (private parts) with your normal soap.             6.  Wash thoroughly, paying special attention to the area where your    surgery  will be performed.  7.  Thoroughly rinse your body with warm water from the neck down.  8.  DO NOT shower/wash with your normal soap after using and rinsing off the CHG Soap.                9.  Pat yourself dry with a clean towel.            10.  Wear clean pajamas.            11.  Place clean sheets on your bed the night of your first shower and do not  sleep with pets. Day of Surgery : Do not apply any lotions/deodorants the morning of surgery.  Please wear clean clothes to the hospital/surgery center.  FAILURE TO FOLLOW THESE INSTRUCTIONS MAY RESULT IN THE CANCELLATION OF YOUR SURGERY  PATIENT SIGNATURE_________________________________  NURSE SIGNATURE__________________________________  ________________________________________________________________________

## 2019-08-20 NOTE — Progress Notes (Signed)
Left a voicemail for [name] [name] at Dr. Sid Falcon office to request pre op orders.

## 2019-08-21 ENCOUNTER — Other Ambulatory Visit: Payer: Self-pay

## 2019-08-21 ENCOUNTER — Other Ambulatory Visit: Payer: Self-pay | Admitting: Orthopedic Surgery

## 2019-08-21 ENCOUNTER — Encounter (HOSPITAL_COMMUNITY)
Admission: RE | Admit: 2019-08-21 | Discharge: 2019-08-21 | Disposition: A | Discharge status: Home / Self Care | Payer: Medicare Other | Source: Ambulatory Visit | Attending: Orthopedic Surgery | Admitting: Orthopedic Surgery

## 2019-08-21 ENCOUNTER — Encounter (HOSPITAL_COMMUNITY): Payer: Self-pay

## 2019-08-21 DIAGNOSIS — G8929 Other chronic pain: Secondary | ICD-10-CM

## 2019-08-21 DIAGNOSIS — Z01812 Encounter for preprocedural laboratory examination: Secondary | ICD-10-CM | POA: Insufficient documentation

## 2019-08-21 HISTORY — DX: Unspecified osteoarthritis, unspecified site: M19.90

## 2019-08-21 HISTORY — DX: Depression, unspecified: F32.A

## 2019-08-21 HISTORY — DX: Hyperlipidemia, unspecified: E78.5

## 2019-08-21 HISTORY — DX: Personal history of other specified conditions: Z87.898

## 2019-08-21 HISTORY — DX: Personal history of other infectious and parasitic diseases: Z86.19

## 2019-08-21 HISTORY — DX: Personal history of diseases of the blood and blood-forming organs and certain disorders involving the immune mechanism: Z86.2

## 2019-08-21 LAB — CBC
HCT: 41.9 % (ref 39.0–52.0)
Hemoglobin: 13 g/dL (ref 13.0–17.0)
MCH: 31 pg (ref 26.0–34.0)
MCHC: 31 g/dL (ref 30.0–36.0)
MCV: 99.8 fL (ref 80.0–100.0)
Platelets: 128 10*3/uL — ABNORMAL LOW (ref 150–400)
RBC: 4.2 MIL/uL — ABNORMAL LOW (ref 4.22–5.81)
RDW: 12.6 % (ref 11.5–15.5)
WBC: 4.1 10*3/uL (ref 4.0–10.5)
nRBC: 0 % (ref 0.0–0.2)

## 2019-08-21 LAB — BASIC METABOLIC PANEL
Anion gap: 5 (ref 5–15)
BUN: 20 mg/dL (ref 8–23)
CO2: 28 mmol/L (ref 22–32)
Calcium: 8.8 mg/dL — ABNORMAL LOW (ref 8.9–10.3)
Chloride: 103 mmol/L (ref 98–111)
Creatinine, Ser: 0.97 mg/dL (ref 0.61–1.24)
GFR calc Af Amer: >60 mL/min (ref >60)
GFR calc non Af Amer: >60 mL/min (ref >60)
Glucose, Bld: 91 mg/dL (ref 70–99)
Potassium: 4.8 mmol/L (ref 3.5–5.1)
Sodium: 136 mmol/L (ref 135–145)

## 2019-08-21 LAB — SURGICAL PCR SCREEN
MRSA, PCR: NEGATIVE
Staphylococcus aureus: POSITIVE — AB

## 2019-08-21 NOTE — Pre-Procedure Instructions (Addendum)
PCP - Sharlyne Cai NP Cardiologist - [place] Chest x-ray -   EKG -Received from [facility] Stress Test -  ECHO -  Cardiac Cath -   Sleep Study -  CPAP -   Fasting Blood Sugar -  Checks Blood Sugar _____ times a day  Blood Thinner Instructions: Aspirin Instructions: Last Dose:  Anesthesia review:   Patient denies shortness of breath, fever, cough and chest pain at PAT appointment   Patient verbalized understanding of instructions that were given to them at the PAT appointment. Patient was also instructed that they will need to review over the PAT instructions again at home before surgery.

## 2019-08-22 ENCOUNTER — Encounter (HOSPITAL_COMMUNITY)
Admission: RE | Disposition: A | Discharge status: Home / Self Care | Payer: Self-pay | Source: Home / Self Care | Attending: Orthopedic Surgery

## 2019-08-22 ENCOUNTER — Ambulatory Visit (HOSPITAL_COMMUNITY): Payer: Medicare Other

## 2019-08-22 ENCOUNTER — Ambulatory Visit (HOSPITAL_COMMUNITY): Payer: Medicare Other | Admitting: Registered Nurse

## 2019-08-22 ENCOUNTER — Observation Stay (HOSPITAL_COMMUNITY)
Admission: RE | Admit: 2019-08-22 | Discharge: 2019-08-23 | Disposition: A | Discharge status: Home / Self Care | Payer: Medicare Other | Attending: Orthopedic Surgery | Admitting: Orthopedic Surgery

## 2019-08-22 ENCOUNTER — Ambulatory Visit (HOSPITAL_COMMUNITY): Payer: Medicare Other | Admitting: Physician Assistant

## 2019-08-22 ENCOUNTER — Encounter (HOSPITAL_COMMUNITY): Payer: Self-pay | Admitting: *Deleted

## 2019-08-22 ENCOUNTER — Observation Stay (HOSPITAL_COMMUNITY): Payer: Medicare Other

## 2019-08-22 DIAGNOSIS — Z96641 Presence of right artificial hip joint: Secondary | ICD-10-CM | POA: Insufficient documentation

## 2019-08-22 DIAGNOSIS — Z8673 Personal history of transient ischemic attack (TIA), and cerebral infarction without residual deficits: Secondary | ICD-10-CM | POA: Insufficient documentation

## 2019-08-22 DIAGNOSIS — I2510 Atherosclerotic heart disease of native coronary artery without angina pectoris: Secondary | ICD-10-CM | POA: Diagnosis not present

## 2019-08-22 DIAGNOSIS — Z09 Encounter for follow-up examination after completed treatment for conditions other than malignant neoplasm: Secondary | ICD-10-CM

## 2019-08-22 DIAGNOSIS — K219 Gastro-esophageal reflux disease without esophagitis: Secondary | ICD-10-CM | POA: Insufficient documentation

## 2019-08-22 DIAGNOSIS — I10 Essential (primary) hypertension: Secondary | ICD-10-CM | POA: Insufficient documentation

## 2019-08-22 DIAGNOSIS — M25752 Osteophyte, left hip: Secondary | ICD-10-CM | POA: Diagnosis not present

## 2019-08-22 DIAGNOSIS — Z85828 Personal history of other malignant neoplasm of skin: Secondary | ICD-10-CM | POA: Insufficient documentation

## 2019-08-22 DIAGNOSIS — M87852 Other osteonecrosis, left femur: Principal | ICD-10-CM | POA: Insufficient documentation

## 2019-08-22 DIAGNOSIS — G8929 Other chronic pain: Secondary | ICD-10-CM | POA: Insufficient documentation

## 2019-08-22 DIAGNOSIS — Z419 Encounter for procedure for purposes other than remedying health state, unspecified: Secondary | ICD-10-CM

## 2019-08-22 DIAGNOSIS — Z79899 Other long term (current) drug therapy: Secondary | ICD-10-CM | POA: Insufficient documentation

## 2019-08-22 DIAGNOSIS — M25552 Pain in left hip: Secondary | ICD-10-CM | POA: Diagnosis present

## 2019-08-22 DIAGNOSIS — M87052 Idiopathic aseptic necrosis of left femur: Secondary | ICD-10-CM

## 2019-08-22 HISTORY — PX: TOTAL HIP ARTHROPLASTY: SHX124

## 2019-08-22 LAB — COMPREHENSIVE METABOLIC PANEL
ALT: 69 U/L — ABNORMAL HIGH (ref 0–44)
AST: 115 U/L — ABNORMAL HIGH (ref 15–41)
Albumin: 3.5 g/dL (ref 3.5–5.0)
Alkaline Phosphatase: 154 U/L — ABNORMAL HIGH (ref 38–126)
Anion gap: 5 (ref 5–15)
BUN: 19 mg/dL (ref 8–23)
CO2: 29 mmol/L (ref 22–32)
Calcium: 8.8 mg/dL — ABNORMAL LOW (ref 8.9–10.3)
Chloride: 104 mmol/L (ref 98–111)
Creatinine, Ser: 0.92 mg/dL (ref 0.61–1.24)
GFR calc Af Amer: >60 mL/min (ref >60)
GFR calc non Af Amer: >60 mL/min (ref >60)
Glucose, Bld: 141 mg/dL — ABNORMAL HIGH (ref 70–99)
Potassium: 5.1 mmol/L (ref 3.5–5.1)
Sodium: 138 mmol/L (ref 135–145)
Total Bilirubin: 1.6 mg/dL — ABNORMAL HIGH (ref 0.3–1.2)
Total Protein: 6 g/dL — ABNORMAL LOW (ref 6.5–8.1)

## 2019-08-22 LAB — CBC
HCT: 38.8 % — ABNORMAL LOW (ref 39.0–52.0)
Hemoglobin: 12.2 g/dL — ABNORMAL LOW (ref 13.0–17.0)
MCH: 31 pg (ref 26.0–34.0)
MCHC: 31.4 g/dL (ref 30.0–36.0)
MCV: 98.7 fL (ref 80.0–100.0)
Platelets: 124 10*3/uL — ABNORMAL LOW (ref 150–400)
RBC: 3.93 MIL/uL — ABNORMAL LOW (ref 4.22–5.81)
RDW: 12.5 % (ref 11.5–15.5)
WBC: 5.5 10*3/uL (ref 4.0–10.5)
nRBC: 0 % (ref 0.0–0.2)

## 2019-08-22 LAB — ABO/RH: ABO/RH(D): A POS

## 2019-08-22 LAB — TYPE AND SCREEN
ABO/RH(D): A POS
Antibody Screen: NEGATIVE

## 2019-08-22 LAB — PROTIME-INR
INR: 1.1 (ref 0.8–1.2)
Prothrombin Time: 13.8 seconds (ref 11.4–15.2)

## 2019-08-22 SURGERY — ARTHROPLASTY, HIP, TOTAL, ANTERIOR APPROACH
Anesthesia: Spinal | Site: Hip | Laterality: Left

## 2019-08-22 MED ORDER — ALUM & MAG HYDROXIDE-SIMETH 200-200-20 MG/5ML PO SUSP: 30.0000 mL | ORAL | Status: DC | PRN

## 2019-08-22 MED ORDER — FENTANYL CITRATE (PF) 100 MCG/2ML IJ SOLN
25.0000 ug | INTRAMUSCULAR | Status: DC | PRN
  Administered 2019-08-22 (×2): 50 ug via INTRAVENOUS

## 2019-08-22 MED ORDER — CHLORHEXIDINE GLUCONATE 4 % EX LIQD: 60.0000 mL | Freq: Once | CUTANEOUS | Status: DC

## 2019-08-22 MED ORDER — METOCLOPRAMIDE HCL 5 MG/ML IJ SOLN: 5.0000 mg | Freq: Three times a day (TID) | INTRAMUSCULAR | Status: DC | PRN

## 2019-08-22 MED ORDER — ONDANSETRON HCL 4 MG PO TABS: 4.0000 mg | ORAL_TABLET | Freq: Four times a day (QID) | ORAL | Status: DC | PRN

## 2019-08-22 MED ORDER — METHOCARBAMOL 500 MG PO TABS: 500.0000 mg | ORAL_TABLET | Freq: Four times a day (QID) | ORAL | Status: DC | PRN

## 2019-08-22 MED ORDER — DIPHENHYDRAMINE HCL 12.5 MG/5ML PO ELIX: 12.5000 mg | ORAL_SOLUTION | ORAL | Status: DC | PRN

## 2019-08-22 MED ORDER — PROPOFOL 10 MG/ML IV BOLUS
INTRAVENOUS | Status: AC
  Filled 2019-08-22: qty 20

## 2019-08-22 MED ORDER — CEFAZOLIN SODIUM-DEXTROSE 2-4 GM/100ML-% IV SOLN
2.0000 g | Freq: Four times a day (QID) | INTRAVENOUS | Status: AC
  Administered 2019-08-22 (×2): 2 g via INTRAVENOUS
  Filled 2019-08-22 (×2): qty 100

## 2019-08-22 MED ORDER — SODIUM CHLORIDE 0.9 % IV SOLN
INTRAVENOUS | Status: DC
  Administered 2019-08-22: 17:00:00 via INTRAVENOUS

## 2019-08-22 MED ORDER — FENTANYL CITRATE (PF) 100 MCG/2ML IJ SOLN
INTRAMUSCULAR | Status: AC
  Filled 2019-08-22: qty 2

## 2019-08-22 MED ORDER — TAMSULOSIN HCL 0.4 MG PO CAPS
0.4000 mg | ORAL_CAPSULE | Freq: Every day | ORAL | Status: DC
  Administered 2019-08-22: 0.4 mg via ORAL
  Filled 2019-08-22: qty 1

## 2019-08-22 MED ORDER — SODIUM CHLORIDE 0.9 % IR SOLN
Status: DC | PRN
  Administered 2019-08-22: 3000 mL

## 2019-08-22 MED ORDER — MENTHOL 3 MG MT LOZG: 1.0000 | LOZENGE | OROMUCOSAL | Status: DC | PRN

## 2019-08-22 MED ORDER — CELECOXIB 200 MG PO CAPS
200.0000 mg | ORAL_CAPSULE | Freq: Two times a day (BID) | ORAL | Status: DC
  Administered 2019-08-22 – 2019-08-23 (×2): 200 mg via ORAL
  Filled 2019-08-22 (×2): qty 1

## 2019-08-22 MED ORDER — BUPIVACAINE-EPINEPHRINE 0.25% -1:200000 IJ SOLN
INTRAMUSCULAR | Status: AC
  Filled 2019-08-22: qty 1

## 2019-08-22 MED ORDER — WATER FOR IRRIGATION, STERILE IR SOLN
Status: DC | PRN
  Administered 2019-08-22: 2000 mL

## 2019-08-22 MED ORDER — DOCUSATE SODIUM 100 MG PO CAPS
100.0000 mg | ORAL_CAPSULE | Freq: Two times a day (BID) | ORAL | Status: DC
  Administered 2019-08-22 – 2019-08-23 (×2): 100 mg via ORAL
  Filled 2019-08-22 (×2): qty 1

## 2019-08-22 MED ORDER — PANTOPRAZOLE SODIUM 40 MG PO TBEC
40.0000 mg | DELAYED_RELEASE_TABLET | Freq: Every day | ORAL | Status: DC
  Administered 2019-08-23: 40 mg via ORAL
  Filled 2019-08-22: qty 1

## 2019-08-22 MED ORDER — PHENOL 1.4 % MT LIQD: 1.0000 | OROMUCOSAL | Status: DC | PRN

## 2019-08-22 MED ORDER — BUPIVACAINE-EPINEPHRINE 0.25% -1:200000 IJ SOLN
INTRAMUSCULAR | Status: DC | PRN
  Administered 2019-08-22: 30 mL

## 2019-08-22 MED ORDER — SODIUM CHLORIDE 0.9 % IR SOLN
Status: DC | PRN
  Administered 2019-08-22: 1000 mL

## 2019-08-22 MED ORDER — KETOROLAC TROMETHAMINE 30 MG/ML IJ SOLN
INTRAMUSCULAR | Status: DC | PRN
  Administered 2019-08-22: 30 mg via INTRA_ARTICULAR

## 2019-08-22 MED ORDER — HYDROMORPHONE HCL 2 MG PO TABS
6.0000 mg | ORAL_TABLET | ORAL | Status: DC | PRN
  Administered 2019-08-22 – 2019-08-23 (×5): 6 mg via ORAL
  Filled 2019-08-22 (×5): qty 3

## 2019-08-22 MED ORDER — TRANEXAMIC ACID-NACL 1000-0.7 MG/100ML-% IV SOLN
1000.0000 mg | INTRAVENOUS | Status: AC
  Administered 2019-08-22: 12:00:00 1000 mg via INTRAVENOUS
  Filled 2019-08-22: qty 100

## 2019-08-22 MED ORDER — POLYETHYLENE GLYCOL 3350 17 G PO PACK: 17.0000 g | PACK | Freq: Every day | ORAL | Status: DC | PRN

## 2019-08-22 MED ORDER — FENTANYL CITRATE (PF) 100 MCG/2ML IJ SOLN
INTRAMUSCULAR | Status: DC | PRN
  Administered 2019-08-22 (×2): 50 ug via INTRAVENOUS

## 2019-08-22 MED ORDER — LACTATED RINGERS IV SOLN
INTRAVENOUS | Status: DC
  Administered 2019-08-22: 09:00:00 via INTRAVENOUS

## 2019-08-22 MED ORDER — DEXAMETHASONE SODIUM PHOSPHATE 10 MG/ML IJ SOLN
INTRAMUSCULAR | Status: AC
  Filled 2019-08-22: qty 1

## 2019-08-22 MED ORDER — METHOCARBAMOL 500 MG IVPB - SIMPLE MED
INTRAVENOUS | Status: AC
  Filled 2019-08-22: qty 50

## 2019-08-22 MED ORDER — POVIDONE-IODINE 10 % EX SWAB
2.0000 "application " | Freq: Once | CUTANEOUS | Status: AC
  Administered 2019-08-22: 2 via TOPICAL

## 2019-08-22 MED ORDER — ONDANSETRON HCL 4 MG/2ML IJ SOLN
INTRAMUSCULAR | Status: AC
  Filled 2019-08-22: qty 2

## 2019-08-22 MED ORDER — BISACODYL 5 MG PO TBEC
5.0000 mg | DELAYED_RELEASE_TABLET | Freq: Every day | ORAL | Status: DC
  Administered 2019-08-23: 5 mg via ORAL
  Filled 2019-08-22: qty 1

## 2019-08-22 MED ORDER — METOCLOPRAMIDE HCL 5 MG PO TABS: 5.0000 mg | ORAL_TABLET | Freq: Three times a day (TID) | ORAL | Status: DC | PRN

## 2019-08-22 MED ORDER — SODIUM CHLORIDE (PF) 0.9 % IJ SOLN
INTRAMUSCULAR | Status: DC | PRN
  Administered 2019-08-22: 30 mL

## 2019-08-22 MED ORDER — KETOROLAC TROMETHAMINE 30 MG/ML IJ SOLN
INTRAMUSCULAR | Status: AC
  Filled 2019-08-22: qty 1

## 2019-08-22 MED ORDER — SODIUM CHLORIDE (PF) 0.9 % IJ SOLN
INTRAMUSCULAR | Status: AC
  Filled 2019-08-22: qty 50

## 2019-08-22 MED ORDER — SENNA 8.6 MG PO TABS
1.0000 | ORAL_TABLET | Freq: Two times a day (BID) | ORAL | Status: DC
  Administered 2019-08-22 – 2019-08-23 (×2): 8.6 mg via ORAL
  Filled 2019-08-22 (×2): qty 1

## 2019-08-22 MED ORDER — SODIUM CHLORIDE 0.9 % IV SOLN: INTRAVENOUS | Status: DC

## 2019-08-22 MED ORDER — METHOCARBAMOL 500 MG IVPB - SIMPLE MED
500.0000 mg | Freq: Four times a day (QID) | INTRAVENOUS | Status: DC | PRN
  Administered 2019-08-22: 500 mg via INTRAVENOUS
  Filled 2019-08-22: qty 50

## 2019-08-22 MED ORDER — BACLOFEN 10 MG PO TABS
10.0000 mg | ORAL_TABLET | Freq: Every day | ORAL | Status: DC
  Administered 2019-08-22: 10 mg via ORAL
  Filled 2019-08-22: qty 1

## 2019-08-22 MED ORDER — ACETAMINOPHEN 10 MG/ML IV SOLN
1000.0000 mg | INTRAVENOUS | Status: AC
  Administered 2019-08-22: 1000 mg via INTRAVENOUS
  Filled 2019-08-22: qty 100

## 2019-08-22 MED ORDER — MIDAZOLAM HCL 5 MG/5ML IJ SOLN
INTRAMUSCULAR | Status: DC | PRN
  Administered 2019-08-22: 2 mg via INTRAVENOUS

## 2019-08-22 MED ORDER — EPHEDRINE 5 MG/ML INJ
INTRAVENOUS | Status: AC
  Filled 2019-08-22: qty 10

## 2019-08-22 MED ORDER — DEXAMETHASONE SODIUM PHOSPHATE 10 MG/ML IJ SOLN
INTRAMUSCULAR | Status: DC | PRN
  Administered 2019-08-22: 8 mg via INTRAVENOUS

## 2019-08-22 MED ORDER — PROPOFOL 500 MG/50ML IV EMUL
INTRAVENOUS | Status: DC | PRN
  Administered 2019-08-22: 50 ug/kg/min via INTRAVENOUS

## 2019-08-22 MED ORDER — EPHEDRINE SULFATE-NACL 50-0.9 MG/10ML-% IV SOSY
PREFILLED_SYRINGE | INTRAVENOUS | Status: DC | PRN
  Administered 2019-08-22: 5 mg via INTRAVENOUS

## 2019-08-22 MED ORDER — ONDANSETRON HCL 4 MG/2ML IJ SOLN: 4.0000 mg | Freq: Four times a day (QID) | INTRAMUSCULAR | Status: DC | PRN

## 2019-08-22 MED ORDER — BUPIVACAINE IN DEXTROSE 0.75-8.25 % IT SOLN
INTRATHECAL | Status: DC | PRN
  Administered 2019-08-22: 2 mL via INTRATHECAL

## 2019-08-22 MED ORDER — ACETAMINOPHEN 325 MG PO TABS: 325.0000 mg | ORAL_TABLET | Freq: Four times a day (QID) | ORAL | Status: DC | PRN

## 2019-08-22 MED ORDER — HYDROMORPHONE HCL 1 MG/ML IJ SOLN: 1.0000 mg | INTRAMUSCULAR | Status: DC | PRN

## 2019-08-22 MED ORDER — DEXAMETHASONE SODIUM PHOSPHATE 10 MG/ML IJ SOLN
10.0000 mg | Freq: Once | INTRAMUSCULAR | Status: AC
  Administered 2019-08-23: 10:00:00 10 mg via INTRAVENOUS
  Filled 2019-08-22: qty 1

## 2019-08-22 MED ORDER — MIDAZOLAM HCL 2 MG/2ML IJ SOLN
INTRAMUSCULAR | Status: AC
  Filled 2019-08-22: qty 2

## 2019-08-22 MED ORDER — 0.9 % SODIUM CHLORIDE (POUR BTL) OPTIME
TOPICAL | Status: DC | PRN
  Administered 2019-08-22: 1000 mL

## 2019-08-22 MED ORDER — PROPOFOL 500 MG/50ML IV EMUL
INTRAVENOUS | Status: AC
  Filled 2019-08-22: qty 50

## 2019-08-22 MED ORDER — ASPIRIN 81 MG PO CHEW
81.0000 mg | CHEWABLE_TABLET | Freq: Two times a day (BID) | ORAL | Status: DC
  Administered 2019-08-22 – 2019-08-23 (×2): 81 mg via ORAL
  Filled 2019-08-22 (×2): qty 1

## 2019-08-22 MED ORDER — CEFAZOLIN SODIUM-DEXTROSE 2-4 GM/100ML-% IV SOLN
2.0000 g | INTRAVENOUS | Status: AC
  Administered 2019-08-22: 2 g via INTRAVENOUS
  Filled 2019-08-22: qty 100

## 2019-08-22 MED ORDER — ONDANSETRON HCL 4 MG/2ML IJ SOLN
INTRAMUSCULAR | Status: DC | PRN
  Administered 2019-08-22: 4 mg via INTRAVENOUS

## 2019-08-22 MED ORDER — ERGOCALCIFEROL 1.25 MG (50000 UT) PO CAPS: 50000.0000 [IU] | ORAL_CAPSULE | ORAL | Status: DC

## 2019-08-22 MED ORDER — POVIDONE-IODINE 10 % EX SWAB: 2.0000 "application " | Freq: Once | CUTANEOUS | Status: DC

## 2019-08-22 SURGICAL SUPPLY — 54 items
BAG ZIPLOCK 12X15 (MISCELLANEOUS) ×3 IMPLANT
BLADE SURG SZ10 CARB STEEL (BLADE) ×3 IMPLANT
CHLORAPREP W/TINT 26 (MISCELLANEOUS) ×3 IMPLANT
COVER PERINEAL POST (MISCELLANEOUS) ×3 IMPLANT
COVER SURGICAL LIGHT HANDLE (MISCELLANEOUS) ×3 IMPLANT
COVER WAND RF STERILE (DRAPES) IMPLANT
CUP SECTOR GRIPTON 58MM (Orthopedic Implant) ×3 IMPLANT
DECANTER SPIKE VIAL GLASS SM (MISCELLANEOUS) ×6 IMPLANT
DERMABOND ADVANCED (GAUZE/BANDAGES/DRESSINGS) ×4
DERMABOND ADVANCED .7 DNX12 (GAUZE/BANDAGES/DRESSINGS) ×2 IMPLANT
DRAPE IMP U-DRAPE 54X76 (DRAPES) ×3 IMPLANT
DRAPE SHEET LG 3/4 BI-LAMINATE (DRAPES) ×9 IMPLANT
DRAPE STERI IOBAN 125X83 (DRAPES) IMPLANT
DRAPE U-SHAPE 47X51 STRL (DRAPES) ×6 IMPLANT
DRSG AQUACEL AG ADV 3.5X10 (GAUZE/BANDAGES/DRESSINGS) ×3 IMPLANT
ELECT PENCIL ROCKER SW 15FT (MISCELLANEOUS) ×3 IMPLANT
ELECT REM PT RETURN 15FT ADLT (MISCELLANEOUS) ×3 IMPLANT
GAUZE SPONGE 4X4 12PLY STRL (GAUZE/BANDAGES/DRESSINGS) ×3 IMPLANT
GLOVE BIO SURGEON STRL SZ8.5 (GLOVE) ×6 IMPLANT
GLOVE BIOGEL PI IND STRL 8.5 (GLOVE) ×1 IMPLANT
GLOVE BIOGEL PI INDICATOR 8.5 (GLOVE) ×2
GOWN SPEC L3 XXLG W/TWL (GOWN DISPOSABLE) ×3 IMPLANT
HANDPIECE INTERPULSE COAX TIP (DISPOSABLE) ×2
HEAD CERAMIC 36 PLUS5 (Hips) ×3 IMPLANT
HOLDER FOLEY CATH W/STRAP (MISCELLANEOUS) ×3 IMPLANT
HOOD PEEL AWAY FLYTE STAYCOOL (MISCELLANEOUS) ×12 IMPLANT
JET LAVAGE IRRISEPT WOUND (IRRIGATION / IRRIGATOR) ×3
KIT TURNOVER KIT A (KITS) IMPLANT
LAVAGE JET IRRISEPT WOUND (IRRIGATION / IRRIGATOR) ×1 IMPLANT
LINER NEUTRAL 52X36X58N (Liner) ×3 IMPLANT
MANIFOLD NEPTUNE II (INSTRUMENTS) ×3 IMPLANT
MARKER SKIN DUAL TIP RULER LAB (MISCELLANEOUS) ×3 IMPLANT
NDL SAFETY ECLIPSE 18X1.5 (NEEDLE) ×1 IMPLANT
NEEDLE HYPO 18GX1.5 SHARP (NEEDLE) ×2
NEEDLE SPNL 18GX3.5 QUINCKE PK (NEEDLE) ×3 IMPLANT
PACK ANTERIOR HIP CUSTOM (KITS) ×3 IMPLANT
SAW OSC TIP CART 19.5X105X1.3 (SAW) ×3 IMPLANT
SEALER BIPOLAR AQUA 6.0 (INSTRUMENTS) ×3 IMPLANT
SET HNDPC FAN SPRY TIP SCT (DISPOSABLE) ×1 IMPLANT
STEM TRI LOC BPS GRIP SZ9 OFFS ×1 IMPLANT
SUT ETHIBOND NAB CT1 #1 30IN (SUTURE) ×9 IMPLANT
SUT MNCRL AB 3-0 PS2 18 (SUTURE) ×3 IMPLANT
SUT MNCRL AB 4-0 PS2 18 (SUTURE) ×3 IMPLANT
SUT MON AB 2-0 CT1 36 (SUTURE) ×6 IMPLANT
SUT STRATAFIX PDO 1 14 VIOLET (SUTURE) ×2
SUT STRATFX PDO 1 14 VIOLET (SUTURE) ×1
SUT VIC AB 2-0 CT1 27 (SUTURE) ×2
SUT VIC AB 2-0 CT1 TAPERPNT 27 (SUTURE) ×1 IMPLANT
SUTURE STRATFX PDO 1 14 VIOLET (SUTURE) ×1 IMPLANT
SYR 3ML LL SCALE MARK (SYRINGE) ×3 IMPLANT
TRAY FOLEY MTR SLVR 16FR STAT (SET/KITS/TRAYS/PACK) ×3 IMPLANT
TRI LOC BPS W/GRIP SZ 9 OFFS ×3 IMPLANT
WATER STERILE IRR 1000ML POUR (IV SOLUTION) ×3 IMPLANT
YANKAUER SUCT BULB TIP 10FT TU (MISCELLANEOUS) ×6 IMPLANT

## 2019-08-22 NOTE — Anesthesia Procedure Notes (Signed)
Spinal  Patient location during procedure: OR Start time: 08/22/2019 11:36 AM End time: 08/22/2019 11:46 AM Staffing Anesthesiologist: Freddrick March, MD Performed: anesthesiologist  Preanesthetic Checklist Completed: patient identified, surgical consent, pre-op evaluation, timeout performed, IV checked, risks and benefits discussed and monitors and equipment checked Spinal Block Patient position: sitting Prep: site prepped and draped and DuraPrep Patient monitoring: cardiac monitor, continuous pulse ox and blood pressure Approach: midline Location: L3-4 Injection technique: single-shot Needle Needle type: Pencan  Needle gauge: 24 G Needle length: 9 cm Assessment Sensory level: T6 Additional Notes Functioning IV was confirmed and monitors were applied. Sterile prep and drape, including hand hygiene and sterile gloves were used. The patient was positioned and the spine was prepped. The skin was anesthetized with lidocaine.  Free flow of clear CSF was obtained prior to injecting local anesthetic into the CSF.  The spinal needle aspirated freely following injection.  The needle was carefully withdrawn.  The patient tolerated the procedure well.

## 2019-08-22 NOTE — Op Note (Signed)
OPERATIVE REPORT  SURGEON: Rod Can, MD   ASSISTANT: Nehemiah Massed, PA-C.  PREOPERATIVE DIAGNOSIS: Left hip avascular necrosis.   POSTOPERATIVE DIAGNOSIS: Left hip avascular necrosis.   PROCEDURE: Left total hip arthroplasty, anterior approach.   IMPLANTS: DePuy Tri Lock stem, size 9, hi offset. DePuy Pinnacle Cup, size 58 mm. DePuy Altrx liner, size 36 by 58 mm, neutral. DePuy Biolox ceramic head ball, size 36 + 5 mm.  ANESTHESIA:  MAC and Spinal  ESTIMATED BLOOD LOSS:-450 mL    ANTIBIOTICS: 2 g Ancef.  DRAINS: None.  COMPLICATIONS: None.   CONDITION: PACU - hemodynamically stable.   BRIEF CLINICAL NOTE: [name] [name] is a 73 y.o. male with a long-standing history of Left hip avascular necrosis. After failing conservative management, the patient was indicated for total hip arthroplasty. The risks, benefits, and alternatives to the procedure were explained, and the patient elected to proceed.  PROCEDURE IN DETAIL: Surgical site was marked by myself in the pre-op holding area. Once inside the operating room, spinal anesthesia was obtained, and a foley catheter was inserted. The patient was then positioned on the Hana table.  All bony prominences were well padded.  The hip was prepped and draped in the normal sterile surgical fashion.  A time-out was called verifying side and site of surgery. The patient received IV antibiotics within 60 minutes of beginning the procedure.   The direct anterior approach to the hip was performed through the Hueter interval.  Lateral femoral circumflex vessels were treated with the Auqumantys. The anterior capsule was exposed and an inverted T capsulotomy was made. The femoral neck cut was made to the level of the templated cut.  A corkscrew was placed into the head and the head was removed.  The femoral head was found to have eburnated bone. The head was passed to the back table and was measured.   Acetabular exposure was achieved,  and the pulvinar and labrum were excised. Sequential reaming of the acetabulum was then performed up to a size 57 mm reamer. A 58 mm cup was then opened and impacted into place at approximately 40 degrees of abduction and 20 degrees of anteversion. The final polyethylene liner was impacted into place and acetabular osteophytes were removed.    I then gained femoral exposure taking care to protect the abductors and greater trochanter.  This was performed using standard external rotation, extension, and adduction.  The capsule was peeled off the inner aspect of the greater trochanter, taking care to preserve the short external rotators. A cookie cutter was used to enter the femoral canal, and then the femoral canal finder was placed.  Sequential broaching was performed up to a size 9.  Reaming of the proximal femur was required to achieve proper fit, as the broach was initially potting distally. Calcar planer was used on the femoral neck remnant.  I placed a hi offset neck and a trial head ball.  The hip was reduced.  Leg lengths and offset were checked fluoroscopically.  The hip was dislocated and trial components were removed.  The final implants were placed, and the hip was reduced.  Fluoroscopy was used to confirm component position and leg lengths.  At 90 degrees of external rotation and full extension, the hip was stable to an anterior directed force.   The wound was copiously irrigated with Irrisept solution and normal saline using pule lavage.  Marcaine solution was injected into the periarticular soft tissue.  The wound was closed in layers using #1  Stratafix for the fascia, 2-0 Vicryl for the subcutaneous fat, 2-0 Monocryl for the deep dermal layer, 3-0 running Monocryl subcuticular stitch, and Dermabond for the skin.  Once the glue was fully dried, an Aquacell Ag dressing was applied.  The patient was transported to the recovery room in stable condition.  Sponge, needle, and instrument counts were  correct at the end of the case x2.  The patient tolerated the procedure well and there were no known complications.  Please note that a surgical assistant was a medical necessity for this procedure to perform it in a safe and expeditious manner. Assistant was necessary to provide appropriate retraction of vital neurovascular structures, to prevent femoral fracture, and to allow for anatomic placement of the prosthesis.

## 2019-08-22 NOTE — Anesthesia Preprocedure Evaluation (Addendum)
Anesthesia Evaluation  Patient identified by MRN, date of birth, ID band Patient awake    Reviewed: Allergy & Precautions, NPO status , Patient's Chart, lab work & pertinent test results  Airway Mallampati: III  TM Distance: >3 FB Neck ROM: Full  Mouth opening: Limited Mouth Opening  Dental  (+) Edentulous Upper, Edentulous Lower   Pulmonary neg pulmonary ROS,    Pulmonary exam normal breath sounds clear to auscultation       Cardiovascular hypertension, + CAD  Normal cardiovascular exam Rhythm:Regular Rate:Normal  TTE 2008 Normal LVEF, valves ok   Neuro/Psych PSYCHIATRIC DISORDERS Depression TIA   GI/Hepatic PUD, GERD  Medicated and Controlled,(+)     substance abuse  alcohol use and cocaine use,   Endo/Other  negative endocrine ROS  Renal/GU negative Renal ROS  negative genitourinary   Musculoskeletal  (+) Arthritis ,   Abdominal   Peds  Hematology negative hematology ROS (+)   Anesthesia Other Findings   Reproductive/Obstetrics                            Anesthesia Physical Anesthesia Plan  ASA: III  Anesthesia Plan: Spinal   Post-op Pain Management:    Induction:   PONV Risk Score and Plan: Treatment may vary due to age or medical condition and Propofol infusion  Airway Management Planned: Natural Airway  Additional Equipment:   Intra-op Plan:   Post-operative Plan:   Informed Consent: I have reviewed the patients History and Physical, chart, labs and discussed the procedure including the risks, benefits and alternatives for the proposed anesthesia with the patient or authorized representative who has indicated his/her understanding and acceptance.     Dental advisory given  Plan Discussed with: CRNA  Anesthesia Plan Comments:         Anesthesia Quick Evaluation

## 2019-08-22 NOTE — Evaluation (Signed)
Physical Therapy Evaluation Patient Details Name: [name] [name] MRN: [id number] DOB: 1946/04/02 Today's Date: 08/22/2019   History of Present Illness  Patient is 73 y.o. male s/p Lt THA anterior approach with PMH significant for HTN, alcohol abuse, back pain, HLD, CAD, GERD, OA, and Rt THA in 2014.    Clinical Impression  JONETHAN VELTRE is a 73 y.o. male POD 0 s/p Lt THA anterior approach. Patient reports independence with mobility at baseline. Patient is now limited by functional impairments (see PT problem list below) and requires min assist for transfers and gait with RW. Patient was able to ambulate ~60 feet with RW and assist to steady and maintain safe proximity to RW. Patient instructed in exercise to facilitate ROM and circulation. Patient will benefit from continued skilled PT interventions to address impairments and progress towards PLOF. Acute PT will follow to progress mobility and stair training in preparation for safe discharge home.    Follow Up Recommendations Follow surgeon's recommendation for DC plan and follow-up therapies    Equipment Recommendations  Rolling walker with 5" wheels    Recommendations for Other Services       Precautions / Restrictions Precautions Precautions: Fall Restrictions Weight Bearing Restrictions: No      Mobility  Bed Mobility Overal bed mobility: Needs Assistance Bed Mobility: Supine to Sit     Supine to sit: Min assist;HOB elevated     General bed mobility comments: verbal/tactile cues for sequencing of Rt LE to assist with Lt LE mobility, assist required due to difficulty, pt using rails to raise trunk  Transfers Overall transfer level: Needs assistance Equipment used: Rolling walker (2 wheeled) Transfers: Sit to/from Stand Sit to Stand: Min assist         General transfer comment: cues for safe hand placement and technique with RW, assist requried to initiate power up and complete  rise  Ambulation/Gait Ambulation/Gait assistance: Min assist Gait Distance (Feet): 60 Feet Assistive device: Rolling walker (2 wheeled) Gait Pattern/deviations: Step-through pattern;Narrow base of support;Scissoring;Decreased stride length;Decreased step length - left;Decreased step length - right Gait velocity: decreased   General Gait Details: pt wtih some scissoring step and NBOS throughout, verbal cues and assist required intermittently to maintain safe proximity to [employer]    Modified Rankin (Stroke Patients Only)       Balance Overall balance assessment: Needs assistance Sitting-balance support: No upper extremity supported;Feet supported Sitting balance-Leahy Scale: Good     Standing balance support: During functional activity;Bilateral upper extremity supported Standing balance-Leahy Scale: Fair              Pertinent Vitals/Pain Pain Assessment: 0-10 Pain Score: 6  Pain Location: Rt hip Pain Descriptors / Indicators: Aching;Sore Pain Intervention(s): Limited activity within patient's tolerance;Monitored during session;Repositioned    Home Living Family/patient expects to be discharged to:: Private residence Living Arrangements: Spouse/significant other Available Help at Discharge: Family;Available 24 hours/day Type of Home: House Home Access: Stairs to enter Entrance Stairs-Rails: None Entrance Stairs-Number of Steps: 2 steps (platform) first one has rail, second does not Home Layout: Two level;Laundry or work area in basement;Bed/bath upstairs;Able to live on main level with bedroom/bathroom;Full bath on main level Home Equipment: Walker - 4 wheels;Cane - single point      Prior Function Level of Independence: Independent               Hand Dominance   Dominant Hand: Right  Extremity/Trunk Assessment   Upper Extremity Assessment Upper Extremity Assessment: Overall WFL for tasks assessed    Lower  Extremity Assessment Lower Extremity Assessment: Overall WFL for tasks assessed;LLE deficits/detail;RLE deficits/detail RLE Deficits / Details: pt reports numbness in plantar surface of feet RLE Sensation: history of peripheral neuropathy LLE Deficits / Details: good quad activation in supine, 4/5 for quad strength with MMT, pt reports numbness in plantar surface of feet LLE Sensation: history of peripheral neuropathy    Cervical / Trunk Assessment Cervical / Trunk Assessment: Normal  Communication   Communication: No difficulties  Cognition Arousal/Alertness: Awake/alert Behavior During Therapy: WFL for tasks assessed/performed Overall Cognitive Status: Within Functional Limits for tasks assessed         General Comments      Exercises Total Joint Exercises Ankle Circles/Pumps: AROM;15 reps;Seated;Both Quad Sets: AROM;Left;5 reps;Supine Heel Slides: AAROM;10 reps;Seated;Left   Assessment/Plan    PT Assessment Patient needs continued PT services  PT Problem List Decreased strength;Decreased balance;Decreased mobility;Decreased range of motion;Impaired sensation;Decreased activity tolerance;Decreased knowledge of use of DME       PT Treatment Interventions DME instruction;Functional mobility training;Balance training;Patient/family education;Modalities;Gait training;Therapeutic activities;Therapeutic exercise;Stair training    PT Goals (Current goals can be found in the Care Plan section)  Acute Rehab PT Goals Patient Stated Goal: to get home PT Goal Formulation: With patient Time For Goal Achievement: 08/29/19 Potential to Achieve Goals: Good    Frequency 7X/week    AM-PAC PT "6 Clicks" Mobility  Outcome Measure Help needed turning from your back to your side while in a flat bed without using bedrails?: A Little Help needed moving from lying on your back to sitting on the side of a flat bed without using bedrails?: A Little Help needed moving to and from a bed to a  chair (including a wheelchair)?: A Little Help needed standing up from a chair using your arms (e.g., wheelchair or bedside chair)?: A Little Help needed to walk in hospital room?: A Little Help needed climbing 3-5 steps with a railing? : A Little 6 Click Score: 18    End of Session Equipment Utilized During Treatment: Gait belt Activity Tolerance: Patient tolerated treatment well Patient left: with call bell/phone within reach;in chair;with chair alarm set;with nursing/sitter in room Nurse Communication: Mobility status PT Visit Diagnosis: Muscle weakness (generalized) (M62.81);Difficulty in walking, not elsewhere classified (R26.2);Unsteadiness on feet (R26.81)    Time: [id number] PT Time Calculation (min) (ACUTE ONLY): 25 min   Charges:   PT Evaluation $PT Eval Low Complexity: 1 Low PT Treatments $Therapeutic Exercise: 8-22 mins        Kipp Brood, PT, DPT Physical Therapist with [facility]  08/22/2019 6:14 PM

## 2019-08-22 NOTE — Interval H&P Note (Signed)
History and Physical Interval Note:  08/22/2019 10:01 AM  [name] [name]  has presented today for surgery, with the diagnosis of Avascular necrosis left hip with collapse.  The various methods of treatment have been discussed with the patient and family. After consideration of risks, benefits and other options for treatment, the patient has consented to  Procedure(s): TOTAL HIP ARTHROPLASTY ANTERIOR APPROACH (Left) as a surgical intervention.  The patient's history has been reviewed, patient examined, no change in status, stable for surgery.  I have reviewed the patient's chart and labs.  Questions were answered to the patient's satisfaction.     Hilton Cork Tierre Netto

## 2019-08-22 NOTE — Addendum Note (Signed)
Addendum  created 08/22/19 1601 by Freddrick March, MD   Attestation recorded in [place], [place] filed

## 2019-08-22 NOTE — Discharge Instructions (Signed)
°Dr. Idalis Hoelting °Joint Replacement Specialist °[facility] °[address] °[place], [place] [address] °[phone] ° ° °TOTAL HIP REPLACEMENT POSTOPERATIVE DIRECTIONS ° ° ° °Hip Rehabilitation, Guidelines Following Surgery  ° °WEIGHT BEARING °Weight bearing as tolerated with assist device (walker, cane, etc) as directed, use it as long as suggested by your surgeon or therapist, typically at least 4-6 weeks. ° °The results of a hip operation are greatly improved after range of motion and muscle strengthening exercises. Follow all safety measures which are given to protect your hip. If any of these exercises cause increased pain or swelling in your joint, decrease the amount until you are comfortable again. Then slowly increase the exercises. Call your caregiver if you have problems or questions.  ° °HOME CARE INSTRUCTIONS  °Most of the following instructions are designed to prevent the dislocation of your new hip.  °Remove items at home which could result in a fall. This includes throw rugs or furniture in walking pathways.  °Continue medications as instructed at time of discharge. °· You may have some home medications which will be placed on hold until you complete the course of blood thinner medication. °· You may start showering once you are discharged home. Do not remove your dressing. °Do not put on socks or shoes without following the instructions of your caregivers.   °Sit on chairs with arms. Use the chair arms to help push yourself up when arising.  °Arrange for the use of a toilet seat elevator so you are not sitting low.  °· Walk with walker as instructed.  °You may resume a sexual relationship in one month or when given the OK by your caregiver.  °Use walker as long as suggested by your caregivers.  °You may put full weight on your legs and walk as much as is comfortable. °Avoid periods of inactivity such as sitting longer than an hour when not asleep. This helps prevent  blood clots.  °You may return to work once you are cleared by your surgeon.  °Do not drive a car for 6 weeks or until released by your surgeon.  °Do not drive while taking narcotics.  °Wear elastic stockings for two weeks following surgery during the day but you may remove then at night.  °Make sure you keep all of your appointments after your operation with all of your doctors and caregivers. You should call the office at the above phone number and make an appointment for approximately two weeks after the date of your surgery. °Please pick up a stool softener and laxative for home use as long as you are requiring pain medications. °· ICE to the affected hip every three hours for 30 minutes at a time and then as needed for pain and swelling. Continue to use ice on the hip for pain and swelling from surgery. You may notice swelling that will progress down to the foot and ankle.  This is normal after surgery.  Elevate the leg when you are not up walking on it.   °It is important for you to complete the blood thinner medication as prescribed by your doctor. °· Continue to use the breathing machine which will help keep your temperature down.  It is common for your temperature to cycle up and down following surgery, especially at night when you are not up moving around and exerting yourself.  The breathing machine keeps your lungs expanded and your temperature down. ° °RANGE OF MOTION AND STRENGTHENING EXERCISES  °These exercises are   designed to help you keep full movement of your hip joint. Follow your caregiver's or physical therapist's instructions. Perform all exercises about fifteen times, three times per day or as directed. Exercise both hips, even if you have had only one joint replacement. These exercises can be done on a training (exercise) mat, on the floor, on a table or on a bed. Use whatever works the best and is most comfortable for you. Use music or television while you are exercising so that the exercises  are a pleasant break in your day. This will make your life better with the exercises acting as a break in routine you can look forward to.  °Lying on your back, slowly slide your foot toward your buttocks, raising your knee up off the floor. Then slowly slide your foot back down until your leg is straight again.  °Lying on your back spread your legs as far apart as you can without causing discomfort.  °Lying on your side, raise your upper leg and foot straight up from the floor as far as is comfortable. Slowly lower the leg and repeat.  °Lying on your back, tighten up the muscle in the front of your thigh (quadriceps muscles). You can do this by keeping your leg straight and trying to raise your heel off the floor. This helps strengthen the largest muscle supporting your knee.  °Lying on your back, tighten up the muscles of your buttocks both with the legs straight and with the knee bent at a comfortable angle while keeping your heel on the floor.  ° °SKILLED REHAB INSTRUCTIONS: °If the patient is transferred to a skilled rehab facility following release from the hospital, a list of the current medications will be sent to the facility for the patient to continue.  When discharged from the skilled rehab facility, please have the facility set up the patient's Home Health Physical Therapy prior to being released. Also, the skilled facility will be responsible for providing the patient with their medications at time of release from the facility to include their pain medication and their blood thinner medication. If the patient is still at the rehab facility at time of the two week follow up appointment, the skilled rehab facility will also need to assist the patient in arranging follow up appointment in our office and any transportation needs. ° °MAKE SURE YOU:  °Understand these instructions.  °Will watch your condition.  °Will get help right away if you are not doing well or get worse. ° °Pick up stool softner and  laxative for home use following surgery while on pain medications. °Do not remove your dressing. °The dressing is waterproof--it is OK to take showers. °Continue to use ice for pain and swelling after surgery. °Do not use any lotions or creams on the incision until instructed by your surgeon. °Total Hip Protocol. ° ° °

## 2019-08-22 NOTE — Anesthesia Postprocedure Evaluation (Signed)
Anesthesia Post Note  Patient: ACELIN TUFFORD  Procedure(s) Performed: TOTAL HIP ARTHROPLASTY ANTERIOR APPROACH (Left Hip)     Patient location during evaluation: PACU Anesthesia Type: Spinal Level of consciousness: oriented and awake and alert Pain management: pain level controlled Vital Signs Assessment: post-procedure vital signs reviewed and stable Respiratory status: spontaneous breathing, respiratory function stable and patient connected to nasal cannula oxygen Cardiovascular status: blood pressure returned to baseline and stable Postop Assessment: no headache, no backache and no apparent nausea or vomiting Anesthetic complications: no    Last Vitals:  Vitals:   08/22/19 1530 08/22/19 1551  BP: 121/66 121/72  Pulse: (!) 56 (!) 53  Resp: 10 14  Temp: 36.6 C   SpO2: 95%     Last Pain:  Vitals:   08/22/19 1530  TempSrc:   PainSc: 3                  Draylon Mercadel L Revin Corker

## 2019-08-22 NOTE — Transfer of Care (Signed)
Immediate Anesthesia Transfer of Care Note  Patient: [name] [name]  Procedure(s) Performed: TOTAL HIP ARTHROPLASTY ANTERIOR APPROACH (Left Hip)  Patient Location: PACU  Anesthesia Type:MAC and Spinal  Level of Consciousness: awake, alert , oriented and patient cooperative  Airway & Oxygen Therapy: Patient Spontanous Breathing and Patient connected to face mask oxygen  Post-op Assessment: Report given to RN and Post -op Vital signs reviewed and stable  Post vital signs: Reviewed and stable  Last Vitals:  Vitals Value Taken Time  BP 121/70 08/22/19 1423  Temp 36.7 C 08/22/19 1423  Pulse 67 08/22/19 1426  Resp 14 08/22/19 1426  SpO2 98 % 08/22/19 1426  Vitals shown include unvalidated device data.  Last Pain:  Vitals:   08/22/19 0912  TempSrc: Oral         Complications: No apparent anesthesia complications

## 2019-08-23 ENCOUNTER — Encounter (HOSPITAL_COMMUNITY): Payer: Self-pay | Admitting: Orthopedic Surgery

## 2019-08-23 DIAGNOSIS — M87852 Other osteonecrosis, left femur: Secondary | ICD-10-CM | POA: Diagnosis not present

## 2019-08-23 LAB — CBC
HCT: 32 % — ABNORMAL LOW (ref 39.0–52.0)
Hemoglobin: 10.3 g/dL — ABNORMAL LOW (ref 13.0–17.0)
MCH: 31.3 pg (ref 26.0–34.0)
MCHC: 32.2 g/dL (ref 30.0–36.0)
MCV: 97.3 fL (ref 80.0–100.0)
Platelets: 115 10*3/uL — ABNORMAL LOW (ref 150–400)
RBC: 3.29 MIL/uL — ABNORMAL LOW (ref 4.22–5.81)
RDW: 12.2 % (ref 11.5–15.5)
WBC: 11.1 10*3/uL — ABNORMAL HIGH (ref 4.0–10.5)
nRBC: 0 % (ref 0.0–0.2)

## 2019-08-23 LAB — URINALYSIS, ROUTINE W REFLEX MICROSCOPIC
Bacteria, UA: NONE SEEN
Bilirubin Urine: NEGATIVE
Glucose, UA: NEGATIVE mg/dL
Ketones, ur: NEGATIVE mg/dL
Nitrite: NEGATIVE
Protein, ur: NEGATIVE mg/dL
Specific Gravity, Urine: 1.009 (ref 1.005–1.030)
pH: 6 (ref 5.0–8.0)

## 2019-08-23 LAB — BASIC METABOLIC PANEL
Anion gap: 8 (ref 5–15)
BUN: 18 mg/dL (ref 8–23)
CO2: 24 mmol/L (ref 22–32)
Calcium: 8.1 mg/dL — ABNORMAL LOW (ref 8.9–10.3)
Chloride: 102 mmol/L (ref 98–111)
Creatinine, Ser: 1.04 mg/dL (ref 0.61–1.24)
GFR calc Af Amer: >60 mL/min (ref >60)
GFR calc non Af Amer: >60 mL/min (ref >60)
Glucose, Bld: 109 mg/dL — ABNORMAL HIGH (ref 70–99)
Potassium: 4.3 mmol/L (ref 3.5–5.1)
Sodium: 134 mmol/L — ABNORMAL LOW (ref 135–145)

## 2019-08-23 MED ORDER — ONDANSETRON HCL 4 MG PO TABS: 4.0000 mg | ORAL_TABLET | Freq: Four times a day (QID) | ORAL | 0 refills | Status: DC | PRN

## 2019-08-23 MED ORDER — SENNA 8.6 MG PO TABS: 2.0000 | ORAL_TABLET | Freq: Every day | ORAL | 0 refills | Status: AC

## 2019-08-23 MED ORDER — DOCUSATE SODIUM 100 MG PO CAPS: 100.0000 mg | ORAL_CAPSULE | Freq: Two times a day (BID) | ORAL | 0 refills | Status: AC

## 2019-08-23 MED ORDER — ASPIRIN 81 MG PO CHEW: 81.0000 mg | CHEWABLE_TABLET | Freq: Two times a day (BID) | ORAL | 1 refills | Status: AC

## 2019-08-23 MED ORDER — HYDROMORPHONE HCL 2 MG PO TABS: 6.0000 mg | ORAL_TABLET | ORAL | 0 refills | Status: DC | PRN

## 2019-08-23 MED ORDER — CELECOXIB 200 MG PO CAPS: 200.0000 mg | ORAL_CAPSULE | Freq: Every day | ORAL | 0 refills | Status: DC

## 2019-08-23 NOTE — TOC Transition Note (Signed)
Transition of Care [facility]) - CM/SW Discharge Note   Patient Details  Name: [name] [name] MRN: [id number] Date of Birth: 12/04/1945  [facility] CM/SW Contact:  Lia Hopping, LCSW Phone Number: 08/23/2019, 10:34 AM   Clinical Narrative:    Therapy Plan: HEP RW ordered and delivered through [place]    Final next level of care: Home/Self Care(HEP) Barriers to Discharge: No Barriers Identified   Patient Goals and CMS Choice     Choice offered to / list presented to : NA  Discharge Placement  Home                      Discharge Plan and Services                DME Arranged: Walker rolling DME Agency: [employer] Date DME Agency Contacted: 08/23/19 Time DME Agency Contacted: 0900 Representative spoke with at DME Agency: [place] ([place]) Interventions     Readmission Risk Interventions No flowsheet data found.

## 2019-08-23 NOTE — Progress Notes (Signed)
Pt was provided with d/c instructions. After discussing the pt's plan of care, the pt reported no further questions or concerns.  

## 2019-08-23 NOTE — Progress Notes (Signed)
    Subjective:  Patient reports pain as mild to moderate.  Denies N/V/CP/SOB.   Objective:   VITALS:   Vitals:   08/22/19 2136 08/23/19 0051 08/23/19 0432 08/23/19 0913  BP: 135/83 119/74 (!) 114/57 115/61  Pulse: 81 72 69 83  Resp: 14 14 16 16   Temp: 97.6 F (36.4 C) 97.9 F (36.6 C) 97.8 F (36.6 C) 98.1 F (36.7 C)  TempSrc: Oral Oral Oral Oral  SpO2: 96% 94% 95% 93%  Weight:      Height:        NAD ABD soft Sensation intact distally Intact pulses distally Dorsiflexion/Plantar flexion intact Incision: dressing C/D/I Compartment soft   Lab Results  Component Value Date   WBC 11.1 (H) 08/23/2019   HGB 10.3 (L) 08/23/2019   HCT 32.0 (L) 08/23/2019   MCV 97.3 08/23/2019   PLT 115 (L) 08/23/2019   BMET    Component Value Date/Time   NA 134 (L) 08/23/2019 0320   K 4.3 08/23/2019 0320   CL 102 08/23/2019 0320   CO2 24 08/23/2019 0320   GLUCOSE 109 (H) 08/23/2019 0320   BUN 18 08/23/2019 0320   CREATININE 1.04 08/23/2019 0320   CALCIUM 8.1 (L) 08/23/2019 0320   GFRNONAA >60 08/23/2019 0320   GFRAA >60 08/23/2019 0320     Assessment/Plan: 1 Day Post-Op   Principal Problem:   Avascular necrosis of bone of left hip (HCC) Active Problems:   Avascular necrosis of hip, left ([place])   WBAT with walker DVT ppx: Aspirin, SCDs, TEDS PO pain control PT/OT Dispo: D/C home with HEP   Hilton Cork Lourdes Kucharski 08/23/2019, 10:08 AM   Rod Can, MD [phone] [place] is now [facility]  [place] [address] [address], [place], [place] [address] Phone: [phone] [email or web address]  [employer]

## 2019-08-23 NOTE — Discharge Summary (Signed)
Physician Discharge Summary  Patient ID: [name] [name] MRN: [id number] DOB/AGE: 74/04/1946 74 y.o.  Admit date: 08/22/2019 Discharge date: 08/23/2019  Admission Diagnoses:  Avascular necrosis of bone of left hip [facility])  Discharge Diagnoses:  Principal Problem:   Avascular necrosis of bone of left hip ([place]) Active Problems:   Avascular necrosis of hip, left [facility])   Past Medical History:  Diagnosis Date  . Abscess of right hip 04/11/2012  . Arthritis    back  . Avascular necrosis of right femoral head ([place]) 11/26/2012  . Chronic back pain   . Coronary artery disease 2009   Single-vessel coronary artery disease nonobstructive noted on cardiac cath   . Depression   . Gastric ulcer   . GERD (gastroesophageal reflux disease)   . History of anemia   . History of crack cocaine use   . History of kidney stones   . History of staph infection    after hip replacement  . Hyperlipidemia   . Hypertension    lost weight and has not needed meds.  . Medial epicondylitis   . Nephrolithiasis   . Neuropathy   . Pneumonia 2017  . RBBB 07/19/2016   noted on EKG  . Skin cancer    20 removed  . Spinal stenosis     Surgeries: Procedure(s): TOTAL HIP ARTHROPLASTY ANTERIOR APPROACH on 08/22/2019   Consultants (if any):   Discharged Condition: Improved  Hospital Course: [name] [name] is an 73 y.o. male who was admitted 08/22/2019 with a diagnosis of Avascular necrosis of bone of left hip ([place]) and went to the operating room on 08/22/2019 and underwent the above named procedures.    He was given perioperative antibiotics:  Anti-infectives (From admission, onward)   Start     Dose/Rate Route Frequency Ordered Stop   08/22/19 1800  ceFAZolin (ANCEF) IVPB 2g/100 mL premix     2 g 200 mL/hr over 30 Minutes Intravenous Every 6 hours 08/22/19 1558 08/23/19 0003   08/22/19 0845  ceFAZolin (ANCEF) IVPB 2g/100 mL premix     2 g 200 mL/hr over 30 Minutes Intravenous On call to O.R.  08/22/19 [id number] 08/22/19 1217    .  He was given sequential compression devices, early ambulation, and ASA for DVT prophylaxis.  He benefited maximally from the hospital stay and there were no complications.    Recent vital signs:  Vitals:   08/23/19 0432 08/23/19 0913  BP: (!) 114/57 115/61  Pulse: 69 83  Resp: 16 16  Temp: 97.8 F (36.6 C) 98.1 F (36.7 C)  SpO2: 95% 93%    Recent laboratory studies:  Lab Results  Component Value Date   HGB 10.3 (L) 08/23/2019   HGB 12.2 (L) 08/22/2019   HGB 13.0 08/21/2019   Lab Results  Component Value Date   WBC 11.1 (H) 08/23/2019   PLT 115 (L) 08/23/2019   Lab Results  Component Value Date   INR 1.1 08/22/2019   Lab Results  Component Value Date   NA 134 (L) 08/23/2019   K 4.3 08/23/2019   CL 102 08/23/2019   CO2 24 08/23/2019   BUN 18 08/23/2019   CREATININE 1.04 08/23/2019   GLUCOSE 109 (H) 08/23/2019    Discharge Medications:   Allergies as of 08/23/2019      Reactions   Ambien [zolpidem Tartrate]    hallucination   Cyanocobalamin Nausea Only      Medication List    STOP taking these medications   oxycodone  30 MG immediate release tablet Commonly known as: ROXICODONE     TAKE these medications   aspirin 81 MG chewable tablet Chew 1 tablet (81 mg total) by mouth 2 (two) times daily.   baclofen 10 MG tablet Commonly known as: LIORESAL Take 10 mg by mouth at bedtime.   bisacodyl 5 MG EC tablet Commonly known as: DULCOLAX Take 5 mg by mouth daily.   celecoxib 200 MG capsule Commonly known as: CELEBREX Take 1 capsule (200 mg total) by mouth daily.   docusate sodium 100 MG capsule Commonly known as: COLACE Take 1 capsule (100 mg total) by mouth 2 (two) times daily.   ergocalciferol 1.25 MG (50000 UT) capsule Commonly known as: VITAMIN D2 Take 50,000 Units by mouth every Tuesday.   HYDROmorphone 2 MG tablet Commonly known as: DILAUDID Take 3 tablets (6 mg total) by mouth every 4 (four) hours as  needed for severe pain.   omeprazole 20 MG capsule Commonly known as: PRILOSEC Take 20 mg by mouth daily.   ondansetron 4 MG tablet Commonly known as: ZOFRAN Take 1 tablet (4 mg total) by mouth every 6 (six) hours as needed for nausea.   senna 8.6 MG Tabs tablet Commonly known as: SENOKOT Take 2 tablets (17.2 mg total) by mouth at bedtime.   tamsulosin 0.4 MG Caps capsule Commonly known as: FLOMAX Take 0.4 mg by mouth at bedtime.       Diagnostic Studies: Dg Pelvis Portable  Result Date: 08/22/2019 CLINICAL DATA:  Avascular necrosis of the left hip. Status post left total hip arthroplasty. EXAM: PORTABLE PELVIS 1-2 VIEWS COMPARISON:  Radiograph dated 11/26/2012 FINDINGS: Single AP view of the pelvis demonstrates that the acetabular and femoral components of the new left total hip prosthesis are in excellent position in the AP projection. No fractures. Expected postsurgical air in the adjacent soft tissues. Right total hip prosthesis in place with no evidence of loosening. IMPRESSION: Satisfactory appearance of the new left total hip prosthesis. Electronically Signed   By: Lorriane Shire M.D.   On: 08/22/2019 15:12   Dg C-arm 1-60 Min-no Report  Result Date: 08/22/2019 CLINICAL DATA:  Intraoperative imaging provided for left total hip arthroplasty. EXAM: OPERATIVE LEFT HIP (WITH PELVIS IF PERFORMED) to VIEWS TECHNIQUE: Fluoroscopic spot image(s) were submitted for interpretation post-operatively. COMPARISON:  None. FINDINGS: Two spot fluoro graphic images show a total left hip arthroplasty. The femoral and acetabular components appear well seated and aligned. No evidence of an operative complication. IMPRESSION: Portable imaging for left hip total arthroplasty. Electronically Signed   By: Lajean Manes M.D.   On: 08/22/2019 14:00   Dg Hip Operative Unilat W Or W/o Pelvis Left  Result Date: 08/22/2019 CLINICAL DATA:  Intraoperative imaging provided for left total hip arthroplasty.  EXAM: OPERATIVE LEFT HIP (WITH PELVIS IF PERFORMED) to VIEWS TECHNIQUE: Fluoroscopic spot image(s) were submitted for interpretation post-operatively. COMPARISON:  None. FINDINGS: Two spot fluoro graphic images show a total left hip arthroplasty. The femoral and acetabular components appear well seated and aligned. No evidence of an operative complication. IMPRESSION: Portable imaging for left hip total arthroplasty. Electronically Signed   By: Lajean Manes M.D.   On: 08/22/2019 14:00    Disposition: Discharge disposition: 01-Home or Self Care       Discharge Instructions    Call MD / Call 911   Complete by: As directed    If you experience chest pain or shortness of breath, CALL 911 and be transported to the hospital emergency  room.  If you develope a fever above 101 F, pus (white drainage) or increased drainage or redness at the wound, or calf pain, call your surgeon's office.   Constipation Prevention   Complete by: As directed    Drink plenty of fluids.  Prune juice may be helpful.  You may use a stool softener, such as Colace (over the counter) 100 mg twice a day.  Use MiraLax (over the counter) for constipation as needed.   Diet - low sodium heart healthy   Complete by: As directed    Driving restrictions   Complete by: As directed    No driving for 6 weeks   Increase activity slowly as tolerated   Complete by: As directed    Lifting restrictions   Complete by: As directed    No lifting for 4 weeks   TED hose   Complete by: As directed    Use stockings (TED hose) for 2 weeks on both leg(s).  You may remove them at night for sleeping.      Follow-up Information    Shubham Thackston, Aaron Edelman, MD. Schedule an appointment as soon as possible for a visit in 2 weeks.   Specialty: Orthopedic Surgery Why: For wound re-check Contact information: [address] [place] [address] [phone]            Signed: Hilton Cork Coreyon Nicotra 08/23/2019, 10:07 AM

## 2019-08-23 NOTE — Progress Notes (Signed)
Physical Therapy Treatment Patient Details Name: [name] [name] MRN: [id number] DOB: 04-Jul-1946 Today's Date: 08/23/2019    History of Present Illness Patient is 73 y.o. male s/p Lt THA anterior approach with PMH significant for HTN, alcohol abuse, back pain, HLD, CAD, GERD, OA, and Rt THA in 2014.    PT Comments    Pt ambulated in hallway, practiced safe stair technique and performed LE exercises.  Pt provided with HEP handout.  Pt had no further questions and feels ready for d/c home.   Follow Up Recommendations  Follow surgeon's recommendation for DC plan and follow-up therapies     Equipment Recommendations  Rolling walker with 5" wheels    Recommendations for Other Services       Precautions / Restrictions Precautions Precautions: Fall    Mobility  Bed Mobility Overal bed mobility: Needs Assistance Bed Mobility: Supine to Sit     Supine to sit: Supervision        Transfers Overall transfer level: Needs assistance Equipment used: Rolling walker (2 wheeled) Transfers: Sit to/from Stand Sit to Stand: Min guard         General transfer comment: verbal cues for UE and LE positioning  Ambulation/Gait Ambulation/Gait assistance: Min guard Gait Distance (Feet): 160 Feet Assistive device: Rolling walker (2 wheeled) Gait Pattern/deviations: Step-through pattern;Decreased stance time - left;Antalgic Gait velocity: decreased   General Gait Details: verbal cues for sequence, RW positioning, posture   Stairs Stairs: Yes Stairs assistance: Min guard Stair Management: Step to pattern;Forwards;One rail Right Number of Stairs: 3 General stair comments: verbal cues for sequence and safety; pt reports understanding   Wheelchair Mobility    Modified Rankin (Stroke Patients Only)       Balance                                            Cognition Arousal/Alertness: Awake/alert Behavior During Therapy: WFL for tasks  assessed/performed Overall Cognitive Status: Within Functional Limits for tasks assessed                                        Exercises Total Joint Exercises Ankle Circles/Pumps: AROM;Both;10 reps Quad Sets: AROM;Both;10 reps Hip ABduction/ADduction: AROM;Supine;Standing;Left;10 reps Long Arc Quad: AROM;Left;Seated;10 reps Knee Flexion: Left;10 reps;AROM;Standing Marching in Standing: AROM;Left;10 reps;Standing Standing Hip Extension: AROM;Left;Standing;10 reps    General Comments        Pertinent Vitals/Pain Pain Assessment: 0-10 Pain Score: 3  Pain Location: Left hip Pain Descriptors / Indicators: Aching;Sore Pain Intervention(s): Monitored during session;Repositioned;Ice applied    Home Living                      Prior Function            PT Goals (current goals can now be found in the care plan section) Progress towards PT goals: Progressing toward goals    Frequency    7X/week      PT Plan Current plan remains appropriate    Co-evaluation              AM-PAC PT "6 Clicks" Mobility   Outcome Measure  Help needed turning from your back to your side while in a flat bed without using bedrails?: A Little Help needed moving from lying on  your back to sitting on the side of a flat bed without using bedrails?: A Little Help needed moving to and from a bed to a chair (including a wheelchair)?: A Little Help needed standing up from a chair using your arms (e.g., wheelchair or bedside chair)?: A Little Help needed to walk in hospital room?: A Little Help needed climbing 3-5 steps with a railing? : A Little 6 Click Score: 18    End of Session Equipment Utilized During Treatment: Gait belt Activity Tolerance: Patient tolerated treatment well Patient left: with call bell/phone within reach;in chair Nurse Communication: Mobility status PT Visit Diagnosis: Muscle weakness (generalized) (M62.81);Difficulty in walking, not elsewhere  classified (R26.2)     Time: [id number] PT Time Calculation (min) (ACUTE ONLY): 20 min  Charges:  $Gait Training: 8-22 mins                    Carmelia Bake, PT, DPT Acute Rehabilitation Services Office: [phone] Pager: [phone]   Trena Platt 08/23/2019, 5:14 PM

## 2019-12-24 ENCOUNTER — Other Ambulatory Visit: Payer: Self-pay

## 2019-12-24 ENCOUNTER — Ambulatory Visit
Admission: RE | Admit: 2019-12-24 | Discharge: 2019-12-24 | Disposition: A | Discharge status: Home / Self Care | Payer: Medicare Other | Source: Ambulatory Visit | Attending: Orthopedic Surgery | Admitting: Orthopedic Surgery

## 2019-12-24 DIAGNOSIS — G8929 Other chronic pain: Secondary | ICD-10-CM

## 2019-12-24 MED ORDER — IOPAMIDOL (ISOVUE-M 200) INJECTION 41%
1.0000 mL | Freq: Once | INTRAMUSCULAR | Status: AC
  Administered 2019-12-24: 1 mL via EPIDURAL

## 2019-12-24 MED ORDER — METHYLPREDNISOLONE ACETATE 40 MG/ML INJ SUSP (RADIOLOG
120.0000 mg | Freq: Once | INTRAMUSCULAR | Status: AC
  Administered 2019-12-24: 120 mg via EPIDURAL

## 2019-12-24 NOTE — Discharge Instructions (Signed)

## 2020-01-27 ENCOUNTER — Other Ambulatory Visit: Payer: Self-pay

## 2020-01-27 ENCOUNTER — Ambulatory Visit: Payer: Medicare Other | Attending: Orthopedic Surgery | Admitting: Physical Therapy

## 2020-01-27 ENCOUNTER — Encounter: Payer: Self-pay | Admitting: Physical Therapy

## 2020-01-27 DIAGNOSIS — M25552 Pain in left hip: Secondary | ICD-10-CM | POA: Diagnosis present

## 2020-01-27 DIAGNOSIS — M25652 Stiffness of left hip, not elsewhere classified: Secondary | ICD-10-CM | POA: Insufficient documentation

## 2020-01-27 DIAGNOSIS — R262 Difficulty in walking, not elsewhere classified: Secondary | ICD-10-CM

## 2020-01-27 NOTE — Therapy (Signed)
[facility] [place], [place], [address] Phone: [phone]   Fax:  [phone]  Physical Therapy Evaluation  Patient Details  Name: [name] [name] MRN: [id number] Date of Birth: 1946-08-24 Referring Provider (PT): Rod Can, MD   Encounter Date: 01/27/2020  PT End of Session - 01/27/20 1821    Visit Number  1    Number of Visits  8    Date for PT Re-Evaluation  03/02/20    Authorization Type  FOTO; Progress note every 10th visit; KX modifier at 15th visit    PT Start Time  1116    PT Stop Time  1200    PT Time Calculation (min)  44 min    Activity Tolerance  Patient limited by pain    Behavior During Therapy  [facility] for tasks assessed/performed       Past Medical History:  Diagnosis Date  . Abscess of right hip 04/11/2012  . Arthritis    back  . Avascular necrosis of right femoral head ([place]) 11/26/2012  . Chronic back pain   . Coronary artery disease 2009   Single-vessel coronary artery disease nonobstructive noted on cardiac cath   . Depression   . Gastric ulcer   . GERD (gastroesophageal reflux disease)   . History of anemia   . History of crack cocaine use   . History of kidney stones   . History of staph infection    after hip replacement  . Hyperlipidemia   . Hypertension    lost weight and has not needed meds.  . Medial epicondylitis   . Nephrolithiasis   . Neuropathy   . Pneumonia 2017  . RBBB 07/19/2016   noted on EKG  . Skin cancer    20 removed  . Spinal stenosis     Past Surgical History:  Procedure Laterality Date  . APPENDECTOMY    . CARDIAC CATHETERIZATION  2009  . CHOLECYSTECTOMY    . HEMORROIDECTOMY    . I & D EXTREMITY  04/14/2012   Procedure: IRRIGATION AND DEBRIDEMENT EXTREMITY;  Surgeon: Johnny Bridge, MD;  Location: [place];  Service: Orthopedics;  Laterality: Right;  I & D Hip  . TOTAL HIP ARTHROPLASTY Right 11/26/2012   Procedure: TOTAL HIP ARTHROPLASTY;  Surgeon: Johnny Bridge, MD;  Location: [place];  Service: Orthopedics;  Laterality: Right;  . TOTAL HIP ARTHROPLASTY Left 08/22/2019   Procedure: TOTAL HIP ARTHROPLASTY ANTERIOR APPROACH;  Surgeon: Rod Can, MD;  Location: [facility];  Service: Orthopedics;  Laterality: Left;    There were no vitals filed for this visit.   Subjective Assessment - 01/27/20 1230    Subjective  COVID-19 screening performed upon arrival.Patient arrives to physical therapy with reports of left hip pain that began two weeks ago. Patient reported using his left leg to push a jack under his car when he felt pain. Patient recently had a left hip replacement on 10/21/2018. Patient reports pain with transitional movements and requires UE and LE support to help guide his leg in/out of the bed and his truck. Patient reports pain at worst at 10/10 and pain at best as 6/10 after a steroid injection. Patient's goals are to decrease pain, improve movement, and walk better.    Pertinent History  Left THA 08/22/2019, neuropathy    Limitations  Sitting;Walking;House hold activities;Standing    Diagnostic tests  x-ray: normal, THA intact    Patient Stated Goals  walk better    Currently in  Pain?  Yes    Pain Score  6     Pain Location  Hip    Pain Orientation  Left    Pain Descriptors / Indicators  Sore    Pain Type  Acute pain    Pain Onset  1 to 4 weeks ago    Pain Frequency  Constant    Aggravating Factors   trying to get out of bed, and in/out of truck    Pain Relieving Factors  laying in bed    Effect of Pain on Daily Activities  Pain with movement.         [facility] PT Assessment - 01/27/20 0001      Assessment   Medical Diagnosis  Trochanteric bursitis of left hip    Referring Provider (PT)  Rod Can, MD    Onset Date/Surgical Date  --   "two weeks ago"   Next MD Visit  n/a    Prior Therapy  home health for left THA      Precautions   Precautions  Anterior Hip      Restrictions   Weight Bearing Restrictions  No       Balance Screen   Has the patient fallen in the past 6 months  No    Has the patient had a decrease in activity level because of a fear of falling?   Yes    Is the patient reluctant to leave their home because of a fear of falling?   No      Home Film/video editor residence      Prior Function   Level of Independence  Independent      Observation/Other Assessments   Focus on Therapeutic Outcomes (FOTO)   --      ROM / Strength   AROM / PROM / Strength  AROM;Strength      AROM   AROM Assessment Site  Hip    Right/Left Hip  Left    Left Hip Flexion  60    Left Hip External Rotation   15    Left Hip Internal Rotation   5    Left Hip ABduction  10      Strength   Strength Assessment Site  Hip;Knee    Right/Left Hip  Left    Left Hip Flexion  2+/5    Left Hip ABduction  3-/5    Right/Left Knee  Left    Left Knee Flexion  4-/5    Left Knee Extension  4-/5      Palpation   Palpation comment  very tender to left greater trochanter and mid to distal ITB      Transfers   Comments  use of momentum with right foot hooked under left for raising legs on/off plinth      Ambulation/Gait   Gait Pattern  Step-to pattern;Step-through pattern;Decreased stance time - left;Decreased step length - right;Decreased stride length;Decreased hip/knee flexion - left;Decreased weight shift to left;Trunk flexed;Lateral hip instability;Trendelenburg                Objective measurements completed on examination: See above findings.              PT Education - 01/27/20 1816    Education Details  hamstring set, glute set, clam shell, pillow squeeze, quad set    Person(s) Educated  Patient    Methods  Explanation;Demonstration;Handout    Comprehension  Verbalized understanding;Returned demonstration  PT Long Term Goals - 01/27/20 1822      PT LONG TERM GOAL #1   Title  Patient will be independent with HEP    Time  4    Period  Weeks     Status  New      PT LONG TERM GOAL #2   Title  Patient will report ability to perform ADLs and home task with left hip pain less than or equal to 3/10    Time  4    Period  Weeks    Status  New      PT LONG TERM GOAL #3   Title  Patient will demonstrate 4/5 or greater left hip MMT in all planes to improve stability during functional tasks.    Time  4    Period  Weeks    Status  New             Plan - 01/27/20 1904    Clinical Impression Statement  Patient is a 74 year old male who presents to physical therapy with left hip pain and decreased left hip ROM that began about two weeks ago. Patient noted increased pain with transitional movement and used UEs and LE to guide left LE on and off the plinth. Patient ambulates with an antalgic gait pattern, decreased left stance time, wide BOS and Trendelenburg gait. Patient and PT discussed plan of care and HEP to which patient reported understanding. Patient would benefit from skilled physical therapy to address deficits and address patient's goals.    Personal Factors and Comorbidities  Comorbidity 2    Comorbidities  left THA anterior approach 08/22/2019; neuropathy    Examination-Activity Limitations  Bed Mobility;Locomotion Level;Transfers;Stairs;Stand;Sit    Stability/Clinical Decision Making  Stable/Uncomplicated    Clinical Decision Making  Low    Rehab Potential  Good    PT Frequency  2x / week    PT Duration  4 weeks    PT Treatment/Interventions  ADLs/Self Care Home Management;Gait training;Stair training;Functional mobility training;Moist Heat;Therapeutic activities;Therapeutic exercise;Cryotherapy;Electrical Stimulation;Balance training;Neuromuscular re-education;Manual techniques;Passive range of motion;Patient/family education    PT Next Visit Plan  nustep, gentle hip strengthening, gait and stair training, modalties PRN for pain relief    PT Home Exercise Plan  see patient education section    Consulted and Agree with Plan  of Care  Patient       Patient will benefit from skilled therapeutic intervention in order to improve the following deficits and impairments:  Abnormal gait, Difficulty walking, Decreased range of motion, Decreased activity tolerance, Decreased strength, Pain, Decreased balance  Visit Diagnosis: Pain in left hip - Plan: PT plan of care cert/re-cert  Stiffness of left hip, not elsewhere classified - Plan: PT plan of care cert/re-cert  Difficulty in walking, not elsewhere classified - Plan: PT plan of care cert/re-cert     Problem List Patient Active Problem List   Diagnosis Date Noted  . Avascular necrosis of bone of left hip ([place]) 08/22/2019  . Avascular necrosis of hip, left ([place]) 08/22/2019  . Guaiac positive stools 02/21/2017  . Anemia 07/15/2015  . Leukocytosis 07/15/2015  . Sepsis ([place]) 07/14/2015  . Weakness 07/14/2015  . Essential hypertension 07/14/2015  . CAP (community acquired pneumonia) 07/12/2015  . Hypertension 02/16/2014  . Acute encephalopathy 02/16/2014  . Acute sinusitis 02/16/2014  . TIA (transient ischemic attack) 02/16/2014  . Acute kidney injury ([place]) 02/16/2014  . Avascular necrosis of right femoral head ([place]) 11/26/2012  . Abscess of right hip  04/11/2012  . ALCOHOL ABUSE 05/24/2010  . SYNCOPE AND COLLAPSE 05/24/2010  . SHORTNESS OF BREATH 05/24/2010  . SNORING 05/24/2010    Gabriela Eves, PT, DPT 01/27/2020, 7:25 PM  [facility] [address] [place], [place], [address] Phone: [phone]   Fax:  [phone]  Name: [name] [name] MRN: [id number] Date of Birth: 09-Jan-1946

## 2020-01-30 ENCOUNTER — Ambulatory Visit: Payer: Medicare Other | Admitting: Physical Therapy

## 2020-01-30 ENCOUNTER — Other Ambulatory Visit: Payer: Self-pay

## 2020-01-30 DIAGNOSIS — R262 Difficulty in walking, not elsewhere classified: Secondary | ICD-10-CM

## 2020-01-30 DIAGNOSIS — M25652 Stiffness of left hip, not elsewhere classified: Secondary | ICD-10-CM

## 2020-01-30 DIAGNOSIS — M25552 Pain in left hip: Secondary | ICD-10-CM

## 2020-01-30 NOTE — Therapy (Signed)
[facility] [place], [place], [address] Phone: [phone]   Fax:  [phone]  Physical Therapy Treatment  Patient Details  Name: [name] [name] MRN: [id number] Date of Birth: 1946/08/10 Referring Provider (PT): Rod Can, MD   Encounter Date: 01/30/2020  PT End of Session - 01/30/20 1120    Visit Number  2    Number of Visits  8    Date for PT Re-Evaluation  03/02/20    Authorization Type  FOTO; Progress note every 10th visit; KX modifier at 15th visit    PT Start Time  1117    PT Stop Time  1206    PT Time Calculation (min)  49 min    Activity Tolerance  Patient tolerated treatment well    Behavior During Therapy  [facility] for tasks assessed/performed       Past Medical History:  Diagnosis Date  . Abscess of right hip 04/11/2012  . Arthritis    back  . Avascular necrosis of right femoral head ([place]) 11/26/2012  . Chronic back pain   . Coronary artery disease 2009   Single-vessel coronary artery disease nonobstructive noted on cardiac cath   . Depression   . Gastric ulcer   . GERD (gastroesophageal reflux disease)   . History of anemia   . History of crack cocaine use   . History of kidney stones   . History of staph infection    after hip replacement  . Hyperlipidemia   . Hypertension    lost weight and has not needed meds.  . Medial epicondylitis   . Nephrolithiasis   . Neuropathy   . Pneumonia 2017  . RBBB 07/19/2016   noted on EKG  . Skin cancer    20 removed  . Spinal stenosis     Past Surgical History:  Procedure Laterality Date  . APPENDECTOMY    . CARDIAC CATHETERIZATION  2009  . CHOLECYSTECTOMY    . HEMORROIDECTOMY    . I & D EXTREMITY  04/14/2012   Procedure: IRRIGATION AND DEBRIDEMENT EXTREMITY;  Surgeon: Johnny Bridge, MD;  Location: [facility];  Service: Orthopedics;  Laterality: Right;  I & D Hip  . TOTAL HIP ARTHROPLASTY Right 11/26/2012   Procedure: TOTAL HIP ARTHROPLASTY;  Surgeon:  Johnny Bridge, MD;  Location: [facility];  Service: Orthopedics;  Laterality: Right;  . TOTAL HIP ARTHROPLASTY Left 08/22/2019   Procedure: TOTAL HIP ARTHROPLASTY ANTERIOR APPROACH;  Surgeon: Rod Can, MD;  [facility];  Service: Orthopedics;  Laterality: Left;    There were no vitals filed for this visit.  Subjective Assessment - 01/30/20 1138    Subjective  COVID-19 screening performed upon arrival. Patient reports feeling a 5/10 today in the left hip.    Pertinent History  Left THA 08/22/2019, neuropathy    Limitations  Sitting;Walking;House hold activities;Standing    Diagnostic tests  x-ray: normal, THA intact    Patient Stated Goals  walk better    Currently in Pain?  Yes    Pain Score  5     Pain Location  Hip    Pain Orientation  Left    Pain Descriptors / Indicators  Sore    Pain Type  Acute pain    Pain Onset  1 to 4 weeks ago    Pain Frequency  Constant         [facility] PT Assessment - 01/30/20 0001      Assessment   Medical Diagnosis  Trochanteric bursitis of left hip    Referring Provider (PT)  Rod Can, MD    Next MD Visit  n/a    Prior Therapy  home health for left THA      Precautions   Precautions  Anterior Hip                   [facility] Adult PT Treatment/Exercise - 01/30/20 0001      Exercises   Exercises  Knee/Hip      Knee/Hip Exercises: Aerobic   Nustep  Level 3 x10 mins      Knee/Hip Exercises: Supine   Short Arc Quad Sets  AROM;Left;2 sets;10 reps    Short Arc Quad Sets Limitations  5" hold    Hip Adduction Isometric  Strengthening;2 sets;10 reps;Both   pillow squeeze 5" hold   Other Supine Knee/Hip Exercises  hamstring set 5" hold x20    Other Supine Knee/Hip Exercises  hip abduction isometric 5" hold x20      Modalities   Modalities  Electrical Stimulation;Moist Heat      Moist Heat Therapy   Number Minutes Moist Heat  10 Minutes    Moist Heat Location  Hip      Electrical Stimulation   Electrical Stimulation  Location  left hip    Electrical Stimulation Action  pre-mod    Electrical Stimulation Parameters  80-150 hz x10 mins    Electrical Stimulation Goals  Pain      Manual Therapy   Manual Therapy  Soft tissue mobilization    Soft tissue mobilization  STW/M to left hip and TFL to decrease pain                  PT Long Term Goals - 01/27/20 1822      PT LONG TERM GOAL #1   Title  Patient will be independent with HEP    Time  4    Period  Weeks    Status  New      PT LONG TERM GOAL #2   Title  Patient will report ability to perform ADLs and home task with left hip pain less than or equal to 3/10    Time  4    Period  Weeks    Status  New      PT LONG TERM GOAL #3   Title  Patient will demonstrate 4/5 or greater left hip MMT in all planes to improve stability during functional tasks.    Time  4    Period  Weeks    Status  New            Plan - 01/30/20 1128    Clinical Impression Statement  [place] responded fairly well to therapy session with no reports of increased pain. Patient demonstrated good form with TEs after explanation. Patient noted with increased tenderness to left TFL during STW/M. Patient reported a decrease of pain after STW/M. No adverse affects upon removal of modalities.    Personal Factors and Comorbidities  Comorbidity 2    Comorbidities  left THA anterior approach 08/22/2019; neuropathy    Examination-Activity Limitations  Bed Mobility;Locomotion Level;Transfers;Stairs;Stand;Sit    Stability/Clinical Decision Making  Stable/Uncomplicated    Clinical Decision Making  Low    Rehab Potential  Good    PT Frequency  2x / week    PT Duration  4 weeks    PT Treatment/Interventions  ADLs/Self Care Home Management;Gait training;Stair training;Functional mobility training;Moist Heat;Therapeutic activities;Therapeutic exercise;Cryotherapy;Dealer  Stimulation;Balance training;Neuromuscular re-education;Manual techniques;Passive range of  motion;Patient/family education    PT Next Visit Plan  nustep, gentle hip strengthening, gait and stair training, modalties PRN for pain relief    PT Home Exercise Plan  see patient education section    Consulted and Agree with Plan of Care  Patient       Patient will benefit from skilled therapeutic intervention in order to improve the following deficits and impairments:  Abnormal gait, Difficulty walking, Decreased range of motion, Decreased activity tolerance, Decreased strength, Pain, Decreased balance  Visit Diagnosis: Pain in left hip  Stiffness of left hip, not elsewhere classified  Difficulty in walking, not elsewhere classified     Problem List Patient Active Problem List   Diagnosis Date Noted  . Avascular necrosis of bone of left hip ([place]) 08/22/2019  . Avascular necrosis of hip, left ([place]) 08/22/2019  . Guaiac positive stools 02/21/2017  . Anemia 07/15/2015  . Leukocytosis 07/15/2015  . Sepsis ([place]) 07/14/2015  . Weakness 07/14/2015  . Essential hypertension 07/14/2015  . CAP (community acquired pneumonia) 07/12/2015  . Hypertension 02/16/2014  . Acute encephalopathy 02/16/2014  . Acute sinusitis 02/16/2014  . TIA (transient ischemic attack) 02/16/2014  . Acute kidney injury ([place]) 02/16/2014  . Avascular necrosis of right femoral head ([place]) 11/26/2012  . Abscess of right hip 04/11/2012  . ALCOHOL ABUSE 05/24/2010  . SYNCOPE AND COLLAPSE 05/24/2010  . SHORTNESS OF BREATH 05/24/2010  . SNORING 05/24/2010    Gabriela Eves, PT, DPT 01/30/2020, 12:16 PM  [facility] [address] [place], [place], [address] Phone: [phone]   Fax:  [phone]  Name: ANDIN HAFEY MRN: [id number] Date of Birth: September 19, 1946

## 2020-02-03 ENCOUNTER — Ambulatory Visit: Payer: Medicare Other | Admitting: Physical Therapy

## 2020-02-03 ENCOUNTER — Other Ambulatory Visit: Payer: Self-pay

## 2020-02-03 ENCOUNTER — Encounter: Payer: Self-pay | Admitting: Physical Therapy

## 2020-02-03 DIAGNOSIS — M25552 Pain in left hip: Secondary | ICD-10-CM | POA: Diagnosis not present

## 2020-02-03 DIAGNOSIS — R262 Difficulty in walking, not elsewhere classified: Secondary | ICD-10-CM

## 2020-02-03 DIAGNOSIS — M25652 Stiffness of left hip, not elsewhere classified: Secondary | ICD-10-CM

## 2020-02-03 NOTE — Therapy (Signed)
[facility] [place], [place], [address] Phone: [phone]   Fax:  [phone]  Physical Therapy Treatment  Patient Details  Name: [name] [name] MRN: [id number] Date of Birth: 06/05/1946 Referring Provider (PT): Rod Can, MD   Encounter Date: 02/03/2020  PT End of Session - 02/03/20 1147    Visit Number  3    Number of Visits  8    Date for PT Re-Evaluation  03/02/20    Authorization Type  FOTO; Progress note every 10th visit; KX modifier at 15th visit    PT Start Time  1115    PT Stop Time  1155    PT Time Calculation (min)  40 min    Activity Tolerance  Patient tolerated treatment well    Behavior During Therapy  [facility] for tasks assessed/performed       Past Medical History:  Diagnosis Date  . Abscess of right hip 04/11/2012  . Arthritis    back  . Avascular necrosis of right femoral head ([place]) 11/26/2012  . Chronic back pain   . Coronary artery disease 2009   Single-vessel coronary artery disease nonobstructive noted on cardiac cath   . Depression   . Gastric ulcer   . GERD (gastroesophageal reflux disease)   . History of anemia   . History of crack cocaine use   . History of kidney stones   . History of staph infection    after hip replacement  . Hyperlipidemia   . Hypertension    lost weight and has not needed meds.  . Medial epicondylitis   . Nephrolithiasis   . Neuropathy   . Pneumonia 2017  . RBBB 07/19/2016   noted on EKG  . Skin cancer    20 removed  . Spinal stenosis     Past Surgical History:  Procedure Laterality Date  . APPENDECTOMY    . CARDIAC CATHETERIZATION  2009  . CHOLECYSTECTOMY    . HEMORROIDECTOMY    . I & D EXTREMITY  04/14/2012   Procedure: IRRIGATION AND DEBRIDEMENT EXTREMITY;  Surgeon: Johnny Bridge, MD;  Location: [facility];  Service: Orthopedics;  Laterality: Right;  I & D Hip  . TOTAL HIP ARTHROPLASTY Right 11/26/2012   Procedure: TOTAL HIP ARTHROPLASTY;  Surgeon:  Johnny Bridge, MD;  Location: [facility];  Service: Orthopedics;  Laterality: Right;  . TOTAL HIP ARTHROPLASTY Left 08/22/2019   Procedure: TOTAL HIP ARTHROPLASTY ANTERIOR APPROACH;  Surgeon: Rod Can, MD;  [facility];  Service: Orthopedics;  Laterality: Left;    There were no vitals filed for this visit.  Subjective Assessment - 02/03/20 1115    Subjective  COVID-19 screening performed upon arrival. Patient arrived with increased pain for unknown reason    Pertinent History  Left THA 08/22/2019, neuropathy    Limitations  Sitting;Walking;House hold activities;Standing    Diagnostic tests  x-ray: normal, THA intact    Patient Stated Goals  walk better    Currently in Pain?  Yes    Pain Score  10-Worst pain ever    Pain Location  Hip    Pain Orientation  Left    Pain Descriptors / Indicators  Sore    Pain Type  Acute pain    Pain Onset  1 to 4 weeks ago    Pain Frequency  Constant    Aggravating Factors   increased activity and certain movements    Pain Relieving Factors  at rest  [place] Adult PT Treatment/Exercise - 02/03/20 0001      Knee/Hip Exercises: Aerobic   Nustep  L1 x87min UE/LE      Modalities   Modalities  Cryotherapy      Moist Heat Therapy   Number Minutes Moist Heat  --    Moist Heat Location  --      Cryotherapy   Number Minutes Cryotherapy  10 Minutes    Cryotherapy Location  Hip   Left   Type of Cryotherapy  Ice pack      Electrical Stimulation   Electrical Stimulation Location  left hip    Electrical Stimulation Action  premod    Electrical Stimulation Parameters  80-150hz  x82min    Electrical Stimulation Goals  Pain      Manual Therapy   Manual Therapy  Soft tissue mobilization    Soft tissue mobilization  STW/M to left hip and TFL to decrease pain                  PT Long Term Goals - 02/03/20 1148      PT LONG TERM GOAL #1   Title  Patient will be independent with HEP    Time  4     Period  Weeks    Status  On-going      PT LONG TERM GOAL #2   Title  Patient will report ability to perform ADLs and home task with left hip pain less than or equal to 3/10    Time  4    Period  Weeks    Status  On-going      PT LONG TERM GOAL #3   Title  Patient will demonstrate 4/5 or greater left hip MMT in all planes to improve stability during functional tasks.    Time  4    Period  Weeks    Status  On-going            Plan - 02/03/20 1148    Clinical Impression Statement  Patient tolerated treatment fair due to increased pain in hip today. Patient unsure of increased pain yet he reported he does a lot around house and yard and does not take rest breaks. Today focused on gentle ROM with no resistance followed by manual STW with modalities to reduce pain and tone. Patient current goals ongoing due to pain deficts.    Personal Factors and Comorbidities  Comorbidity 2    Comorbidities  left THA anterior approach 08/22/2019; neuropathy    Examination-Activity Limitations  Bed Mobility;Locomotion Level;Transfers;Stairs;Stand;Sit    Stability/Clinical Decision Making  Stable/Uncomplicated    Rehab Potential  Good    PT Frequency  2x / week    PT Duration  4 weeks    PT Treatment/Interventions  ADLs/Self Care Home Management;Gait training;Stair training;Functional mobility training;Moist Heat;Therapeutic activities;Therapeutic exercise;Cryotherapy;Electrical Stimulation;Balance training;Neuromuscular re-education;Manual techniques;Passive range of motion;Patient/family education    PT Next Visit Plan  assess then cont with nustep, gentle hip strengthening, gait and stair training, modalties PRN for pain relief    Consulted and Agree with Plan of Care  Patient       Patient will benefit from skilled therapeutic intervention in order to improve the following deficits and impairments:  Abnormal gait, Difficulty walking, Decreased range of motion, Decreased activity tolerance,  Decreased strength, Pain, Decreased balance  Visit Diagnosis: Pain in left hip  Stiffness of left hip, not elsewhere classified  Difficulty in walking, not elsewhere classified     Problem List Patient  Active Problem List   Diagnosis Date Noted  . Avascular necrosis of bone of left hip ([place]) 08/22/2019  . Avascular necrosis of hip, left ([place]) 08/22/2019  . Guaiac positive stools 02/21/2017  . Anemia 07/15/2015  . Leukocytosis 07/15/2015  . Sepsis ([place]) 07/14/2015  . Weakness 07/14/2015  . Essential hypertension 07/14/2015  . CAP (community acquired pneumonia) 07/12/2015  . Hypertension 02/16/2014  . Acute encephalopathy 02/16/2014  . Acute sinusitis 02/16/2014  . TIA (transient ischemic attack) 02/16/2014  . Acute kidney injury ([place]) 02/16/2014  . Avascular necrosis of right femoral head ([place]) 11/26/2012  . Abscess of right hip 04/11/2012  . ALCOHOL ABUSE 05/24/2010  . SYNCOPE AND COLLAPSE 05/24/2010  . SHORTNESS OF BREATH 05/24/2010  . SNORING 05/24/2010    Jdyn Parkerson P, PTA 02/03/2020, 11:58 AM  [facility] [address] [place], [place], [address] Phone: [phone]   Fax:  [phone]  Name: [name] [name] MRN: [id number] Date of Birth: Feb 09, 1946

## 2020-02-05 ENCOUNTER — Encounter: Payer: Medicare Other | Admitting: Physical Therapy

## 2020-02-06 ENCOUNTER — Other Ambulatory Visit: Payer: Self-pay

## 2020-02-06 ENCOUNTER — Ambulatory Visit: Payer: Medicare Other | Admitting: Physical Therapy

## 2020-02-06 ENCOUNTER — Encounter: Payer: Self-pay | Admitting: Physical Therapy

## 2020-02-06 DIAGNOSIS — M25552 Pain in left hip: Secondary | ICD-10-CM

## 2020-02-06 DIAGNOSIS — M25652 Stiffness of left hip, not elsewhere classified: Secondary | ICD-10-CM

## 2020-02-06 DIAGNOSIS — R262 Difficulty in walking, not elsewhere classified: Secondary | ICD-10-CM

## 2020-02-06 NOTE — Therapy (Signed)
[facility] [place], [place], [address] Phone: [phone]   Fax:  [phone]  Physical Therapy Treatment  Patient Details  Name: [name] [name] MRN: [id number] Date of Birth: January 20, 1946 Referring Provider (PT): Rod Can, MD   Encounter Date: 02/06/2020  PT End of Session - 02/06/20 1110    Visit Number  4    Number of Visits  8    Date for PT Re-Evaluation  03/02/20    Authorization Type  FOTO; Progress note every 10th visit; KX modifier at 15th visit    PT Start Time  1030    PT Stop Time  1118    PT Time Calculation (min)  48 min    Activity Tolerance  Patient tolerated treatment well;Patient limited by pain    Behavior During Therapy  [facility] for tasks assessed/performed       Past Medical History:  Diagnosis Date  . Abscess of right hip 04/11/2012  . Arthritis    back  . Avascular necrosis of right femoral head ([place]) 11/26/2012  . Chronic back pain   . Coronary artery disease 2009   Single-vessel coronary artery disease nonobstructive noted on cardiac cath   . Depression   . Gastric ulcer   . GERD (gastroesophageal reflux disease)   . History of anemia   . History of crack cocaine use   . History of kidney stones   . History of staph infection    after hip replacement  . Hyperlipidemia   . Hypertension    lost weight and has not needed meds.  . Medial epicondylitis   . Nephrolithiasis   . Neuropathy   . Pneumonia 2017  . RBBB 07/19/2016   noted on EKG  . Skin cancer    20 removed  . Spinal stenosis     Past Surgical History:  Procedure Laterality Date  . APPENDECTOMY    . CARDIAC CATHETERIZATION  2009  . CHOLECYSTECTOMY    . HEMORROIDECTOMY    . I & D EXTREMITY  04/14/2012   Procedure: IRRIGATION AND DEBRIDEMENT EXTREMITY;  Surgeon: Johnny Bridge, MD;  Location: [facility];  Service: Orthopedics;  Laterality: Right;  I & D Hip  . TOTAL HIP ARTHROPLASTY Right 11/26/2012   Procedure: TOTAL HIP  ARTHROPLASTY;  Surgeon: Johnny Bridge, MD;  Location: [facility];  Service: Orthopedics;  Laterality: Right;  . TOTAL HIP ARTHROPLASTY Left 08/22/2019   Procedure: TOTAL HIP ARTHROPLASTY ANTERIOR APPROACH;  Surgeon: Rod Can, MD;  [facility];  Service: Orthopedics;  Laterality: Left;    There were no vitals filed for this visit.  Subjective Assessment - 02/06/20 1039    Subjective  COVID-19 screening performed upon arrival. Patient reports doing a lot of walking and "hurts like hell today"    Pertinent History  Left THA 08/22/2019, neuropathy    Limitations  Sitting;Walking;House hold activities;Standing    Diagnostic tests  x-ray: normal, THA intact    Patient Stated Goals  walk better    Currently in Pain?  Yes    Pain Score  10-Worst pain ever    Pain Location  Hip    Pain Orientation  Left    Pain Descriptors / Indicators  Sore    Pain Type  Acute pain    Pain Onset  1 to 4 weeks ago    Pain Frequency  Constant         [facility] PT Assessment - 02/06/20 0001  Assessment   Medical Diagnosis  Trochanteric bursitis of left hip    Referring Provider (PT)  Rod Can, MD    Next MD Visit  n/a    Prior Therapy  home health for left THA      Precautions   Precautions  Anterior Hip      Restrictions   Weight Bearing Restrictions  No                   [facility] Adult PT Treatment/Exercise - 02/06/20 0001      Exercises   Exercises  Knee/Hip      Knee/Hip Exercises: Aerobic   Nustep  L x66min UE/LE      Cryotherapy   Number Minutes Cryotherapy  10 Minutes    Cryotherapy Location  Hip    Type of Cryotherapy  Ice pack      Electrical Stimulation   Electrical Stimulation Location  left hip    Electrical Stimulation Action  pre-mod    Electrical Stimulation Parameters  80-150 hz x10 mins    Electrical Stimulation Goals  Pain      Manual Therapy   Manual Therapy  Soft tissue mobilization    Soft tissue mobilization  STW/M to left hip and upper  glutes TFL to decrease pain                  PT Long Term Goals - 02/03/20 1148      PT LONG TERM GOAL #1   Title  Patient will be independent with HEP    Time  4    Period  Weeks    Status  On-going      PT LONG TERM GOAL #2   Title  Patient will report ability to perform ADLs and home task with left hip pain less than or equal to 3/10    Time  4    Period  Weeks    Status  On-going      PT LONG TERM GOAL #3   Title  Patient will demonstrate 4/5 or greater left hip MMT in all planes to improve stability during functional tasks.    Time  4    Period  Weeks    Status  On-going            Plan - 02/06/20 1111    Clinical Impression Statement  Patient responded fairly with onging left hip pain. Patient reported a decrease of pain with STW/M focused at upper glute ITB and lateral hip. Patient and PT discussed allowing for rest to prevent any increase of pain during ADLs ane home activities. Patient reported understanding. Patient reported feeling worse after session upon standing.    Personal Factors and Comorbidities  Comorbidity 2    Comorbidities  left THA anterior approach 08/22/2019; neuropathy    Examination-Activity Limitations  Bed Mobility;Locomotion Level;Transfers;Stairs;Stand;Sit    Stability/Clinical Decision Making  Stable/Uncomplicated    Clinical Decision Making  Low    Rehab Potential  Good    PT Frequency  2x / week    PT Duration  4 weeks    PT Treatment/Interventions  ADLs/Self Care Home Management;Gait training;Stair training;Functional mobility training;Moist Heat;Therapeutic activities;Therapeutic exercise;Cryotherapy;Electrical Stimulation;Balance training;Neuromuscular re-education;Manual techniques;Passive range of motion;Patient/family education    PT Next Visit Plan  assess then cont with nustep, gentle hip strengthening, gait and stair training, modalties PRN for pain relief    PT Home Exercise Plan  see patient education section     Consulted and Agree  with Plan of Care  Patient       Patient will benefit from skilled therapeutic intervention in order to improve the following deficits and impairments:  Abnormal gait, Difficulty walking, Decreased range of motion, Decreased activity tolerance, Decreased strength, Pain, Decreased balance  Visit Diagnosis: Pain in left hip  Stiffness of left hip, not elsewhere classified  Difficulty in walking, not elsewhere classified     Problem List Patient Active Problem List   Diagnosis Date Noted  . Avascular necrosis of bone of left hip ([place]) 08/22/2019  . Avascular necrosis of hip, left ([place]) 08/22/2019  . Guaiac positive stools 02/21/2017  . Anemia 07/15/2015  . Leukocytosis 07/15/2015  . Sepsis ([place]) 07/14/2015  . Weakness 07/14/2015  . Essential hypertension 07/14/2015  . CAP (community acquired pneumonia) 07/12/2015  . Hypertension 02/16/2014  . Acute encephalopathy 02/16/2014  . Acute sinusitis 02/16/2014  . TIA (transient ischemic attack) 02/16/2014  . Acute kidney injury ([place]) 02/16/2014  . Avascular necrosis of right femoral head ([place]) 11/26/2012  . Abscess of right hip 04/11/2012  . ALCOHOL ABUSE 05/24/2010  . SYNCOPE AND COLLAPSE 05/24/2010  . SHORTNESS OF BREATH 05/24/2010  . SNORING 05/24/2010    Gabriela Eves, PT, DPT 02/06/2020, 12:41 PM  [facility] [address] [place], [place], [address] Phone: [phone]   Fax:  [phone]  Name: [name] [name] MRN: [id number] Date of Birth: 10-03-1946

## 2020-02-10 ENCOUNTER — Encounter: Payer: Self-pay | Admitting: Physical Therapy

## 2020-02-10 ENCOUNTER — Other Ambulatory Visit: Payer: Self-pay

## 2020-02-10 ENCOUNTER — Ambulatory Visit: Payer: Medicare Other | Attending: Orthopedic Surgery | Admitting: Physical Therapy

## 2020-02-10 DIAGNOSIS — R262 Difficulty in walking, not elsewhere classified: Secondary | ICD-10-CM | POA: Diagnosis present

## 2020-02-10 DIAGNOSIS — M25652 Stiffness of left hip, not elsewhere classified: Secondary | ICD-10-CM | POA: Diagnosis present

## 2020-02-10 DIAGNOSIS — M25552 Pain in left hip: Secondary | ICD-10-CM

## 2020-02-10 NOTE — Therapy (Signed)
[facility] [place], [place], [address] Phone: [phone]   Fax:  [phone]  Physical Therapy Treatment  Patient Details  Name: [name] [name] MRN: [id number] Date of Birth: 1946-07-30 Referring Provider (PT): Rod Can, MD   Encounter Date: 02/10/2020  PT End of Session - 02/10/20 1326    Visit Number  5    Number of Visits  8    Date for PT Re-Evaluation  03/02/20    Authorization Type  Progress note every 10th visit; KX modifier at 15th visit    PT Start Time  0100    PT Stop Time  0141    PT Time Calculation (min)  41 min    Activity Tolerance  Patient tolerated treatment well    Behavior During Therapy  [facility] for tasks assessed/performed       Past Medical History:  Diagnosis Date  . Abscess of right hip 04/11/2012  . Arthritis    back  . Avascular necrosis of right femoral head ([place]) 11/26/2012  . Chronic back pain   . Coronary artery disease 2009   Single-vessel coronary artery disease nonobstructive noted on cardiac cath   . Depression   . Gastric ulcer   . GERD (gastroesophageal reflux disease)   . History of anemia   . History of crack cocaine use   . History of kidney stones   . History of staph infection    after hip replacement  . Hyperlipidemia   . Hypertension    lost weight and has not needed meds.  . Medial epicondylitis   . Nephrolithiasis   . Neuropathy   . Pneumonia 2017  . RBBB 07/19/2016   noted on EKG  . Skin cancer    20 removed  . Spinal stenosis     Past Surgical History:  Procedure Laterality Date  . APPENDECTOMY    . CARDIAC CATHETERIZATION  2009  . CHOLECYSTECTOMY    . HEMORROIDECTOMY    . I & D EXTREMITY  04/14/2012   Procedure: IRRIGATION AND DEBRIDEMENT EXTREMITY;  Surgeon: Johnny Bridge, MD;  Location: [place];  Service: Orthopedics;  Laterality: Right;  I & D Hip  . TOTAL HIP ARTHROPLASTY Right 11/26/2012   Procedure: TOTAL HIP ARTHROPLASTY;  Surgeon: Johnny Bridge, MD;  Location: [place];  Service: Orthopedics;  Laterality: Right;  . TOTAL HIP ARTHROPLASTY Left 08/22/2019   Procedure: TOTAL HIP ARTHROPLASTY ANTERIOR APPROACH;  Surgeon: Rod Can, MD;  Location: [facility];  Service: Orthopedics;  Laterality: Left;    There were no vitals filed for this visit.  Subjective Assessment - 02/10/20 1303    Subjective  COVID-19 screening performed upon arrival. Patient arrived with les pain today yet ongoing pain with movement    Pertinent History  Left THA 08/22/2019, neuropathy    Limitations  Sitting;Walking;House hold activities;Standing    Diagnostic tests  x-ray: normal, THA intact    Patient Stated Goals  walk better    Currently in Pain?  Yes    Pain Score  5     Pain Location  Hip    Pain Orientation  Left    Pain Descriptors / Indicators  Sore    Pain Type  Acute pain    Pain Onset  1 to 4 weeks ago    Pain Frequency  Intermittent    Aggravating Factors   certain movements    Pain Relieving Factors  at rest  [place] Adult PT Treatment/Exercise - 02/10/20 0001      Knee/Hip Exercises: Aerobic   Nustep  L3 x 38min UE/LE monitored      Knee/Hip Exercises: Standing   Heel Raises  Both;1 set;10 reps    Hip Abduction  Stengthening;Left;2 sets;10 reps;Knee straight    Hip Extension  Stengthening;Left;2 sets;10 reps;Knee straight    Lateral Step Up  Left;2 sets;10 reps;Step Height: 2"    Forward Step Up  Left;2 sets;10 reps;Step Height: 2"      Knee/Hip Exercises: Seated   Sit to Sand  10 reps;with UE support   elevated surface     Knee/Hip Exercises: Supine   Hip Adduction Isometric  Strengthening;Both   x31min with grey ball     Cryotherapy   Number Minutes Cryotherapy  10 Minutes    Cryotherapy Location  Hip    Type of Cryotherapy  Ice pack      Electrical Stimulation   Electrical Stimulation Location  left hip    Electrical Stimulation Action  premod    Electrical Stimulation  Parameters  1-10hz  x 63min    Electrical Stimulation Goals  Pain      Manual Therapy   Manual Therapy  --    Soft tissue mobilization  --                  PT Long Term Goals - 02/10/20 1335      PT LONG TERM GOAL #1   Title  Patient will be independent with HEP    Time  4    Period  Weeks    Status  On-going      PT LONG TERM GOAL #2   Title  Patient will report ability to perform ADLs and home task with left hip pain less than or equal to 3/10    Time  4    Period  Weeks    Status  On-going   5/10 or more per reported 02/10/20     PT LONG TERM GOAL #3   Title  Patient will demonstrate 4/5 or greater left hip MMT in all planes to improve stability during functional tasks.    Time  4    Period  Weeks    Status  On-going   NT 02/10/20           Plan - 02/10/20 1336    Clinical Impression Statement  Patient tolerated treatment well today due to less pain. Patient able to progress with exercises with no increased hip pain. Patient did have some muscle spasms in back which limited some activities attempted. Patient feels 50% overall improvement. Current goals ongoing due to strength and pain deficits.    Personal Factors and Comorbidities  Comorbidity 2    Comorbidities  left THA anterior approach 08/22/2019; neuropathy    Examination-Activity Limitations  Bed Mobility;Locomotion Level;Transfers;Stairs;Stand;Sit    Stability/Clinical Decision Making  Stable/Uncomplicated    Rehab Potential  Good    PT Frequency  2x / week    PT Duration  4 weeks    PT Treatment/Interventions  ADLs/Self Care Home Management;Gait training;Stair training;Functional mobility training;Moist Heat;Therapeutic activities;Therapeutic exercise;Cryotherapy;Electrical Stimulation;Balance training;Neuromuscular re-education;Manual techniques;Passive range of motion;Patient/family education    PT Next Visit Plan  cont with nustep, gentle hip strengthening, gait and stair training, modalties PRN  for pain relief    Consulted and Agree with Plan of Care  Patient       Patient will benefit from skilled therapeutic intervention in order to  improve the following deficits and impairments:  Abnormal gait, Difficulty walking, Decreased range of motion, Decreased activity tolerance, Decreased strength, Pain, Decreased balance  Visit Diagnosis: Pain in left hip  Stiffness of left hip, not elsewhere classified  Difficulty in walking, not elsewhere classified     Problem List Patient Active Problem List   Diagnosis Date Noted  . Avascular necrosis of bone of left hip ([place]) 08/22/2019  . Avascular necrosis of hip, left ([place]) 08/22/2019  . Guaiac positive stools 02/21/2017  . Anemia 07/15/2015  . Leukocytosis 07/15/2015  . Sepsis ([place]) 07/14/2015  . Weakness 07/14/2015  . Essential hypertension 07/14/2015  . CAP (community acquired pneumonia) 07/12/2015  . Hypertension 02/16/2014  . Acute encephalopathy 02/16/2014  . Acute sinusitis 02/16/2014  . TIA (transient ischemic attack) 02/16/2014  . Acute kidney injury ([place]) 02/16/2014  . Avascular necrosis of right femoral head ([place]) 11/26/2012  . Abscess of right hip 04/11/2012  . ALCOHOL ABUSE 05/24/2010  . SYNCOPE AND COLLAPSE 05/24/2010  . SHORTNESS OF BREATH 05/24/2010  . SNORING 05/24/2010    Teagan Heidrick P, PTA 02/10/2020, 1:45 PM  [facility] [address] [place], [place], [address] Phone: [phone]   Fax:  [phone]  Name: ZAVON GAUSMAN MRN: [id number] Date of Birth: 12-18-1945

## 2020-02-13 ENCOUNTER — Other Ambulatory Visit: Payer: Self-pay

## 2020-02-13 ENCOUNTER — Encounter: Payer: Self-pay | Admitting: Physical Therapy

## 2020-02-13 ENCOUNTER — Ambulatory Visit: Payer: Medicare Other | Admitting: Physical Therapy

## 2020-02-13 DIAGNOSIS — M25552 Pain in left hip: Secondary | ICD-10-CM

## 2020-02-13 DIAGNOSIS — R262 Difficulty in walking, not elsewhere classified: Secondary | ICD-10-CM

## 2020-02-13 DIAGNOSIS — M25652 Stiffness of left hip, not elsewhere classified: Secondary | ICD-10-CM

## 2020-02-13 NOTE — Therapy (Signed)
[facility] [place], [place], [address] Phone: [phone]   Fax:  [phone]  Physical Therapy Treatment  Patient Details  Name: [name] [name] MRN: [id number] Date of Birth: 08-May-1946 Referring Provider (PT): Rod Can, MD   Encounter Date: 02/13/2020  PT End of Session - 02/13/20 1306    Visit Number  6    Number of Visits  8    Date for PT Re-Evaluation  03/02/20    Authorization Type  Progress note every 10th visit; KX modifier at 15th visit    PT Start Time  1300    PT Stop Time  1348    PT Time Calculation (min)  48 min    Activity Tolerance  Patient tolerated treatment well    Behavior During Therapy  [facility] for tasks assessed/performed       Past Medical History:  Diagnosis Date  . Abscess of right hip 04/11/2012  . Arthritis    back  . Avascular necrosis of right femoral head ([place]) 11/26/2012  . Chronic back pain   . Coronary artery disease 2009   Single-vessel coronary artery disease nonobstructive noted on cardiac cath   . Depression   . Gastric ulcer   . GERD (gastroesophageal reflux disease)   . History of anemia   . History of crack cocaine use   . History of kidney stones   . History of staph infection    after hip replacement  . Hyperlipidemia   . Hypertension    lost weight and has not needed meds.  . Medial epicondylitis   . Nephrolithiasis   . Neuropathy   . Pneumonia 2017  . RBBB 07/19/2016   noted on EKG  . Skin cancer    20 removed  . Spinal stenosis     Past Surgical History:  Procedure Laterality Date  . APPENDECTOMY    . CARDIAC CATHETERIZATION  2009  . CHOLECYSTECTOMY    . HEMORROIDECTOMY    . I & D EXTREMITY  04/14/2012   Procedure: IRRIGATION AND DEBRIDEMENT EXTREMITY;  Surgeon: Johnny Bridge, MD;  Location: [place];  Service: Orthopedics;  Laterality: Right;  I & D Hip  . TOTAL HIP ARTHROPLASTY Right 11/26/2012   Procedure: TOTAL HIP ARTHROPLASTY;  Surgeon: Johnny Bridge, MD;  Location: [place];  Service: Orthopedics;  Laterality: Right;  . TOTAL HIP ARTHROPLASTY Left 08/22/2019   Procedure: TOTAL HIP ARTHROPLASTY ANTERIOR APPROACH;  Surgeon: Rod Can, MD;  [facility];  Service: Orthopedics;  Laterality: Left;    There were no vitals filed for this visit.  Subjective Assessment - 02/13/20 1305    Subjective  COVID-19 screening performed upon arrival. Patient arrived with 8/10 pain. Reports had to help his nephew with car work and that got him sore    Pertinent History  Left THA 08/22/2019, neuropathy    Limitations  Sitting;Walking;House hold activities;Standing    Diagnostic tests  x-ray: normal, THA intact    Patient Stated Goals  walk better    Currently in Pain?  Yes    Pain Score  8     Pain Location  Hip    Pain Orientation  Left    Pain Descriptors / Indicators  Sore    Pain Type  Acute pain    Pain Onset  1 to 4 weeks ago    Pain Frequency  Intermittent         [facility] PT Assessment - 02/13/20 0001  Assessment   Medical Diagnosis  Trochanteric bursitis of left hip    Referring Provider (PT)  Rod Can, MD    Next MD Visit  n/a    Prior Therapy  home health for left THA      Precautions   Precautions  Anterior Hip      Restrictions   Weight Bearing Restrictions  No                   [facility] Adult PT Treatment/Exercise - 02/13/20 0001      Exercises   Exercises  Knee/Hip      Knee/Hip Exercises: Aerobic   Nustep  L4 x 70min UE/LE monitored      Knee/Hip Exercises: Standing   Heel Raises  Both;2 sets;10 reps    Hip Abduction  Stengthening;Left;2 sets;10 reps;Knee straight    Hip Extension  Stengthening;Left;2 sets;10 reps;Knee straight    Lateral Step Up  Left;2 sets;10 reps;Step Height: 2"    Forward Step Up  Left;2 sets;10 reps;Step Height: 2"      Knee/Hip Exercises: Seated   Clamshell with TheraBand  Yellow   x20   Hamstring Curl  Strengthening;Left;2 sets;10 reps    Hamstring  Limitations  yellow theraband    Sit to Sand  2 sets;5 reps;without UE support;with UE support   one set with UE support, one set without UE support     Cryotherapy   Number Minutes Cryotherapy  10 Minutes    Cryotherapy Location  Hip    Type of Cryotherapy  Ice pack      Electrical Stimulation   Electrical Stimulation Location  left hip    Electrical Stimulation Action  pre-mod    Electrical Stimulation Parameters  1-10 hz x10 min    Electrical Stimulation Goals  Pain                  PT Long Term Goals - 02/10/20 1335      PT LONG TERM GOAL #1   Title  Patient will be independent with HEP    Time  4    Period  Weeks    Status  On-going      PT LONG TERM GOAL #2   Title  Patient will report ability to perform ADLs and home task with left hip pain less than or equal to 3/10    Time  4    Period  Weeks    Status  On-going   5/10 or more per reported 02/10/20     PT LONG TERM GOAL #3   Title  Patient will demonstrate 4/5 or greater left hip MMT in all planes to improve stability during functional tasks.    Time  4    Period  Weeks    Status  On-going   NT 02/10/20           Plan - 02/13/20 1522    Clinical Impression Statement  Patient responded well to therapy session and was able to complete exercises with no significant increase of pain. Patient demonstrated good form with all exercises after explanation and demonstration. No adverse affects upon removal.    Personal Factors and Comorbidities  Comorbidity 2    Comorbidities  left THA anterior approach 08/22/2019; neuropathy    Examination-Activity Limitations  Bed Mobility;Locomotion Level;Transfers;Stairs;Stand;Sit    Stability/Clinical Decision Making  Stable/Uncomplicated    Clinical Decision Making  Low    Rehab Potential  Good    PT Frequency  2x /  week    PT Duration  4 weeks    PT Treatment/Interventions  ADLs/Self Care Home Management;Gait training;Stair training;Functional mobility  training;Moist Heat;Therapeutic activities;Therapeutic exercise;Cryotherapy;Electrical Stimulation;Balance training;Neuromuscular re-education;Manual techniques;Passive range of motion;Patient/family education    PT Next Visit Plan  cont with nustep, gentle hip strengthening, gait and stair training, modalties PRN for pain relief    PT Home Exercise Plan  see patient education section    Consulted and Agree with Plan of Care  Patient       Patient will benefit from skilled therapeutic intervention in order to improve the following deficits and impairments:  Abnormal gait, Difficulty walking, Decreased range of motion, Decreased activity tolerance, Decreased strength, Pain, Decreased balance  Visit Diagnosis: Pain in left hip  Stiffness of left hip, not elsewhere classified  Difficulty in walking, not elsewhere classified     Problem List Patient Active Problem List   Diagnosis Date Noted  . Avascular necrosis of bone of left hip ([place]) 08/22/2019  . Avascular necrosis of hip, left ([place]) 08/22/2019  . Guaiac positive stools 02/21/2017  . Anemia 07/15/2015  . Leukocytosis 07/15/2015  . Sepsis ([place]) 07/14/2015  . Weakness 07/14/2015  . Essential hypertension 07/14/2015  . CAP (community acquired pneumonia) 07/12/2015  . Hypertension 02/16/2014  . Acute encephalopathy 02/16/2014  . Acute sinusitis 02/16/2014  . TIA (transient ischemic attack) 02/16/2014  . Acute kidney injury ([place]) 02/16/2014  . Avascular necrosis of right femoral head ([place]) 11/26/2012  . Abscess of right hip 04/11/2012  . ALCOHOL ABUSE 05/24/2010  . SYNCOPE AND COLLAPSE 05/24/2010  . SHORTNESS OF BREATH 05/24/2010  . SNORING 05/24/2010    Gabriela Eves, PT, DPT 02/13/2020, 5:18 PM  [facility] [address] [place], [place], [address] Phone: [phone]   Fax:  [phone]  Name: POLK PHONG MRN: [id number] Date of Birth: 1946/08/03

## 2020-02-18 ENCOUNTER — Other Ambulatory Visit: Payer: Self-pay

## 2020-02-18 ENCOUNTER — Ambulatory Visit: Payer: Medicare Other | Admitting: *Deleted

## 2020-02-18 ENCOUNTER — Encounter: Payer: Self-pay | Admitting: *Deleted

## 2020-02-18 DIAGNOSIS — M25552 Pain in left hip: Secondary | ICD-10-CM | POA: Diagnosis not present

## 2020-02-18 DIAGNOSIS — M25652 Stiffness of left hip, not elsewhere classified: Secondary | ICD-10-CM

## 2020-02-18 DIAGNOSIS — R262 Difficulty in walking, not elsewhere classified: Secondary | ICD-10-CM

## 2020-02-18 NOTE — Therapy (Addendum)
[facility] [place], [place], [address] Phone: [phone]   Fax:  [phone]  Physical Therapy Treatment PHYSICAL THERAPY DISCHARGE SUMMARY  Visits from Start of Care: 7  Current functional level related to goals / functional outcomes: See below   Remaining deficits: See goals   Education / Equipment: HEP Plan: Patient agrees to discharge.  Patient goals were not met. Patient is being discharged due to not returning since the last visit.  ?????      Patient Details  Name: [name] [name] MRN: [id number] Date of Birth: 10-14-1945 Referring Provider (PT): Rod Can, MD   Encounter Date: 02/18/2020  PT End of Session - 02/18/20 1321    Visit Number  7    Number of Visits  8    Date for PT Re-Evaluation  03/02/20    Authorization Type  Progress note every 10th visit; KX modifier at 15th visit    PT Start Time  1300    PT Stop Time  1348    PT Time Calculation (min)  48 min       Past Medical History:  Diagnosis Date  . Abscess of right hip 04/11/2012  . Arthritis    back  . Avascular necrosis of right femoral head ([place]) 11/26/2012  . Chronic back pain   . Coronary artery disease 2009   Single-vessel coronary artery disease nonobstructive noted on cardiac cath   . Depression   . Gastric ulcer   . GERD (gastroesophageal reflux disease)   . History of anemia   . History of crack cocaine use   . History of kidney stones   . History of staph infection    after hip replacement  . Hyperlipidemia   . Hypertension    lost weight and has not needed meds.  . Medial epicondylitis   . Nephrolithiasis   . Neuropathy   . Pneumonia 2017  . RBBB 07/19/2016   noted on EKG  . Skin cancer    20 removed  . Spinal stenosis     Past Surgical History:  Procedure Laterality Date  . APPENDECTOMY    . CARDIAC CATHETERIZATION  2009  . CHOLECYSTECTOMY    . HEMORROIDECTOMY    . I & D EXTREMITY  04/14/2012    Procedure: IRRIGATION AND DEBRIDEMENT EXTREMITY;  Surgeon: Johnny Bridge, MD;  Location: [place];  Service: Orthopedics;  Laterality: Right;  I & D Hip  . TOTAL HIP ARTHROPLASTY Right 11/26/2012   Procedure: TOTAL HIP ARTHROPLASTY;  Surgeon: Johnny Bridge, MD;  Location: [place];  Service: Orthopedics;  Laterality: Right;  . TOTAL HIP ARTHROPLASTY Left 08/22/2019   Procedure: TOTAL HIP ARTHROPLASTY ANTERIOR APPROACH;  Surgeon: Rod Can, MD;  [facility];  Service: Orthopedics;  Laterality: Left;    There were no vitals filed for this visit.  Subjective Assessment - 02/18/20 1300    Subjective  COVID-19 screening performed upon arrival    Pertinent History  Left THA 08/22/2019, neuropathy    Limitations  Sitting;Walking;House hold activities;Standing    Patient Stated Goals  walk better    Currently in Pain?  Yes    Pain Score  8     Pain Location  Hip    Pain Orientation  Left    Pain Descriptors / Indicators  Sore    Pain Type  Acute pain    Pain Onset  1 to 4 weeks ago  [place] Adult PT Treatment/Exercise - 02/18/20 0001      Exercises   Exercises  Knee/Hip      Knee/Hip Exercises: Aerobic   Nustep  L4 x 24mn UE/LE monitored      Knee/Hip Exercises: Standing   Heel Raises  --    Hip Abduction  --    Lateral Step Up  --    Forward Step Up  --   attepted, but dc due to pain   Rocker Board  3 minutes   PF/ DF     Knee/Hip Exercises: Seated   Ball Squeeze  x10 hold 10secs    Clamshell with TheraBand  Red   x20   Hamstring Curl  --    Hamstring Limitations  --    Sit to Sand  2 sets;5 reps;without UE support;with UE support      Cryotherapy   Number Minutes Cryotherapy  10 Minutes    Cryotherapy Location  Hip    Type of Cryotherapy  Ice pack      Electrical Stimulation   Electrical Stimulation Location  left hip    Electrical Stimulation Action  premod    Electrical Stimulation Parameters  1-10 hz x 15 mins     Electrical Stimulation Goals  Pain                  PT Long Term Goals - 02/10/20 1335      PT LONG TERM GOAL #1   Title  Patient will be independent with HEP    Time  4    Period  Weeks    Status  On-going      PT LONG TERM GOAL #2   Title  Patient will report ability to perform ADLs and home task with left hip pain less than or equal to 3/10    Time  4    Period  Weeks    Status  On-going   5/10 or more per reported 02/10/20     PT LONG TERM GOAL #3   Title  Patient will demonstrate 4/5 or greater left hip MMT in all planes to improve stability during functional tasks.    Time  4    Period  Weeks    Status  On-going   NT 02/10/20           Plan - 02/18/20 1315    Clinical Impression Statement  Pt arrived today not doing as well due to intermittent shooting pain in LT hip. He was able to perform select exs today  due to pain. Unable to perform step ups and other standing exs, but did ok with sit to stand and some sitting exs. Normal modality response with decreased pain after Rx    Personal Factors and Comorbidities  Comorbidity 2    Comorbidities  left THA anterior approach 08/22/2019; neuropathy    Examination-Activity Limitations  Bed Mobility;Locomotion Level;Transfers;Stairs;Stand;Sit    Stability/Clinical Decision Making  Stable/Uncomplicated    Rehab Potential  Good    PT Frequency  2x / week    PT Duration  4 weeks    PT Treatment/Interventions  ADLs/Self Care Home Management;Gait training;Stair training;Functional mobility training;Moist Heat;Therapeutic activities;Therapeutic exercise;Cryotherapy;Electrical Stimulation;Balance training;Neuromuscular re-education;Manual techniques;Passive range of motion;Patient/family education    PT Next Visit Plan  cont with nustep, gentle hip strengthening, gait and stair training, modalties PRN for pain relief.      Recert or DC    Consulted and Agree with Plan of Care  Patient  Patient will benefit from  skilled therapeutic intervention in order to improve the following deficits and impairments:  Abnormal gait, Difficulty walking, Decreased range of motion, Decreased activity tolerance, Decreased strength, Pain, Decreased balance  Visit Diagnosis: Pain in left hip  Stiffness of left hip, not elsewhere classified  Difficulty in walking, not elsewhere classified     Problem List Patient Active Problem List   Diagnosis Date Noted  . Avascular necrosis of bone of left hip ([place]) 08/22/2019  . Avascular necrosis of hip, left ([place]) 08/22/2019  . Guaiac positive stools 02/21/2017  . Anemia 07/15/2015  . Leukocytosis 07/15/2015  . Sepsis ([place]) 07/14/2015  . Weakness 07/14/2015  . Essential hypertension 07/14/2015  . CAP (community acquired pneumonia) 07/12/2015  . Hypertension 02/16/2014  . Acute encephalopathy 02/16/2014  . Acute sinusitis 02/16/2014  . TIA (transient ischemic attack) 02/16/2014  . Acute kidney injury ([place]) 02/16/2014  . Avascular necrosis of right femoral head ([place]) 11/26/2012  . Abscess of right hip 04/11/2012  . ALCOHOL ABUSE 05/24/2010  . SYNCOPE AND COLLAPSE 05/24/2010  . SHORTNESS OF BREATH 05/24/2010  . SNORING 05/24/2010    Hubert Derstine,CHRIS, PTA 02/18/2020, 2:16 PM  [facility] [place], [place], [address] Phone: [phone]   Fax:  [phone]  Name: ARTHOR GORTER MRN: [id number] Date of Birth: 03/12/1946

## 2020-02-20 ENCOUNTER — Other Ambulatory Visit: Payer: Self-pay

## 2020-02-20 ENCOUNTER — Ambulatory Visit: Payer: Medicare Other | Admitting: Physical Therapy

## 2020-03-12 ENCOUNTER — Telehealth: Payer: Self-pay | Admitting: Urology

## 2020-03-12 NOTE — Telephone Encounter (Signed)
Pt called and LM stating he needed an appointment for swollen testicle. I called him back and got no answer, I left a VM.

## 2020-03-26 NOTE — Progress Notes (Signed)
Subjective: 1. Pain in left testicle   2. Encysted hydrocele      [name] [name] is a 75 yo male sent for evaluation of right > left testicular pain and left scrotal swelling.  I saw him in 5/20 for a left hydrocele that had been confirmed by [place].   He has had the symptoms for a couple of months.  He has increased pain if he bends over.   He was given levaquin on 5/20 but he didn't take it because of concern about photosensitivity.  He has had no prior similar issues.   He has a history of BPH with BOO and is on tamsulosin.  He has a reduced stream and nocturia 3-4x.   He has had no hematuria or dysuria.  He has had no stones or UTI's.  He has had no GU surgery.   He takes Ibuprofen up to 860m TID for his back pain.  He has occasional sciatica.  He has spinal stenosis.   He had a CT AP in 2016.   He is on chronic narcotics for back pain.  He had a left hip replacement in 11/12 and his UA then had 11-20 WBC.  He didn't have a culture but was given antibiotics for the procedure.    ROS:  ROS  Allergies  Allergen Reactions  . Ambien [Zolpidem Tartrate]     hallucination  . Cyanocobalamin Nausea Only  . Vitamin B12 Nausea Only    Past Medical History:  Diagnosis Date  . Abscess of right hip 04/11/2012  . Arthritis    back  . Arthritis   . [place]  . Chronic back pain   . Coronary artery disease 2009   Single-vessel coronary artery disease nonobstructive noted on cardiac cath   . Depression   . Gastric ulcer   . GERD (gastroesophageal reflux disease)   . History of anemia   . History of crack cocaine use   . History of kidney stones   . History of staph infection    after hip replacement  . Hyperlipidemia   . Hypertension    lost weight and has not needed meds.  . Medial epicondylitis   . Nephrolithiasis   . Neuropathy   . Pneumonia 2017  . RBBB 07/19/2016   noted on EKG  . Skin cancer    20 removed  . Spinal stenosis      Past Surgical History:  Procedure Laterality Date  . APPENDECTOMY    . CARDIAC CATHETERIZATION  2009  . CHOLECYSTECTOMY    . HEMORROIDECTOMY    . I & D EXTREMITY  04/14/2012   Procedure: IRRIGATION AND DEBRIDEMENT EXTREMITY;  Surgeon: JJohnny Bridge MD;  [place] [facility]  Service: Orthopedics;  Laterality: Right;  I & D Hip  . TOTAL HIP ARTHROPLASTY Right 11/26/2012   Procedure: TOTAL HIP ARTHROPLASTY;  Surgeon: JJohnny Bridge MD;  [facility] [facility]  Service: Orthopedics;  Laterality: Right;  . TOTAL HIP ARTHROPLASTY Left 08/22/2019   Procedure: TOTAL HIP ARTHROPLASTY ANTERIOR APPROACH;  Surgeon: SRod Can MD;  Location: [facility];  Service: Orthopedics;  Laterality: Left;    Social History   Socioeconomic History  . Marital status: Married    Spouse name: Not on file  . Number of children: 1  . Years of education: Not on file  . Highest education level: Not on file  Occupational History  . Occupation: retired  Tobacco Use  . Smoking  status: Never Smoker  . Smokeless tobacco: Never Used  Vaping Use  . Vaping Use: Never used  Substance and Sexual Activity  . Alcohol use: No    Comment: stopped 5 year ago  . Drug use: Not Currently    Types: "Crack" cocaine    Comment: age 28 did cocain. None since then  . Sexual activity: Never    Birth control/protection: None  Other Topics Concern  . Not on file  Social History Narrative  . Not on file   Social Determinants of Health   Financial Resource Strain:   . Difficulty of Paying Living Expenses:   Food Insecurity:   . Worried About Charity fundraiser in the Last Year:   . Arboriculturist in the Last Year:   Transportation Needs:   . Film/video editor (Medical):   Marland Kitchen Lack of Transportation (Non-Medical):   Physical Activity:   . Days of Exercise per Week:   . Minutes of Exercise per Session:   Stress:   . Feeling of Stress :   Social Connections:   . Frequency of Communication with Friends and Family:    . Frequency of Social Gatherings with Friends and Family:   . Attends Religious Services:   . Active Member of Clubs or Organizations:   . Attends Archivist Meetings:   Marland Kitchen Marital Status:   Intimate Partner Violence:   . Fear of Current or Ex-Partner:   . Emotionally Abused:   Marland Kitchen Physically Abused:   . Sexually Abused:     Family History  Problem Relation Age of Onset  . Heart disease Father   . Prostate cancer Father     Anti-infectives: Anti-infectives (From admission, onward)   None      Current Outpatient Medications  Medication Sig Dispense Refill  . baclofen (LIORESAL) 10 MG tablet Take 10 mg by mouth at bedtime.    . bisacodyl (DULCOLAX) 5 MG EC tablet Take 5 mg by mouth daily.    . celecoxib (CELEBREX) 200 MG capsule Take 1 capsule (200 mg total) by mouth daily. 30 capsule 0  . ergocalciferol (VITAMIN D2) 1.25 MG (50000 UT) capsule Take 50,000 Units by mouth every Tuesday.    . gabapentin (NEURONTIN) 300 MG capsule Take 300 mg by mouth 3 (three) times daily.    Marland Kitchen HYDROmorphone (DILAUDID) 2 MG tablet Take 3 tablets (6 mg total) by mouth every 4 (four) hours as needed for severe pain. 126 tablet 0  . morphine (MS CONTIN) 15 MG 12 hr tablet morphine ER 15 mg tablet,extended release    . naloxone (NARCAN) 4 MG/0.1ML LIQD nasal spray kit Narcan 4 mg/actuation nasal spray    . omeprazole (PRILOSEC) 20 MG capsule Take 20 mg by mouth daily.    . ondansetron (ZOFRAN) 4 MG tablet Take 1 tablet (4 mg total) by mouth every 6 (six) hours as needed for nausea. 20 tablet 0  . oxycodone (ROXICODONE) 30 MG immediate release tablet Take 30 mg by mouth every 4 (four) hours as needed for pain.    . tamsulosin (FLOMAX) 0.4 MG CAPS capsule Take 0.4 mg by mouth at bedtime.      No current facility-administered medications for this visit.     Objective: Vital signs in last 24 hours: BP 118/74   Pulse 81   Temp (!) 97.5 F (36.4 C)   Ht 5' 9"  (1.753 m)   Wt 200 lb (90.7  kg)   BMI 29.53 kg/m  Intake/Output from previous day: No intake/output data recorded. Intake/Output this shift: @IOTHISSHIFT @   Physical Exam Vitals reviewed.  Constitutional:      Appearance: Normal appearance.  Abdominal:     General: Abdomen is flat.     Palpations: Abdomen is soft.     Hernia: No hernia is present.  Genitourinary:    Comments: Nl uncirc Phallus with adequate meatus. Scrotum normal. Testes normal. Right epididymis.  Enlarged tender head. Left epididymis slightly enlarged and mildly tender. Left soft hydrocele. Neurological:     Mental Status: He is alert.     Lab Results:  Results for orders placed or performed in visit on 03/27/20 (from the past 24 hour(s))  POCT urinalysis dipstick     Status: Abnormal   Collection Time: 03/27/20 10:38 AM  Result Value Ref Range   Color, UA dk yellow    Clarity, UA clear    Glucose, UA Negative Negative   Bilirubin, UA large    Ketones, UA neg    Spec Grav, UA >=1.030 (A) 1.010 - 1.025   Blood, UA neg    pH, UA 6.5 5.0 - 8.0   Protein, UA Positive (A) Negative   Urobilinogen, UA negative (A) 0.2 or 1.0 E.U./dL   Nitrite, UA neg    Leukocytes, UA Negative Negative   Appearance     Odor        Studies/Results: Prior records and old CT report reviewed. Labs from Epic and UA today reviewed.    Assessment/Plan: Right > left testicular pain with possible epididymitis.   I will send keflex since I don't see that it is associated with photosensitivity.  Left hydrocele.   I will get a repeat US.  He will return in about a month.   No orders of the defined types were placed in this encounter.    Orders Placed This Encounter  Procedures  . POCT urinalysis dipstick     No follow-ups on file.    CC: Sharlyne Cai NP.      Irine Seal 03/27/2020 [phone]

## 2020-03-27 ENCOUNTER — Other Ambulatory Visit: Payer: Self-pay

## 2020-03-27 ENCOUNTER — Ambulatory Visit: Payer: Medicare Other | Admitting: Urology

## 2020-03-27 ENCOUNTER — Encounter: Payer: Self-pay | Admitting: Urology

## 2020-03-27 VITALS — BP 118/74 | HR 81 | Temp 97.5°F | Ht 69.0 in | Wt 200.0 lb

## 2020-03-27 DIAGNOSIS — N50812 Left testicular pain: Secondary | ICD-10-CM | POA: Diagnosis not present

## 2020-03-27 DIAGNOSIS — N430 Encysted hydrocele: Secondary | ICD-10-CM

## 2020-03-27 LAB — POCT URINALYSIS DIPSTICK
Blood, UA: NEGATIVE
Glucose, UA: NEGATIVE
Ketones, UA: NEGATIVE
Leukocytes, UA: NEGATIVE
Nitrite, UA: NEGATIVE
Protein, UA: POSITIVE — AB
Spec Grav, UA: >=1.03 — AB (ref 1.010–1.025)
Urobilinogen, UA: NEGATIVE E.U./dL — AB
pH, UA: 6.5 (ref 5.0–8.0)

## 2020-03-27 MED ORDER — CEPHALEXIN 500 MG PO CAPS: 500.0000 mg | ORAL_CAPSULE | Freq: Three times a day (TID) | ORAL | 0 refills | Status: AC

## 2020-03-27 NOTE — Progress Notes (Signed)
Urological Symptom Review  Patient is experiencing the following symptoms: Get up at night to urinate   Review of Systems  Gastrointestinal (upper)  : Negative for upper GI symptoms  Gastrointestinal (lower) : Constipation  Constitutional : Negative for symptoms  Skin: Negative for skin symptoms  Eyes: Negative for eye symptoms  Ear/Nose/Throat : Negative for Ear/Nose/Throat symptoms  Hematologic/Lymphatic: Negative for Hematologic/Lymphatic symptoms  Cardiovascular : Leg swelling  Respiratory : Shortness of breath  Endocrine: Negative for endocrine symptoms  Musculoskeletal: Back pain Joint pain  Neurological: Negative for neurological symptoms  Psychologic: Negative for psychiatric symptoms

## 2020-04-10 ENCOUNTER — Ambulatory Visit (HOSPITAL_COMMUNITY)
Admission: RE | Admit: 2020-04-10 | Discharge: 2020-04-10 | Disposition: A | Discharge status: Home / Self Care | Payer: Medicare Other | Source: Ambulatory Visit | Attending: Urology | Admitting: Urology

## 2020-04-10 ENCOUNTER — Other Ambulatory Visit: Payer: Self-pay

## 2020-04-10 DIAGNOSIS — N430 Encysted hydrocele: Secondary | ICD-10-CM

## 2020-04-10 DIAGNOSIS — N50812 Left testicular pain: Secondary | ICD-10-CM | POA: Diagnosis not present

## 2020-04-14 ENCOUNTER — Telehealth: Payer: Self-pay

## 2020-04-14 NOTE — Telephone Encounter (Signed)
-----   Message from Irine Seal, MD sent at 04/14/2020  9:15 AM EDT ----- [place] confirms a hydrocele on the left but no epididymitis.

## 2020-04-14 NOTE — Telephone Encounter (Signed)
Pt notified of results

## 2020-04-30 NOTE — H&P (View-Only) (Signed)
Subjective: 1. Encysted hydrocele   2. Pain in right testicle   3. Pain in left testicle   4. BPH with urinary obstruction   5. Nocturia   6. Weak urinary stream      Mr. [name] [name] is a 74 yo male sent for evaluation of right > left testicular pain and left scrotal swelling.  I saw him in 5/20 for a left hydrocele that had been confirmed by [place].  A repeat US prior to this visit shows a stable left hydrocele.  He was given keflex at his last visit and that didn't help the symptoms.   He has a history of BPH with BOO and is on tamsulosin but it doesn't seem to help.  He has a reduced stream and nocturia 4-5x.   He has had no hematuria or dysuria.  He has had no stones or UTI's.  He has had no GU surgery.   He takes Ibuprofen up to 872m TID for his back pain but he is trying to cut that out because of liver issues.  He has occasional sciatica.  He has spinal stenosis.   He had a CT AP in 2016.   He is on chronic narcotics for back pain.  He had a left hip replacement in 11/12.    IPSS    Row Name 05/01/20 1100         International Prostate Symptom Score   How often have you had the sensation of not emptying your bladder? Not at All     How often have you had to urinate less than every two hours? Not at All     How often have you found you stopped and started again several times when you urinated? Not at All     How often have you found it difficult to postpone urination? Not at All     How often have you had a weak urinary stream? About half the time     How often have you had to strain to start urination? Not at All     How many times did you typically get up at night to urinate? 5 Times     Total IPSS Score 8       Quality of Life due to urinary symptoms   If you were to spend the rest of your life with your urinary condition just the way it is now how would you feel about that? Pleased             ROS:  ROS  Allergies  Allergen Reactions  . Ambien [Zolpidem Tartrate]      hallucination  . Cyanocobalamin Nausea Only  . Vitamin B12 Nausea Only    Past Medical History:  Diagnosis Date  . Abscess of right hip 04/11/2012  . Arthritis    back  . Arthritis   . [place] right [place] 11[place]  . Chronic back pain   . Coronary artery disease 2009   Single-vessel coronary artery disease nonobstructive noted on cardiac cath   . Depression   . Gastric ulcer   . GERD (gastroesophageal reflux disease)   . History of anemia   . History of crack cocaine use   . History of kidney stones   . History of staph infection    after hip replacement  . Hyperlipidemia   . Hypertension    lost weight and has not needed meds.  . Medial epicondylitis   . Nephrolithiasis   .  Neuropathy   . Pneumonia 2017  . RBBB 07/19/2016   noted on EKG  . Skin cancer    20 removed  . Spinal stenosis     Past Surgical History:  Procedure Laterality Date  . APPENDECTOMY    . CARDIAC CATHETERIZATION  2009  . CHOLECYSTECTOMY    . HEMORROIDECTOMY    . I & D EXTREMITY  04/14/2012   Procedure: IRRIGATION AND DEBRIDEMENT EXTREMITY;  Surgeon: Johnny Bridge, MD;  Location: [place];  Service: Orthopedics;  Laterality: Right;  I & D Hip  . TOTAL HIP ARTHROPLASTY Right 11/26/2012   Procedure: TOTAL HIP ARTHROPLASTY;  Surgeon: Johnny Bridge, MD;  Location: [place];  Service: Orthopedics;  Laterality: Right;  . TOTAL HIP ARTHROPLASTY Left 08/22/2019   Procedure: TOTAL HIP ARTHROPLASTY ANTERIOR APPROACH;  Surgeon: Rod Can, MD;  Location: [facility];  Service: Orthopedics;  Laterality: Left;    Social History   Socioeconomic History  . Marital status: Married    Spouse name: Not on file  . Number of children: 1  . Years of education: Not on file  . Highest education level: Not on file  Occupational History  . Occupation: retired  Tobacco Use  . Smoking status: Never Smoker  . Smokeless tobacco: Never Used  Vaping Use  . Vaping Use: Never used  Substance  and Sexual Activity  . Alcohol use: No    Comment: stopped 5 year ago  . Drug use: Not Currently    Types: "Crack" cocaine    Comment: age 43 did cocain. None since then  . Sexual activity: Never    Birth control/protection: None  Other Topics Concern  . Not on file  Social History Narrative  . Not on file   Social Determinants of Health   Financial Resource Strain:   . Difficulty of Paying Living Expenses:   Food Insecurity:   . Worried About Charity fundraiser in the Last Year:   . Arboriculturist in the Last Year:   Transportation Needs:   . Film/video editor (Medical):   Marland Kitchen Lack of Transportation (Non-Medical):   Physical Activity:   . Days of Exercise per Week:   . Minutes of Exercise per Session:   Stress:   . Feeling of Stress :   Social Connections:   . Frequency of Communication with Friends and Family:   . Frequency of Social Gatherings with Friends and Family:   . Attends Religious Services:   . Active Member of Clubs or Organizations:   . Attends Archivist Meetings:   Marland Kitchen Marital Status:   Intimate Partner Violence:   . Fear of Current or Ex-Partner:   . Emotionally Abused:   Marland Kitchen Physically Abused:   . Sexually Abused:     Family History  Problem Relation Age of Onset  . Heart disease Father   . Prostate cancer Father     Anti-infectives: Anti-infectives (From admission, onward)   None      Current Outpatient Medications  Medication Sig Dispense Refill  . baclofen (LIORESAL) 10 MG tablet Take 10 mg by mouth at bedtime.    . bisacodyl (DULCOLAX) 5 MG EC tablet Take 5 mg by mouth daily.    . ergocalciferol (VITAMIN D2) 1.25 MG (50000 UT) capsule Take 50,000 Units by mouth every Tuesday.    . gabapentin (NEURONTIN) 300 MG capsule Take 300 mg by mouth 3 (three) times daily.    Marland Kitchen morphine (MS CONTIN) 15  MG 12 hr tablet morphine ER 15 mg tablet,extended release    . naloxone (NARCAN) 4 MG/0.1ML LIQD nasal spray kit Narcan 4 mg/actuation  nasal spray    . omeprazole (PRILOSEC) 20 MG capsule Take 20 mg by mouth daily.    . ondansetron (ZOFRAN) 4 MG tablet Take 1 tablet (4 mg total) by mouth every 6 (six) hours as needed for nausea. 20 tablet 0  . oxycodone (ROXICODONE) 30 MG immediate release tablet Take 30 mg by mouth every 4 (four) hours as needed for pain.    . tamsulosin (FLOMAX) 0.4 MG CAPS capsule Take 0.4 mg by mouth at bedtime.     . traZODone (DESYREL) 100 MG tablet Take 200 mg by mouth at bedtime as needed.     No current facility-administered medications for this visit.     Objective: Vital signs in last 24 hours: BP (!) 119/64   Pulse 68   Temp 97.7 F (36.5 C)   Ht 5' 8" (1.727 m)   Wt (!) 212 lb (96.2 kg)   BMI 32.23 kg/m   Intake/Output from previous day: No intake/output data recorded. Intake/Output this shift: _0 @   Physical Exam Vitals reviewed.  Constitutional:      Appearance: Normal appearance.  Genitourinary:    Comments: He has a moderate left hydrocele with minimal testicular tenderness. He has some right epididymal enlargement with induration and tenderness.   Neurological:     Mental Status: He is alert.     Lab Results:  Results for orders placed or performed in visit on 05/01/20 (from the past 24 hour(s))  POCT urinalysis dipstick     Status: Abnormal   Collection Time: 05/01/20 11:42 AM  Result Value Ref Range   Color, UA dk yellow    Clarity, UA clear    Glucose, UA Negative Negative   Bilirubin, UA small    Ketones, UA neg    Spec Grav, UA 1.025 1.010 - 1.025   Blood, UA neg    pH, UA 5.0 5.0 - 8.0   Protein, UA Negative Negative   Urobilinogen, UA negative (A) 0.2 or 1.0 E.U./dL   Nitrite, UA neg    Leukocytes, UA Negative Negative   Appearance     Odor        Studies/Results: CLINICAL DATA:  Bilateral testicular pain for 3 weeks  EXAM: SCROTAL ULTRASOUND  DOPPLER ULTRASOUND OF THE TESTICLES  TECHNIQUE: Complete ultrasound examination of  the testicles, epididymis, and other scrotal structures was performed. Color and spectral Doppler ultrasound were also utilized to evaluate blood flow to the testicles.  COMPARISON:  None.  FINDINGS: Right testicle  Measurements: 4.7 x 2.6 x 3.1 cm. No mass or microlithiasis visualized.  Left testicle  Measurements: 4.6 x 2.3 x 2.8 cm. No mass or microlithiasis visualized.  Right epididymis:  Normal in size and appearance.  Left epididymis:  Normal in size and appearance.  Hydrocele:  Large left hydrocele is identified.  Varicocele:  Bilateral varicocele are noted.  Pulsed Doppler interrogation of both testes demonstrates normal low resistance arterial and venous waveforms bilaterally.  IMPRESSION: Normal bilateral testis.  No evidence of testicular torsion.  Large left hydrocele.   Electronically Signed   By: Abelardo Diesel M.D.   On: 04/11/2020 10:53   Assessment/Plan: Encysted hydrocele He has discomfort from the left hydrocele and would like it fixed.  He also has marked right epididymal tenderness with some enlargement and induration. I am going to set him up for  a left hydrocelectomy and right epididymectomy  and reviewed the risks of bleeding, infection, chronic pain, recurrence of the hydrocele, testicular injury, thrombotic events and anesthetic complications.   BPH with urinary obstruction He has a reduced stream with severe nocturia that has not improved with tamsulosin.  I will do cystoscopy at the time of the scrotal surgery to assess the outlet.     No orders of the defined types were placed in this encounter.    Orders Placed This Encounter  Procedures  . POCT urinalysis dipstick     Return for Next available shedule surgery. .    CC: Sharlyne Cai NP.      Irine Seal 05/01/2020 [phone]

## 2020-04-30 NOTE — Progress Notes (Signed)
Subjective: 1. Encysted hydrocele   2. Pain in right testicle   3. Pain in left testicle   4. BPH with urinary obstruction   5. Nocturia   6. Weak urinary stream      [name] [name] is a 73 yo male sent for evaluation of right > left testicular pain and left scrotal swelling.  I saw him in 5/20 for a left hydrocele that had been confirmed by US.  A repeat US prior to this visit shows a stable left hydrocele.  He was given keflex at his last visit and that didn't help the symptoms.   He has a history of BPH with BOO and is on tamsulosin but it doesn't seem to help.  He has a reduced stream and nocturia 4-5x.   He has had no hematuria or dysuria.  He has had no stones or UTI's.  He has had no GU surgery.   He takes Ibuprofen up to 800mg TID for his back pain but he is trying to cut that out because of liver issues.  He has occasional sciatica.  He has spinal stenosis.   He had a CT AP in 2016.   He is on chronic narcotics for back pain.  He had a left hip replacement in 11/12.    IPSS    Row Name 05/01/20 1100         International Prostate Symptom Score   How often have you had the sensation of not emptying your bladder? Not at All     How often have you had to urinate less than every two hours? Not at All     How often have you found you stopped and started again several times when you urinated? Not at All     How often have you found it difficult to postpone urination? Not at All     How often have you had a weak urinary stream? About half the time     How often have you had to strain to start urination? Not at All     How many times did you typically get up at night to urinate? 5 Times     Total IPSS Score 8       Quality of Life due to urinary symptoms   If you were to spend the rest of your life with your urinary condition just the way it is now how would you feel about that? Pleased             ROS:  ROS  Allergies  Allergen Reactions  . Ambien [Zolpidem Tartrate]      hallucination  . Cyanocobalamin Nausea Only  . Vitamin B12 Nausea Only    Past Medical History:  Diagnosis Date  . Abscess of right hip 04/11/2012  . Arthritis    back  . Arthritis   . Avascular necrosis of right femoral head (HCC) 11/26/2012  . Chronic back pain   . Coronary artery disease 2009   Single-vessel coronary artery disease nonobstructive noted on cardiac cath   . Depression   . Gastric ulcer   . GERD (gastroesophageal reflux disease)   . History of anemia   . History of crack cocaine use   . History of kidney stones   . History of staph infection    after hip replacement  . Hyperlipidemia   . Hypertension    lost weight and has not needed meds.  . Medial epicondylitis   . Nephrolithiasis   .   Neuropathy   . Pneumonia 2017  . RBBB 07/19/2016   noted on EKG  . Skin cancer    20 removed  . Spinal stenosis     Past Surgical History:  Procedure Laterality Date  . APPENDECTOMY    . CARDIAC CATHETERIZATION  2009  . CHOLECYSTECTOMY    . HEMORROIDECTOMY    . I & D EXTREMITY  04/14/2012   Procedure: IRRIGATION AND DEBRIDEMENT EXTREMITY;  Surgeon: Joshua P Landau, MD;  Location: [facility] OR;  Service: Orthopedics;  Laterality: Right;  I & D Hip  . TOTAL HIP ARTHROPLASTY Right 11/26/2012   Procedure: TOTAL HIP ARTHROPLASTY;  Surgeon: Joshua P Landau, MD;  Location: [facility] OR;  Service: Orthopedics;  Laterality: Right;  . TOTAL HIP ARTHROPLASTY Left 08/22/2019   Procedure: TOTAL HIP ARTHROPLASTY ANTERIOR APPROACH;  Surgeon: Swinteck, Brian, MD;  Location: [facility];  Service: Orthopedics;  Laterality: Left;    Social History   Socioeconomic History  . Marital status: Married    Spouse name: Not on file  . Number of children: 1  . Years of education: Not on file  . Highest education level: Not on file  Occupational History  . Occupation: retired  Tobacco Use  . Smoking status: Never Smoker  . Smokeless tobacco: Never Used  Vaping Use  . Vaping Use: Never used  Substance  and Sexual Activity  . Alcohol use: No    Comment: stopped 5 year ago  . Drug use: Not Currently    Types: "Crack" cocaine    Comment: age 49 did cocain. None since then  . Sexual activity: Never    Birth control/protection: None  Other Topics Concern  . Not on file  Social History Narrative  . Not on file   Social Determinants of Health   Financial Resource Strain:   . Difficulty of Paying Living Expenses:   Food Insecurity:   . Worried About Running Out of Food in the Last Year:   . Ran Out of Food in the Last Year:   Transportation Needs:   . Lack of Transportation (Medical):   . Lack of Transportation (Non-Medical):   Physical Activity:   . Days of Exercise per Week:   . Minutes of Exercise per Session:   Stress:   . Feeling of Stress :   Social Connections:   . Frequency of Communication with Friends and Family:   . Frequency of Social Gatherings with Friends and Family:   . Attends Religious Services:   . Active Member of Clubs or Organizations:   . Attends Club or Organization Meetings:   . Marital Status:   Intimate Partner Violence:   . Fear of Current or Ex-Partner:   . Emotionally Abused:   . Physically Abused:   . Sexually Abused:     Family History  Problem Relation Age of Onset  . Heart disease Father   . Prostate cancer Father     Anti-infectives: Anti-infectives (From admission, onward)   None      Current Outpatient Medications  Medication Sig Dispense Refill  . baclofen (LIORESAL) 10 MG tablet Take 10 mg by mouth at bedtime.    . bisacodyl (DULCOLAX) 5 MG EC tablet Take 5 mg by mouth daily.    . ergocalciferol (VITAMIN D2) 1.25 MG (50000 UT) capsule Take 50,000 Units by mouth every Tuesday.    . gabapentin (NEURONTIN) 300 MG capsule Take 300 mg by mouth 3 (three) times daily.    . morphine (MS CONTIN) 15   MG 12 hr tablet morphine ER 15 mg tablet,extended release    . naloxone (NARCAN) 4 MG/0.1ML LIQD nasal spray kit Narcan 4 mg/actuation  nasal spray    . omeprazole (PRILOSEC) 20 MG capsule Take 20 mg by mouth daily.    . ondansetron (ZOFRAN) 4 MG tablet Take 1 tablet (4 mg total) by mouth every 6 (six) hours as needed for nausea. 20 tablet 0  . oxycodone (ROXICODONE) 30 MG immediate release tablet Take 30 mg by mouth every 4 (four) hours as needed for pain.    . tamsulosin (FLOMAX) 0.4 MG CAPS capsule Take 0.4 mg by mouth at bedtime.     . traZODone (DESYREL) 100 MG tablet Take 200 mg by mouth at bedtime as needed.     No current facility-administered medications for this visit.     Objective: Vital signs in last 24 hours: BP (!) 119/64   Pulse 68   Temp 97.7 F (36.5 C)   Ht 5' 8" (1.727 m)   Wt (!) 212 lb (96.2 kg)   BMI 32.23 kg/m   Intake/Output from previous day: No intake/output data recorded. Intake/Output this shift: @IOTHISSHIFT@   Physical Exam Vitals reviewed.  Constitutional:      Appearance: Normal appearance.  Genitourinary:    Comments: He has a moderate left hydrocele with minimal testicular tenderness. He has some right epididymal enlargement with induration and tenderness.   Neurological:     Mental Status: He is alert.     Lab Results:  Results for orders placed or performed in visit on 05/01/20 (from the past 24 hour(s))  POCT urinalysis dipstick     Status: Abnormal   Collection Time: 05/01/20 11:42 AM  Result Value Ref Range   Color, UA dk yellow    Clarity, UA clear    Glucose, UA Negative Negative   Bilirubin, UA small    Ketones, UA neg    Spec Grav, UA 1.025 1.010 - 1.025   Blood, UA neg    pH, UA 5.0 5.0 - 8.0   Protein, UA Negative Negative   Urobilinogen, UA negative (A) 0.2 or 1.0 E.U./dL   Nitrite, UA neg    Leukocytes, UA Negative Negative   Appearance     Odor        Studies/Results: CLINICAL DATA:  Bilateral testicular pain for 3 weeks  EXAM: SCROTAL ULTRASOUND  DOPPLER ULTRASOUND OF THE TESTICLES  TECHNIQUE: Complete ultrasound examination of  the testicles, epididymis, and other scrotal structures was performed. Color and spectral Doppler ultrasound were also utilized to evaluate blood flow to the testicles.  COMPARISON:  None.  FINDINGS: Right testicle  Measurements: 4.7 x 2.6 x 3.1 cm. No mass or microlithiasis visualized.  Left testicle  Measurements: 4.6 x 2.3 x 2.8 cm. No mass or microlithiasis visualized.  Right epididymis:  Normal in size and appearance.  Left epididymis:  Normal in size and appearance.  Hydrocele:  Large left hydrocele is identified.  Varicocele:  Bilateral varicocele are noted.  Pulsed Doppler interrogation of both testes demonstrates normal low resistance arterial and venous waveforms bilaterally.  IMPRESSION: Normal bilateral testis.  No evidence of testicular torsion.  Large left hydrocele.   Electronically Signed   By: Wei-Chen  Lin M.D.   On: 04/11/2020 10:53   Assessment/Plan: Encysted hydrocele He has discomfort from the left hydrocele and would like it fixed.  He also has marked right epididymal tenderness with some enlargement and induration. I am going to set him up for   a left hydrocelectomy and right epididymectomy  and reviewed the risks of bleeding, infection, chronic pain, recurrence of the hydrocele, testicular injury, thrombotic events and anesthetic complications.   BPH with urinary obstruction He has a reduced stream with severe nocturia that has not improved with tamsulosin.  I will do cystoscopy at the time of the scrotal surgery to assess the outlet.     No orders of the defined types were placed in this encounter.    Orders Placed This Encounter  Procedures  . POCT urinalysis dipstick     Return for Next available shedule surgery. .    CC: Kirkland Dickey NP.      Tilda Samudio 05/01/2020 [phone] 

## 2020-05-01 ENCOUNTER — Other Ambulatory Visit: Payer: Self-pay

## 2020-05-01 ENCOUNTER — Encounter: Payer: Self-pay | Admitting: Urology

## 2020-05-01 ENCOUNTER — Other Ambulatory Visit (HOSPITAL_COMMUNITY): Payer: Self-pay | Admitting: Internal Medicine

## 2020-05-01 ENCOUNTER — Other Ambulatory Visit: Payer: Self-pay | Admitting: Internal Medicine

## 2020-05-01 ENCOUNTER — Ambulatory Visit (INDEPENDENT_AMBULATORY_CARE_PROVIDER_SITE_OTHER): Payer: Medicare Other | Admitting: Urology

## 2020-05-01 VITALS — BP 119/64 | HR 68 | Temp 97.7°F | Ht 68.0 in | Wt 212.0 lb

## 2020-05-01 DIAGNOSIS — R3912 Poor urinary stream: Secondary | ICD-10-CM | POA: Insufficient documentation

## 2020-05-01 DIAGNOSIS — N50811 Right testicular pain: Secondary | ICD-10-CM

## 2020-05-01 DIAGNOSIS — N430 Encysted hydrocele: Secondary | ICD-10-CM

## 2020-05-01 DIAGNOSIS — N50812 Left testicular pain: Secondary | ICD-10-CM | POA: Diagnosis not present

## 2020-05-01 DIAGNOSIS — N401 Enlarged prostate with lower urinary tract symptoms: Secondary | ICD-10-CM

## 2020-05-01 DIAGNOSIS — R7989 Other specified abnormal findings of blood chemistry: Secondary | ICD-10-CM

## 2020-05-01 DIAGNOSIS — N138 Other obstructive and reflux uropathy: Secondary | ICD-10-CM

## 2020-05-01 DIAGNOSIS — R351 Nocturia: Secondary | ICD-10-CM

## 2020-05-01 LAB — POCT URINALYSIS DIPSTICK
Blood, UA: NEGATIVE
Glucose, UA: NEGATIVE
Ketones, UA: NEGATIVE
Leukocytes, UA: NEGATIVE
Nitrite, UA: NEGATIVE
Protein, UA: NEGATIVE
Spec Grav, UA: 1.025 (ref 1.010–1.025)
Urobilinogen, UA: NEGATIVE E.U./dL — AB
pH, UA: 5 (ref 5.0–8.0)

## 2020-05-01 NOTE — Progress Notes (Signed)

## 2020-05-01 NOTE — Assessment & Plan Note (Signed)
He has discomfort from the left hydrocele and would like it fixed.  He also has marked right epididymal tenderness with some enlargement and induration. I am going to set him up for a left hydrocelectomy and right epididymectomy  and reviewed the risks of bleeding, infection, chronic pain, recurrence of the hydrocele, testicular injury, thrombotic events and anesthetic complications.

## 2020-05-01 NOTE — Assessment & Plan Note (Signed)
He has a reduced stream with severe nocturia that has not improved with tamsulosin.  I will do cystoscopy at the time of the scrotal surgery to assess the outlet.

## 2020-05-07 ENCOUNTER — Other Ambulatory Visit: Payer: Self-pay | Admitting: Urology

## 2020-05-08 ENCOUNTER — Ambulatory Visit (HOSPITAL_COMMUNITY)
Admission: RE | Admit: 2020-05-08 | Discharge: 2020-05-08 | Disposition: A | Discharge status: Home / Self Care | Payer: Medicare Other | Source: Ambulatory Visit | Attending: Internal Medicine | Admitting: Internal Medicine

## 2020-05-08 ENCOUNTER — Other Ambulatory Visit: Payer: Self-pay

## 2020-05-08 DIAGNOSIS — R7989 Other specified abnormal findings of blood chemistry: Secondary | ICD-10-CM | POA: Diagnosis not present

## 2020-05-15 NOTE — Patient Instructions (Addendum)
DUE TO COVID-19 ONLY ONE VISITOR IS ALLOWED TO COME WITH YOU AND STAY IN THE WAITING ROOM ONLY DURING PRE OP AND PROCEDURE.   IF YOU WILL BE ADMITTED INTO THE HOSPITAL YOU ARE ALLOWED ONE SUPPORT PERSON DURING VISITATION HOURS ONLY (10AM -8PM)   . The support person may change daily. . The support person must pass our screening, gel in and out, and wear a mask at all times, including in the patient's room. . Patients must also wear a mask when staff or their support person are in the room.   COVID SWAB TESTING MUST BE COMPLETED ON:  Friday, 05-22-20 @ 2:50 PM    [address] [place], [place] [address]  (Must self quarantine after testing. Follow instructions on handout.)        Your procedure is scheduled on:  Tuesday, 05-26-20   Report to [facility] Main  Entrance   Report to Short Stay at 5:30 AM   [facility])      Call this number if you have problems the morning of surgery [phone]   Do not eat food :After Midnight.     Oral Hygiene is also important to reduce your risk of infection.                                     Remember - BRUSH YOUR TEETH THE MORNING OF SURGERY WITH YOUR REGULAR TOOTHPASTE   Do NOT smoke after Midnight   Take these medicines the morning of surgery with A SIP OF WATER:  Gabapentin, Morphine, Omeprazole, Tamsulosin                                You may not have any metal on your body including  jewelry, and body piercings              Do not wear make-up, lotions, powders, perfumes/cologne, or deodorant                       Men may shave face and neck.   Do not bring valuables to the hospital. [place].   Contacts, dentures or bridgework may not be worn into surgery.      Patients discharged the day of surgery will not be allowed to drive home.                Please read over the following fact sheets you were given: IF YOU HAVE QUESTIONS ABOUT YOUR PRE OP INSTRUCTIONS PLEASE  CALL   [phone]   [place] - Preparing for Surgery Before surgery, you can play an important role.  Because skin is not sterile, your skin needs to be as free of germs as possible.  You can reduce the number of germs on your skin by washing with CHG (chlorahexidine gluconate) soap before surgery.  CHG is an antiseptic cleaner which kills germs and bonds with the skin to continue killing germs even after washing. Please DO NOT use if you have an allergy to CHG or antibacterial soaps.  If your skin becomes reddened/irritated stop using the CHG and inform your nurse when you arrive at Short Stay. Do not shave (including legs and underarms) for at least 48 hours prior to the first CHG shower.  You may shave your  face/neck.  Please follow these instructions carefully:  1.  Shower with CHG Soap the night before surgery and the  morning of surgery.  2.  If you choose to wash your hair, wash your hair first as usual with your normal  shampoo.  3.  After you shampoo, rinse your hair and body thoroughly to remove the shampoo.                             4.  Use CHG as you would any other liquid soap.  You can apply chg directly to the skin and wash.  Gently with a scrungie or clean washcloth.  5.  Apply the CHG Soap to your body ONLY FROM THE NECK DOWN.   Do   not use on face/ open                           Wound or open sores. Avoid contact with eyes, ears mouth and   genitals (private parts).                       Wash face,  Genitals (private parts) with your normal soap.             6.  Wash thoroughly, paying special attention to the area where your    surgery  will be performed.  7.  Thoroughly rinse your body with warm water from the neck down.  8.  DO NOT shower/wash with your normal soap after using and rinsing off the CHG Soap.                9.  Pat yourself dry with a clean towel.            10.  Wear clean pajamas.            11.  Place clean sheets on your bed the night of your first shower  and do not  sleep with pets. Day of Surgery : Do not apply any lotions/deodorants the morning of surgery.  Please wear clean clothes to the hospital/surgery center.  FAILURE TO FOLLOW THESE INSTRUCTIONS MAY RESULT IN THE CANCELLATION OF YOUR SURGERY  PATIENT SIGNATURE_________________________________  NURSE SIGNATURE__________________________________  ________________________________________________________________________

## 2020-05-15 NOTE — Progress Notes (Addendum)
COVID Vaccine Completed:  x2 Date COVID Vaccine completed:  Unsure of date COVID vaccine manufacturer: [employer]   PCP -  Sharlyne Cai, NP Cardiologist -  In the past last [place] 06-29-10, no futher follow-up needed  Chest x-ray - N/A EKG - 05-22-2020 Stress Test - 2011 in Epic ECHO - 2008 in [place] Cath - 2009  Sleep Study - N/A CPAP -   Fasting Blood Sugar - N/A Checks Blood Sugar _____ times a day  Blood Thinner Instructions:N/A Aspirin Instructions:N/A Last Dose:  Anesthesia review:  Patient denies shortness of breath, fever, cough and chest pain at PAT appointment   Patient verbalized understanding of instructions that were given to them at the PAT appointment. Patient was also instructed that they will need to review over the PAT instructions again at home before surgery.

## 2020-05-22 ENCOUNTER — Encounter (HOSPITAL_COMMUNITY)
Admission: RE | Admit: 2020-05-22 | Discharge: 2020-05-22 | Disposition: A | Discharge status: Home / Self Care | Payer: Medicare Other | Source: Ambulatory Visit | Attending: Urology | Admitting: Urology

## 2020-05-22 ENCOUNTER — Other Ambulatory Visit: Payer: Self-pay

## 2020-05-22 ENCOUNTER — Other Ambulatory Visit (HOSPITAL_COMMUNITY)
Admission: RE | Admit: 2020-05-22 | Discharge: 2020-05-22 | Disposition: A | Discharge status: Home / Self Care | Payer: Medicare Other | Source: Ambulatory Visit | Attending: Urology | Admitting: Urology

## 2020-05-22 ENCOUNTER — Encounter (HOSPITAL_COMMUNITY): Payer: Self-pay

## 2020-05-22 DIAGNOSIS — Z20822 Contact with and (suspected) exposure to covid-19: Secondary | ICD-10-CM | POA: Insufficient documentation

## 2020-05-22 DIAGNOSIS — Z01818 Encounter for other preprocedural examination: Secondary | ICD-10-CM | POA: Insufficient documentation

## 2020-05-22 HISTORY — DX: Headache, unspecified: R51.9

## 2020-05-22 LAB — CBC
HCT: 38.4 % — ABNORMAL LOW (ref 39.0–52.0)
Hemoglobin: 12.1 g/dL — ABNORMAL LOW (ref 13.0–17.0)
MCH: 31.3 pg (ref 26.0–34.0)
MCHC: 31.5 g/dL (ref 30.0–36.0)
MCV: 99.5 fL (ref 80.0–100.0)
Platelets: 120 10*3/uL — ABNORMAL LOW (ref 150–400)
RBC: 3.86 MIL/uL — ABNORMAL LOW (ref 4.22–5.81)
RDW: 13.4 % (ref 11.5–15.5)
WBC: 3.2 10*3/uL — ABNORMAL LOW (ref 4.0–10.5)
nRBC: 0 % (ref 0.0–0.2)

## 2020-05-22 LAB — BASIC METABOLIC PANEL
Anion gap: 7 (ref 5–15)
BUN: 21 mg/dL (ref 8–23)
CO2: 31 mmol/L (ref 22–32)
Calcium: 8.9 mg/dL (ref 8.9–10.3)
Chloride: 101 mmol/L (ref 98–111)
Creatinine, Ser: 0.89 mg/dL (ref 0.61–1.24)
GFR calc Af Amer: >60 mL/min (ref >60)
GFR calc non Af Amer: >60 mL/min (ref >60)
Glucose, Bld: 111 mg/dL — ABNORMAL HIGH (ref 70–99)
Potassium: 4.7 mmol/L (ref 3.5–5.1)
Sodium: 139 mmol/L (ref 135–145)

## 2020-05-22 LAB — SARS CORONAVIRUS 2 (TAT 6-24 HRS): SARS Coronavirus 2: NEGATIVE

## 2020-05-25 NOTE — Progress Notes (Signed)
CBC sent to Dr. Jeffie Pollock to review.

## 2020-05-26 ENCOUNTER — Encounter (HOSPITAL_COMMUNITY): Payer: Self-pay | Admitting: Urology

## 2020-05-26 ENCOUNTER — Encounter (HOSPITAL_COMMUNITY)
Admission: RE | Disposition: A | Discharge status: Home / Self Care | Payer: Self-pay | Source: Home / Self Care | Attending: Urology

## 2020-05-26 ENCOUNTER — Ambulatory Visit (HOSPITAL_COMMUNITY): Payer: Medicare Other | Admitting: Certified Registered Nurse Anesthetist

## 2020-05-26 ENCOUNTER — Other Ambulatory Visit: Payer: Self-pay

## 2020-05-26 ENCOUNTER — Ambulatory Visit (HOSPITAL_COMMUNITY)
Admission: RE | Admit: 2020-05-26 | Discharge: 2020-05-26 | Disposition: A | Discharge status: Home / Self Care | Payer: Medicare Other | Attending: Urology | Admitting: Urology

## 2020-05-26 DIAGNOSIS — Z85828 Personal history of other malignant neoplasm of skin: Secondary | ICD-10-CM | POA: Insufficient documentation

## 2020-05-26 DIAGNOSIS — E785 Hyperlipidemia, unspecified: Secondary | ICD-10-CM | POA: Diagnosis not present

## 2020-05-26 DIAGNOSIS — R3912 Poor urinary stream: Secondary | ICD-10-CM | POA: Insufficient documentation

## 2020-05-26 DIAGNOSIS — Z888 Allergy status to other drugs, medicaments and biological substances status: Secondary | ICD-10-CM | POA: Insufficient documentation

## 2020-05-26 DIAGNOSIS — G629 Polyneuropathy, unspecified: Secondary | ICD-10-CM | POA: Insufficient documentation

## 2020-05-26 DIAGNOSIS — Z79899 Other long term (current) drug therapy: Secondary | ICD-10-CM | POA: Insufficient documentation

## 2020-05-26 DIAGNOSIS — Z96643 Presence of artificial hip joint, bilateral: Secondary | ICD-10-CM | POA: Insufficient documentation

## 2020-05-26 DIAGNOSIS — I2510 Atherosclerotic heart disease of native coronary artery without angina pectoris: Secondary | ICD-10-CM | POA: Insufficient documentation

## 2020-05-26 DIAGNOSIS — Z8249 Family history of ischemic heart disease and other diseases of the circulatory system: Secondary | ICD-10-CM | POA: Insufficient documentation

## 2020-05-26 DIAGNOSIS — K219 Gastro-esophageal reflux disease without esophagitis: Secondary | ICD-10-CM | POA: Insufficient documentation

## 2020-05-26 DIAGNOSIS — F329 Major depressive disorder, single episode, unspecified: Secondary | ICD-10-CM | POA: Insufficient documentation

## 2020-05-26 DIAGNOSIS — Z79891 Long term (current) use of opiate analgesic: Secondary | ICD-10-CM | POA: Insufficient documentation

## 2020-05-26 DIAGNOSIS — N451 Epididymitis: Secondary | ICD-10-CM | POA: Diagnosis not present

## 2020-05-26 DIAGNOSIS — M1990 Unspecified osteoarthritis, unspecified site: Secondary | ICD-10-CM | POA: Insufficient documentation

## 2020-05-26 DIAGNOSIS — Z8619 Personal history of other infectious and parasitic diseases: Secondary | ICD-10-CM | POA: Diagnosis not present

## 2020-05-26 DIAGNOSIS — I10 Essential (primary) hypertension: Secondary | ICD-10-CM | POA: Insufficient documentation

## 2020-05-26 DIAGNOSIS — N430 Encysted hydrocele: Secondary | ICD-10-CM | POA: Insufficient documentation

## 2020-05-26 DIAGNOSIS — N320 Bladder-neck obstruction: Secondary | ICD-10-CM | POA: Insufficient documentation

## 2020-05-26 DIAGNOSIS — Z9049 Acquired absence of other specified parts of digestive tract: Secondary | ICD-10-CM | POA: Insufficient documentation

## 2020-05-26 DIAGNOSIS — N401 Enlarged prostate with lower urinary tract symptoms: Secondary | ICD-10-CM | POA: Diagnosis present

## 2020-05-26 HISTORY — PX: CYSTOSCOPY: SHX5120

## 2020-05-26 HISTORY — PX: EPIDIDYMECTOMY: SHX6275

## 2020-05-26 HISTORY — PX: HYDROCELE EXCISION: SHX482

## 2020-05-26 SURGERY — EPIDIDYMECTOMY
Anesthesia: General | Laterality: Right

## 2020-05-26 MED ORDER — FENTANYL CITRATE (PF) 100 MCG/2ML IJ SOLN
25.0000 ug | INTRAMUSCULAR | Status: DC | PRN
  Administered 2020-05-26 (×2): 50 ug via INTRAVENOUS

## 2020-05-26 MED ORDER — OXYCODONE HCL 5 MG/5ML PO SOLN: 5.0000 mg | Freq: Once | ORAL | Status: AC | PRN

## 2020-05-26 MED ORDER — PROPOFOL 10 MG/ML IV BOLUS
INTRAVENOUS | Status: DC | PRN
  Administered 2020-05-26: 200 mg via INTRAVENOUS

## 2020-05-26 MED ORDER — OXYCODONE HCL 5 MG PO TABS
5.0000 mg | ORAL_TABLET | Freq: Once | ORAL | Status: AC | PRN
  Administered 2020-05-26: 5 mg via ORAL

## 2020-05-26 MED ORDER — OXYCODONE HCL 5 MG PO TABS
ORAL_TABLET | ORAL | Status: AC
  Filled 2020-05-26: qty 2

## 2020-05-26 MED ORDER — FENTANYL CITRATE (PF) 100 MCG/2ML IJ SOLN
INTRAMUSCULAR | Status: DC | PRN
  Administered 2020-05-26 (×4): 25 ug via INTRAVENOUS

## 2020-05-26 MED ORDER — ORAL CARE MOUTH RINSE: 15.0000 mL | Freq: Once | OROMUCOSAL | Status: AC

## 2020-05-26 MED ORDER — ACETAMINOPHEN 160 MG/5ML PO SOLN: 1000.0000 mg | Freq: Once | ORAL | Status: DC | PRN

## 2020-05-26 MED ORDER — 0.9 % SODIUM CHLORIDE (POUR BTL) OPTIME
TOPICAL | Status: DC | PRN
  Administered 2020-05-26: 1000 mL

## 2020-05-26 MED ORDER — EPHEDRINE 5 MG/ML INJ
INTRAVENOUS | Status: AC
  Filled 2020-05-26: qty 10

## 2020-05-26 MED ORDER — PHENYLEPHRINE HCL (PRESSORS) 10 MG/ML IV SOLN
INTRAVENOUS | Status: AC
  Filled 2020-05-26: qty 1

## 2020-05-26 MED ORDER — LIDOCAINE 2% (20 MG/ML) 5 ML SYRINGE
INTRAMUSCULAR | Status: DC | PRN
  Administered 2020-05-26: 60 mg via INTRAVENOUS

## 2020-05-26 MED ORDER — FENTANYL CITRATE (PF) 100 MCG/2ML IJ SOLN
INTRAMUSCULAR | Status: AC
  Filled 2020-05-26: qty 2

## 2020-05-26 MED ORDER — ACETAMINOPHEN 10 MG/ML IV SOLN
1000.0000 mg | Freq: Once | INTRAVENOUS | Status: DC | PRN
  Administered 2020-05-26: 1000 mg via INTRAVENOUS

## 2020-05-26 MED ORDER — BUPIVACAINE HCL 0.25 % IJ SOLN
INTRAMUSCULAR | Status: AC
  Filled 2020-05-26: qty 1

## 2020-05-26 MED ORDER — SODIUM CHLORIDE 0.9% FLUSH: 3.0000 mL | Freq: Two times a day (BID) | INTRAVENOUS | Status: DC

## 2020-05-26 MED ORDER — ACETAMINOPHEN 500 MG PO TABS: 1000.0000 mg | ORAL_TABLET | Freq: Once | ORAL | Status: DC | PRN

## 2020-05-26 MED ORDER — OXYCODONE HCL 5 MG PO TABS
5.0000 mg | ORAL_TABLET | Freq: Once | ORAL | Status: AC
  Administered 2020-05-26: 5 mg via ORAL

## 2020-05-26 MED ORDER — ONDANSETRON HCL 4 MG/2ML IJ SOLN
INTRAMUSCULAR | Status: AC
  Filled 2020-05-26: qty 2

## 2020-05-26 MED ORDER — LIDOCAINE 2% (20 MG/ML) 5 ML SYRINGE
INTRAMUSCULAR | Status: AC
  Filled 2020-05-26: qty 5

## 2020-05-26 MED ORDER — PROPOFOL 10 MG/ML IV BOLUS
INTRAVENOUS | Status: AC
  Filled 2020-05-26: qty 20

## 2020-05-26 MED ORDER — ACETAMINOPHEN 10 MG/ML IV SOLN
INTRAVENOUS | Status: AC
  Filled 2020-05-26: qty 100

## 2020-05-26 MED ORDER — LACTATED RINGERS IV SOLN: INTRAVENOUS | Status: DC

## 2020-05-26 MED ORDER — GLYCOPYRROLATE PF 0.2 MG/ML IJ SOSY
PREFILLED_SYRINGE | INTRAMUSCULAR | Status: AC
  Filled 2020-05-26: qty 1

## 2020-05-26 MED ORDER — STERILE WATER FOR IRRIGATION IR SOLN
Status: DC | PRN
  Administered 2020-05-26: 3000 mL

## 2020-05-26 MED ORDER — EPHEDRINE SULFATE-NACL 50-0.9 MG/10ML-% IV SOSY
PREFILLED_SYRINGE | INTRAVENOUS | Status: DC | PRN
  Administered 2020-05-26: 10 mg via INTRAVENOUS
  Administered 2020-05-26: 15 mg via INTRAVENOUS
  Administered 2020-05-26: 10 mg via INTRAVENOUS

## 2020-05-26 MED ORDER — CEFAZOLIN SODIUM-DEXTROSE 2-4 GM/100ML-% IV SOLN
2.0000 g | INTRAVENOUS | Status: AC
  Administered 2020-05-26: 2 g via INTRAVENOUS
  Filled 2020-05-26: qty 100

## 2020-05-26 MED ORDER — CHLORHEXIDINE GLUCONATE 0.12 % MT SOLN
15.0000 mL | Freq: Once | OROMUCOSAL | Status: AC
  Administered 2020-05-26: 15 mL via OROMUCOSAL

## 2020-05-26 MED ORDER — BUPIVACAINE HCL 0.25 % IJ SOLN
INTRAMUSCULAR | Status: DC | PRN
  Administered 2020-05-26: 15 mL

## 2020-05-26 MED ORDER — PHENYLEPHRINE HCL-NACL 10-0.9 MG/250ML-% IV SOLN
INTRAVENOUS | Status: DC | PRN
  Administered 2020-05-26: 35 ug/min via INTRAVENOUS

## 2020-05-26 SURGICAL SUPPLY — 40 items
APL SKNCLS STERI-STRIP NONHPOA (GAUZE/BANDAGES/DRESSINGS)
BAG URO CATCHER STRL LF (MISCELLANEOUS) IMPLANT
BENZOIN TINCTURE PRP APPL 2/3 (GAUZE/BANDAGES/DRESSINGS) IMPLANT
BLADE HEX COATED 2.75 (ELECTRODE) ×5 IMPLANT
BNDG GAUZE ELAST 4 BULKY (GAUZE/BANDAGES/DRESSINGS) ×5 IMPLANT
CATH ROBINSON RED A/P 16FR (CATHETERS) ×5 IMPLANT
CLOTH BEACON ORANGE TIMEOUT ST (SAFETY) ×5 IMPLANT
COVER WAND RF STERILE (DRAPES) IMPLANT
DECANTER SPIKE VIAL GLASS SM (MISCELLANEOUS) ×5 IMPLANT
DRAIN PENROSE 0.25X18 (DRAIN) IMPLANT
DRAIN PENROSE 0.5X18 (DRAIN) IMPLANT
DRAPE LAPAROTOMY T 98X78 PEDS (DRAPES) ×5 IMPLANT
DRSG KUZMA FLUFF (GAUZE/BANDAGES/DRESSINGS) ×5 IMPLANT
ELECT REM PT RETURN 15FT ADLT (MISCELLANEOUS) ×5 IMPLANT
GAUZE SPONGE 4X4 12PLY STRL (GAUZE/BANDAGES/DRESSINGS) ×5 IMPLANT
GLOVE SURG SS PI 8.0 STRL IVOR (GLOVE) IMPLANT
GOWN STRL REUS W/TWL XL LVL3 (GOWN DISPOSABLE) ×10 IMPLANT
GUIDEWIRE STR DUAL SENSOR (WIRE) IMPLANT
KIT BASIN OR (CUSTOM PROCEDURE TRAY) ×5 IMPLANT
KIT TURNOVER KIT A (KITS) IMPLANT
MANIFOLD NEPTUNE II (INSTRUMENTS) ×5 IMPLANT
NEEDLE HYPO 22GX1.5 SAFETY (NEEDLE) ×5 IMPLANT
NS IRRIG 1000ML POUR BTL (IV SOLUTION) ×5 IMPLANT
PACK CYSTO (CUSTOM PROCEDURE TRAY) IMPLANT
PACK GENERAL/GYN (CUSTOM PROCEDURE TRAY) ×5 IMPLANT
SET CYSTO W/LG BORE CLAMP LF (SET/KITS/TRAYS/PACK) ×5 IMPLANT
SPONGE LAP 4X18 RFD (DISPOSABLE) IMPLANT
SUPPORT SCROTAL LG STRP (MISCELLANEOUS) ×4 IMPLANT
SUPPORTER ATHLETIC LG (MISCELLANEOUS) ×1
SUT CHROMIC 3 0 SH 27 (SUTURE) ×25 IMPLANT
SUT CHROMIC 4 0 SH 27 (SUTURE) IMPLANT
SUT VIC AB 3-0 SH 27 (SUTURE)
SUT VIC AB 3-0 SH 27XBRD (SUTURE) IMPLANT
SUT VICRYL 0 TIES 12 18 (SUTURE) ×5 IMPLANT
SYR CONTROL 10ML LL (SYRINGE) ×5 IMPLANT
TOWEL OR 17X26 10 PK STRL BLUE (TOWEL DISPOSABLE) ×5 IMPLANT
TUBING CONNECTING 10 (TUBING) IMPLANT
TUBING CONNECTING 10' (TUBING)
TUBING UROLOGY SET (TUBING) IMPLANT
WATER STERILE IRR 1000ML POUR (IV SOLUTION) IMPLANT

## 2020-05-26 NOTE — Interval H&P Note (Signed)
History and Physical Interval Note: No change  05/26/2020 7:15 AM  [name] [name]  has presented today for surgery, with the diagnosis of RIGHT EPIDIDYMAL PAIN LEFT HYDROCELE BENIGN PROSTATE HYPERPLASIA EIHT BLADDER OUTLET OBSTRUCTION.  The various methods of treatment have been discussed with the patient and family. After consideration of risks, benefits and other options for treatment, the patient has consented to  Procedure(s): RIGHT EPIDIDYMECTOMY (Right) LEFT HYDROCELECTOMY ADULT (Left) CYSTOSCOPY FLEXIBLE (N/A) as a surgical intervention.  The patient's history has been reviewed, patient examined, no change in status, stable for surgery.  I have reviewed the patient's chart and labs.  Questions were answered to the patient's satisfaction.     Irine Seal

## 2020-05-26 NOTE — Op Note (Signed)
Procedure: 1.  Flexible cystoscopy. 2.  Left hydrocelectomy. 3.  Right epididymectomy.  Preop diagnosis: 1.  BPH with lower urinary tract symptoms. 2.  Left hydrocele. 3.  Right epididymal pain.  Postop diagnosis: Same.  Surgeon: Dr. Irine Seal.  Anesthesia: General.  Drain: None.  EBL: 5 mL.  Specimen: Right epididymis.  Complications: None.  Indications: [name] [name] is a 74 year old male who has lower urinary tract symptoms and needs cystoscopy to evaluate his outlet.  He also has a left hydrocele which is symptomatic and right epididymal pain with some epididymal induration.  He is elected to proceed with hydrocelectomy and right epididymectomy.  Procedure: He was given preoperative antibiotic.  A general anesthetic was induced.  The scrotum was clipped and he was prepped with a Betadine solution and then draped in usual sterile fashion.  Flexible cystoscopy was performed.  The urethra was normal.  The external sphincter was intact.  The prostatic urethra was short with some right lateral lobe hyperplasia but no significant left lateral lobe hyperplasia there was some tightness at the bladder neck but there was no middle lobe or median bar.  Examination of bladder revealed mild to moderate trabeculation with cellules on the dome.  No mucosal lesions were identified.  Ureteral orifices were unremarkable.  The anterior scrotal median raphae was infiltrated with 5 mL of quarter percent Marcaine and a 4 to 5 cm incision was made with a knife.  This was carried down through the dartos fascia with the Bovie.  The left testicle was initially delivered within the tunica vaginalis.  The hydrocele sac was then opened and drained and then imbricated behind the testicle in a water bottle fashion.  There was insufficient sac to excise.  Once hemostasis was secured a cord block was performed with 3 to 4 mL of quarter percent Marcaine and the testicle was returned to the left hemiscrotum.  The  right testicle within the tunica vaginalis is was then delivered through the dartos fascia with the Bovie.  The tunica vaginalis was opened and the testicle was exposed.  The epididymis was excised using Bovie dissection.  The vascular pedicle was clamped and divided and ligated with a 0 Vicryl tie and a 3-0 chromic suture ligature.  The rete testis was oversewn with running 3-0 chromic.  The vas was divided and ligated with 0 Vicryl ties.  Once hemostasis was achieved a cord block was performed with 3 to 4 mL of quarter percent Marcaine and the testicle was returned to the right hemiscrotum.  Final hemostasis was secured and the wound was closed in 2 layers using a running 3-0 chromic on the dartos fascia including the median raphae and a running vertical mattress 3-0 chromic on the skin.  A dressing of 4 x 4's, fluff Kerlix and scrotal support was applied.  His anesthetic was reversed and he was moved to recovery in stable condition.  There were no complications.

## 2020-05-26 NOTE — Transfer of Care (Signed)
Immediate Anesthesia Transfer of Care Note  Patient: [name] [name]  Procedure(s) Performed: RIGHT EPIDIDYMECTOMY (Right ) LEFT HYDROCELECTOMY ADULT (Left ) CYSTOSCOPY FLEXIBLE (N/A )  Patient Location: PACU  Anesthesia Type:General  Level of Consciousness: awake, alert  and oriented  Airway & Oxygen Therapy: Patient Spontanous Breathing and Patient connected to face mask oxygen  Post-op Assessment: Report given to RN and Post -op Vital signs reviewed and stable  Post vital signs: Reviewed and stable  Last Vitals:  Vitals Value Taken Time  BP 149/73 05/26/20 0845  Temp 36.9 C 05/26/20 0845  Pulse 77 05/26/20 0846  Resp 21 05/26/20 0846  SpO2 98 % 05/26/20 0846  Vitals shown include unvalidated device data.  Last Pain:  Vitals:   05/26/20 0544  TempSrc:   PainSc: 0-No pain         Complications: No complications documented.

## 2020-05-26 NOTE — Anesthesia Preprocedure Evaluation (Addendum)
Anesthesia Evaluation  Patient identified by MRN, date of birth, ID band Patient awake    Reviewed: Allergy & Precautions, NPO status , Patient's Chart, lab work & pertinent test results  History of Anesthesia Complications Negative for: history of anesthetic complications  Airway Mallampati: II  TM Distance: >3 FB Neck ROM: Full    Dental  (+) Edentulous Upper, Edentulous Lower, Upper Dentures, Lower Dentures   Pulmonary neg sleep apnea, neg COPD, neg recent URI,    breath sounds clear to auscultation       Cardiovascular hypertension, (-) angina(-) Past MI and (-) CHF + dysrhythmias  Rhythm:Regular  LEFT VENTRICLE:  - Left ventricular size was normal.  - Overall left ventricular systolic function was normal.  - Left ventricular ejection fraction was estimated , range being 55     % to 60 %.  - Although no diagnostic left ventricular regional wall motion     abnormality was identified, this possibility cannot be     completely excluded on the basis of this study.  - Left ventricular wall thickness was normal.   Doppler interpretation(s):  - Features were consistent with mild diastolic dysfunction.   AORTIC VALVE:  - The aortic valve was probably trileaflet.  - Aortic valve thickness was mildly increased.  - There was normal aortic valve leaflet excursion.   Doppler interpretation(s):  - There was no significant aortic valvular regurgitation.   AORTA:  - The aortic root was normal in size.   MITRAL VALVE:  - The mitral valve was grossly normal.   Doppler interpretation(s):  - There was no significant mitral valvular regurgitation.   LEFT ATRIUM:  - Left atrial size was normal.   RIGHT VENTRICLE:  - The right ventricle was mildly dilated.  - Right ventricular systolic function was normal.  - There was possible right ventricular hypertrophy.   PULMONIC  VALVE:  - The pulmonic valve was not well visualized.   TRICUSPID VALVE:  - The tricuspid valve was grossly normal.   Doppler interpretation(s):  - There was trivial tricuspid valvular regurgitation.   PULMONARY ARTERY:  - The pulmonary artery morphology appeared normal.   Doppler interpretation(s):  - The estimated pulmonary artery systolic pressure was within the     normal range.   RIGHT ATRIUM:  - Right atrial size was normal.   PERICARDIUM:  - There was no pericardial effusion.   ---------------------------------------------------------------   SUMMARY  - Limited study due to poor acoustic windows.  - Overall left ventricular systolic function was normal. Left     ventricular ejection fraction was estimated , range being 55     % to 60 %. Although no diagnostic left ventricular regional     wall motion abnormality was identified, this possibility     cannot be completely excluded on the basis of this study.     Features were consistent with mild diastolic dysfunction.  - Aortic valve thickness was mildly increased. There was normal     aortic valve leaflet excursion.  - The right ventricle was mildly dilated. There was possible right     ventricular hypertrophy.  - The estimated pulmonary artery systolic pressure was within the     normal range.     Neuro/Psych  Headaches, neg Seizures PSYCHIATRIC DISORDERS Depression Patient denies tia    GI/Hepatic PUD, GERD  Medicated and Controlled,  Endo/Other  negative endocrine ROS  Renal/GU negative Renal ROS     Musculoskeletal  (+) Arthritis ,   Abdominal  Peds  Hematology  (+) Blood dyscrasia, anemia ,   Anesthesia Other Findings   Reproductive/Obstetrics                            Anesthesia Physical Anesthesia Plan  ASA: II  Anesthesia Plan: General   Post-op Pain Management:    Induction:  Intravenous  PONV Risk Score and Plan: 2 and Ondansetron  Airway Management Planned: LMA and Oral ETT  Additional Equipment: None  Intra-op Plan:   Post-operative Plan: Extubation in OR  Informed Consent: I have reviewed the patients History and Physical, chart, labs and discussed the procedure including the risks, benefits and alternatives for the proposed anesthesia with the patient or authorized representative who has indicated his/her understanding and acceptance.     Dental advisory given  Plan Discussed with: CRNA and Surgeon  Anesthesia Plan Comments:         Anesthesia Quick Evaluation

## 2020-05-26 NOTE — Anesthesia Procedure Notes (Signed)
Procedure Name: LMA Insertion Date/Time: 05/26/2020 7:34 AM Performed by: Maxwell Caul, CRNA Pre-anesthesia Checklist: Patient identified, Emergency Drugs available, Suction available and Patient being monitored Patient Re-evaluated:Patient Re-evaluated prior to induction Oxygen Delivery Method: Circle system utilized Preoxygenation: Pre-oxygenation with 100% oxygen Induction Type: IV induction LMA: LMA inserted LMA Size: 4.0 Number of attempts: 1 Placement Confirmation: positive ETCO2 and breath sounds checked- equal and bilateral Tube secured with: Tape Dental Injury: Teeth and Oropharynx as per pre-operative assessment

## 2020-05-26 NOTE — Discharge Instructions (Addendum)
Current Urology, 10(1), 1-14. https://[email or web address]">  Hydrocelectomy, Adult, Care After This sheet gives you information about how to care for yourself after your procedure. Your health care provider may also give you more specific instructions. If you have problems or questions, contact your health care provider. What can I expect after the procedure? After your procedure, it is common to have:  Mild discomfort and swelling in the pouch that holds your testicles (scrotum).  Bruising of the scrotum. Follow these instructions at home: Medicines  Take over-the-counter and prescription medicines only as told by your health care provider.  Ask your health care provider if the medicine prescribed to you: ? Requires you to avoid driving or using heavy machinery. ? Can cause constipation. You may need to take these actions to prevent or treat constipation:  Drink enough fluid to keep your urine pale yellow.  Take over-the-counter or prescription medicines.  Eat foods that are high in fiber, such as beans, whole grains, and fresh fruits and vegetables.  Limit foods that are high in fat and processed sugars, such as fried or sweet foods. Bathing  Do not take baths, swim, or use a hot tub until your health care provider approves. Ask your health care provider if you may take showers. You may only be allowed to take sponge baths.  If you were told to wear an athletic support strap (scrotal support), keep it dry. Take it off when you shower or bathe. Incision care   Follow instructions from your health care provider about how to take care of your incision. Make sure you: ? Wash your hands with soap and water before and after you change your bandage (dressing). If soap and water are not available, use hand sanitizer. ? Change your dressing as told by your health care provider. ? Leave stitches (sutures), skin glue, or adhesive strips in place. These skin closures may need to stay  in place for 2 weeks or longer. If adhesive strip edges start to loosen and curl up, you may trim the loose edges. Do not remove adhesive strips completely unless your health care provider tells you to do that.  Check your incision and scrotum every day for signs of infection. Check for: ? More redness, swelling, or pain. ? Fluid or blood. ? Warmth. ? Pus or a bad smell. Managing pain and swelling If directed, put ice on the affected area. To do this:  Put ice in a plastic bag.  Place a towel between your skin and the bag.  Leave the ice on for 20 minutes, 2-3 times per day.  Activity  Do not do any high-energy activities for as long as told by your health care provider.  Do not lift anything that is heavier than 10 lb (4.5 kg), or the limit that you are told, until your health care provider says that it is safe.  Return to your normal activities as told by your health care provider. Ask your health care provider what activities are safe for you.  Do not drive for 24 hours if you were given a sedative during your procedure.  Ask your health care provider when it is safe to drive. General instructions  Do not use any products that contain nicotine or tobacco, such as cigarettes, e-cigarettes, and chewing tobacco. These can delay incision healing after surgery. If you need help quitting, ask your health care provider.  If you were given a scrotal support, wear it as told by your health care provider.  If you   had a drain put in during the procedure, you will need to have it removed at a follow-up visit.  Keep all follow-up visits as told by your health care provider. This is important. Contact a health care provider if:  Your pain is not controlled with medicine.  You have more redness, swelling, or pain around your scrotum.  You have fluid or blood coming from your incision.  Your incision feels warm to the touch.  You have pus or a bad smell coming from your  scrotum.  You have a fever. Get help right away if:  You develop shaking, chills, and a fever that is higher than 101.67F (38.8C).  You have redness or swelling that starts at your scrotum and spreads outward to cover your whole groin.  You develop swelling of the legs or difficulty breathing. Summary  After a hydrocelectomy, it is common to have mild discomfort, swelling, and bruising.  Do not take baths, swim, or use a hot tub until your health care provider approves. Ask your health care provider if you may take showers.  If directed, put ice on the affected area to help with pain and swelling.  Do not do any high-energy activities or lift anything heavier than 10 lb (4.5 kg) for as long as told by your health care provider.  If you were given a scrotal support, keep it dry. Wear the scrotal support as told by your health care provider. This information is not intended to replace advice given to you by your health care provider. Make sure you discuss any questions you have with your health care provider. Document Revised: 02/19/2019 Document Reviewed: 02/19/2019 Elsevier Patient Education  2020 [place] Anesthesia, Adult, Care After This sheet gives you information about how to care for yourself after your procedure. Your health care provider may also give you more specific instructions. If you have problems or questions, contact your health care provider. What can I expect after the procedure? After the procedure, the following side effects are common:  Pain or discomfort at the IV site.  Nausea.  Vomiting.  Sore throat.  Trouble concentrating.  Feeling cold or chills.  Weak or tired.  Sleepiness and fatigue.  Soreness and body aches. These side effects can affect parts of the body that were not involved in surgery. Follow these instructions at home:  For at least 24 hours after the procedure:  Have a responsible adult stay with you. It is  important to have someone help care for you until you are awake and alert.  Rest as needed.  Do not: ? Participate in activities in which you could fall or become injured. ? Drive. ? Use heavy machinery. ? Drink alcohol. ? Take sleeping pills or medicines that cause drowsiness. ? Make important decisions or sign legal documents. ? Take care of children on your own. Eating and drinking  Follow any instructions from your health care provider about eating or drinking restrictions.  When you feel hungry, start by eating small amounts of foods that are soft and easy to digest (bland), such as toast. Gradually return to your regular diet.  Drink enough fluid to keep your urine pale yellow.  If you vomit, rehydrate by drinking water, juice, or clear broth. General instructions  If you have sleep apnea, surgery and certain medicines can increase your risk for breathing problems. Follow instructions from your health care provider about wearing your sleep device: ? Anytime you are sleeping, including during daytime naps. ?  While taking prescription pain medicines, sleeping medicines, or medicines that make you drowsy.  Return to your normal activities as told by your health care provider. Ask your health care provider what activities are safe for you.  Take over-the-counter and prescription medicines only as told by your health care provider.  If you smoke, do not smoke without supervision.  Keep all follow-up visits as told by your health care provider. This is important. Contact a health care provider if:  You have nausea or vomiting that does not get better with medicine.  You cannot eat or drink without vomiting.  You have pain that does not get better with medicine.  You are unable to pass urine.  You develop a skin rash.  You have a fever.  You have redness around your IV site that gets worse. Get help right away if:  You have difficulty breathing.  You have chest  pain.  You have blood in your urine or stool, or you vomit blood. Summary  After the procedure, it is common to have a sore throat or nausea. It is also common to feel tired.  Have a responsible adult stay with you for the first 24 hours after general anesthesia. It is important to have someone help care for you until you are awake and alert.  When you feel hungry, start by eating small amounts of foods that are soft and easy to digest (bland), such as toast. Gradually return to your regular diet.  Drink enough fluid to keep your urine pale yellow.  Return to your normal activities as told by your health care provider. Ask your health care provider what activities are safe for you. This information is not intended to replace advice given to you by your health care provider. Make sure you discuss any questions you have with your health care provider. Document Revised: 09/29/2017 Document Reviewed: 05/12/2017 Elsevier Patient Education  [place].

## 2020-05-27 ENCOUNTER — Encounter (HOSPITAL_COMMUNITY): Payer: Self-pay | Admitting: Urology

## 2020-05-27 LAB — SURGICAL PATHOLOGY

## 2020-05-28 NOTE — Anesthesia Postprocedure Evaluation (Signed)
Anesthesia Post Note  Patient: JUSTUN ANAYA  Procedure(s) Performed: RIGHT EPIDIDYMECTOMY (Right ) LEFT HYDROCELECTOMY ADULT (Left ) CYSTOSCOPY FLEXIBLE (N/A )     Patient location during evaluation: PACU Anesthesia Type: General Level of consciousness: awake and alert Pain management: pain level controlled Vital Signs Assessment: post-procedure vital signs reviewed and stable Respiratory status: spontaneous breathing, nonlabored ventilation, respiratory function stable and patient connected to nasal cannula oxygen Cardiovascular status: blood pressure returned to baseline and stable Postop Assessment: no apparent nausea or vomiting Anesthetic complications: no   No complications documented.  Last Vitals:  Vitals:   05/26/20 0930 05/26/20 0940  BP: 137/80 113/74  Pulse: 70 63  Resp: 20 16  Temp: 36.8 C 36.8 C  SpO2: 91% 93%    Last Pain:  Vitals:   05/27/20 0909  TempSrc:   PainSc: 2                  Janoah Menna

## 2020-06-04 ENCOUNTER — Telehealth: Payer: Self-pay

## 2020-06-04 NOTE — Telephone Encounter (Signed)
He is already on Gabapentin,  Oxycodone 30mg  every 4hrs and MS contin 15mg  bid.   He is almost 10 days out from the surgical procedure and beyond the point where narcotics should be necessary for the procedure.  He could use Ibuprofen up to 800mg  (4 OTC tabs)  po tid and/or tylenol, but I do not feel comfortable adding to his already prodigious amount of narcotic.

## 2020-06-04 NOTE — Telephone Encounter (Signed)
Pt called and made aware

## 2020-06-18 NOTE — Progress Notes (Signed)
Subjective: 1. Encysted hydrocele     [name] [name] returns today in f/u from a left hydrocelectomy, right epdidymectomy and cystoscopy on 05/26/20.  He is doing well but has some residual pain and swelling.  He has used his routine pain meds for breakthrough pain and is out 2 days prior to his date for refill.  GU Hx: [name] [name] is a 74 yo male sent for evaluation of right > left testicular pain and left scrotal swelling.  I saw him in 5/20 for a left hydrocele that had been confirmed by [place].  A repeat US prior to this visit shows a stable left hydrocele.  He was given keflex at his last visit and that didn't help the symptoms.   He has a history of BPH with BOO and is on tamsulosin but it doesn't seem to help.  He has a reduced stream and nocturia 4-5x.   He has had no hematuria or dysuria.  He has had no stones or UTI's.  He has had no GU surgery.   He takes Ibuprofen up to 82m TID for his back pain but he is trying to cut that out because of liver issues.  He has occasional sciatica.  He has spinal stenosis.   He had a CT AP in 2016.   He is on chronic narcotics for back pain.  He had a left hip replacement in 11/12.    IPSS    Row Name 06/19/20 1000         International Prostate Symptom Score   How often have you had the sensation of not emptying your bladder? Less than half the time     How often have you had to urinate less than every two hours? Less than half the time     How often have you found you stopped and started again several times when you urinated? Not at All     How often have you found it difficult to postpone urination? Not at All     How often have you had a weak urinary stream? About half the time     How often have you had to strain to start urination? Not at All     How many times did you typically get up at night to urinate? 4 Times     Total IPSS Score 11       Quality of Life due to urinary symptoms   If you were to spend the rest of your life with your urinary  condition just the way it is now how would you feel about that? 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   Medial epicondylitis    Nephrolithiasis    Neuropathy    Pneumonia 2017   RBBB 07/19/2016   noted on EKG   Skin cancer    20 removed   Spinal stenosis     Past Surgical History:  Procedure Laterality Date   APPENDECTOMY     CARDIAC CATHETERIZATION  2009   CHOLECYSTECTOMY     CYSTOSCOPY N/A 05/26/2020   Procedure: CYSTOSCOPY FLEXIBLE;  Surgeon: Irine Seal, MD;  Location: [facility];  Service: Urology;  Laterality: N/A;   EPIDIDYMECTOMY Right 05/26/2020   Procedure: RIGHT EPIDIDYMECTOMY;  Surgeon: Irine Seal, MD;  Location: [facility];  Service: Urology;  Laterality: Right;   HEMORROIDECTOMY     HYDROCELE EXCISION Left 05/26/2020   Procedure: LEFT HYDROCELECTOMY ADULT;  Surgeon: Irine Seal, MD;  Location: [facility];  Service: Urology;  Laterality: Left;   I & D EXTREMITY  04/14/2012   Procedure: IRRIGATION AND DEBRIDEMENT EXTREMITY;  Surgeon: Johnny Bridge, MD;  Location: [facility];  Service: Orthopedics;  Laterality: Right;  I & D Hip   MULTIPLE TOOTH EXTRACTIONS     TOTAL HIP ARTHROPLASTY Right 11/26/2012   Procedure: TOTAL HIP ARTHROPLASTY;   Surgeon: Johnny Bridge, MD;  Location: [place];  Service: Orthopedics;  Laterality: Right;   TOTAL HIP ARTHROPLASTY Left 08/22/2019   Procedure: TOTAL HIP ARTHROPLASTY ANTERIOR APPROACH;  Surgeon: Rod Can, MD;  Location: [facility];  Service: Orthopedics;  Laterality: Left;    Social History   Socioeconomic History   Marital status: Married    Spouse name: Not on file   Number of children: 1   Years of education: Not on file   Highest education level: Not on file  Occupational History   Occupation: retired  Tobacco Use   Smoking status: Never Smoker   Smokeless tobacco: Never Used  Scientific laboratory technician Use: Never used  Substance and Sexual Activity   Alcohol use: No    Comment: Rare   Drug use: Not Currently    Types: "Crack" cocaine    Comment: age 41 did cocain. None since then   Sexual activity: Never    Birth control/protection: None  Other Topics Concern   Not on file  Social History Narrative   Not on file   Social Determinants of Health   Financial Resource Strain:    Difficulty of Paying Living Expenses: Not on file  Food Insecurity:    Worried About Charity fundraiser in the Last Year: Not on file   [employer] of Food in the Last Year: Not on file  Transportation Needs:    Lack of Transportation (Medical): Not on file   Lack of Transportation (Non-Medical): Not on file  Physical Activity:    Days of Exercise per Week: Not on file   Minutes of Exercise per Session: Not on file  Stress:    Feeling of Stress : Not on file  Social Connections:    Frequency of Communication with Friends and Family: Not on file   Frequency of Social Gatherings with Friends and Family: Not on file   Attends Religious Services: Not on file   Active Member of Clubs or Organizations: Not on file   Attends Archivist Meetings: Not on file   Marital Status: Not on file  Intimate Partner Violence:    Fear of Current or Ex-Partner: Not on file    Emotionally  Abused: Not on file   Physically Abused: Not on file   Sexually Abused: Not on file    Family History  Problem Relation Age of Onset   Heart disease Father    Prostate cancer Father     Anti-infectives: Anti-infectives (From admission, onward)   None      Current Outpatient Medications  Medication Sig Dispense Refill   bisacodyl (DULCOLAX) 5 MG EC tablet Take 5 mg by mouth daily.     Cholecalciferol (VITAMIN D3) 125 MCG (5000 UT) CAPS Take 5,000 Units by mouth daily.     gabapentin (NEURONTIN) 300 MG capsule Take 300 mg by mouth 3 (three) times daily.     milk thistle 175 MG tablet Take 175 mg by mouth in the morning and at bedtime.     morphine (MS CONTIN) 15 MG 12 hr tablet Take 15 mg by mouth every 12 (twelve) hours.      naloxone (NARCAN) 4 MG/0.1ML LIQD nasal spray kit Place 1 spray into the nose once.      oxycodone (ROXICODONE) 30 MG immediate release tablet Take 30 mg by mouth every 4 (four) hours as needed for pain.     tamsulosin (FLOMAX) 0.4 MG CAPS capsule Take 0.4 mg by mouth at bedtime.      traZODone (DESYREL) 100 MG tablet Take 100 mg by mouth at bedtime.      HYDROmorphone (DILAUDID) 2 MG tablet Take 1 tablet (2 mg total) by mouth every 4 (four) hours as needed for severe pain. 12 tablet 0   omeprazole (PRILOSEC) 20 MG capsule Take 20 mg by mouth daily. (Patient not taking: Reported on 06/19/2020)     No current facility-administered medications for this visit.     Objective: Vital signs in last 24 hours: BP (!) 151/78    Pulse 64    Temp 98 F (36.7 C)    Ht 5' 8"  (1.727 m)    Wt 208 lb (94.3 kg)    BMI 31.63 kg/m   Intake/Output from previous day: No intake/output data recorded. Intake/Output this shift: @IOTHISSHIFT @   Physical Exam Genitourinary:    Comments: Scrotal incision has healed.  There is the expected induration and residual tenderness with mild swelling bilaterally.      Lab Results:  Results for orders  placed or performed in visit on 06/19/20 (from the past 24 hour(s))  POCT urinalysis dipstick     Status: None   Collection Time: 06/19/20  9:36 AM  Result Value Ref Range   Color, UA yellow    Clarity, UA clear    Glucose, UA Negative Negative   Bilirubin, UA negtive    Ketones, UA negative    Spec Grav, UA     Blood, UA negative    pH, UA 5.0 5.0 - 8.0   Protein, UA Negative Negative   Urobilinogen, UA 0.2 0.2 or 1.0 E.U./dL   Nitrite, UA negative    Leukocytes, UA Negative Negative   Appearance     Odor        Studies/Results:    Assessment/Plan: Left hydrocele and right epididymal inflammation.  He is doing well s/p hydrocelectomy and right epididymectomy but he continues to have pain and is out of his usual scripts.   I have given him a script for 12 hydromorphone for the residual pain but will not prescribe anymore.    Meds ordered this encounter  Medications   DISCONTD: HYDROmorphone (DILAUDID) 4 MG tablet    Sig: Take  1 tablet (4 mg total) by mouth every 4 (four) hours as needed for severe pain.    Dispense:  12 tablet    Refill:  0   HYDROmorphone (DILAUDID) 2 MG tablet    Sig: Take 1 tablet (2 mg total) by mouth every 4 (four) hours as needed for severe pain.    Dispense:  12 tablet    Refill:  0     Orders Placed This Encounter  Procedures   POCT urinalysis dipstick     Return in about 3 months (around 09/18/2020).    CC: Sharlyne Cai NP.      Irine Seal 06/19/2020 [phone]

## 2020-06-19 ENCOUNTER — Ambulatory Visit (INDEPENDENT_AMBULATORY_CARE_PROVIDER_SITE_OTHER): Payer: Medicare Other | Admitting: Urology

## 2020-06-19 ENCOUNTER — Encounter: Payer: Self-pay | Admitting: Urology

## 2020-06-19 ENCOUNTER — Other Ambulatory Visit: Payer: Self-pay

## 2020-06-19 VITALS — BP 151/78 | HR 64 | Temp 98.0°F | Ht 68.0 in | Wt 208.0 lb

## 2020-06-19 DIAGNOSIS — N430 Encysted hydrocele: Secondary | ICD-10-CM

## 2020-06-19 LAB — POCT URINALYSIS DIPSTICK
Bilirubin, UA: NEGATIVE
Blood, UA: NEGATIVE
Glucose, UA: NEGATIVE
Ketones, UA: NEGATIVE
Leukocytes, UA: NEGATIVE
Nitrite, UA: NEGATIVE
Protein, UA: NEGATIVE
Urobilinogen, UA: 0.2 E.U./dL
pH, UA: 5 (ref 5.0–8.0)

## 2020-06-19 MED ORDER — HYDROMORPHONE HCL 2 MG PO TABS: 2.0000 mg | ORAL_TABLET | ORAL | 0 refills | Status: AC | PRN

## 2020-06-19 MED ORDER — HYDROMORPHONE HCL 4 MG PO TABS: 4.0000 mg | ORAL_TABLET | ORAL | 0 refills | Status: DC | PRN

## 2020-06-19 NOTE — Progress Notes (Signed)
Urological Symptom Review  Patient is experiencing the following symptoms: Get up at night to urinate Stream starts and stops Weak stream   Review of Systems  Gastrointestinal (upper)  : Indigestion/heartburn  Gastrointestinal (lower) : Constipation  Constitutional : Negative for symptoms  Skin: Negative for skin symptoms  Eyes: Negative for eye symptoms  Ear/Nose/Throat : Negative for Ear/Nose/Throat symptoms  Hematologic/Lymphatic: Easy bruising  Cardiovascular : Leg swelling  Respiratory : Negative for respiratory symptoms  Endocrine: Negative for endocrine symptoms  Musculoskeletal: Back pain Joint pain  Neurological: Negative for neurological symptoms  Psychologic: Negative for psychiatric symptoms

## 2020-09-07 ENCOUNTER — Encounter (INDEPENDENT_AMBULATORY_CARE_PROVIDER_SITE_OTHER): Payer: Self-pay | Admitting: *Deleted

## 2020-09-18 ENCOUNTER — Ambulatory Visit: Payer: Medicare Other | Admitting: Urology

## 2020-10-10 DEATH — deceased

## 2020-10-26 IMAGING — RF DG C-ARM 1-60 MIN-NO REPORT
1 series · 2 of 2 positions shown · non-contrast
Comparison: None.

CLINICAL DATA: Intraoperative imaging provided for left total hip
arthroplasty.

EXAM:
OPERATIVE LEFT HIP (WITH PELVIS IF PERFORMED) to VIEWS
TECHNIQUE: Fluoroscopic spot image(s) were submitted for interpretation
post-operatively.

[Series 1: unknown protocol · 0.20mm/px · 2 of 2 slices shown]
[im 1/2]
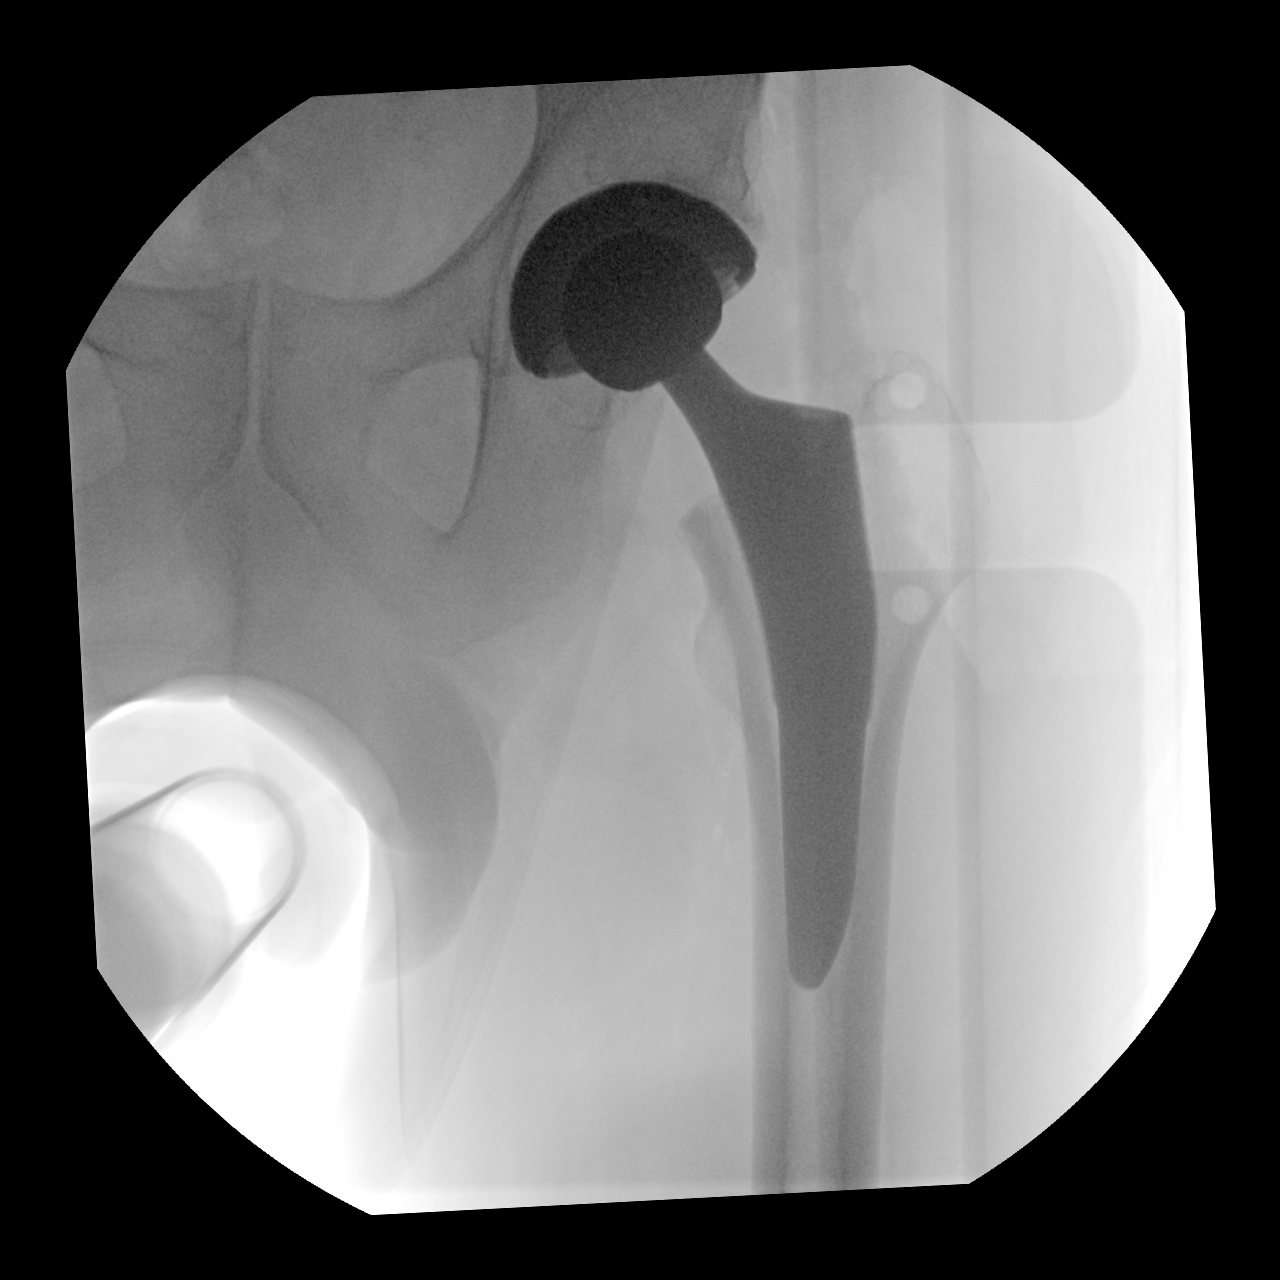
[im 2/2]
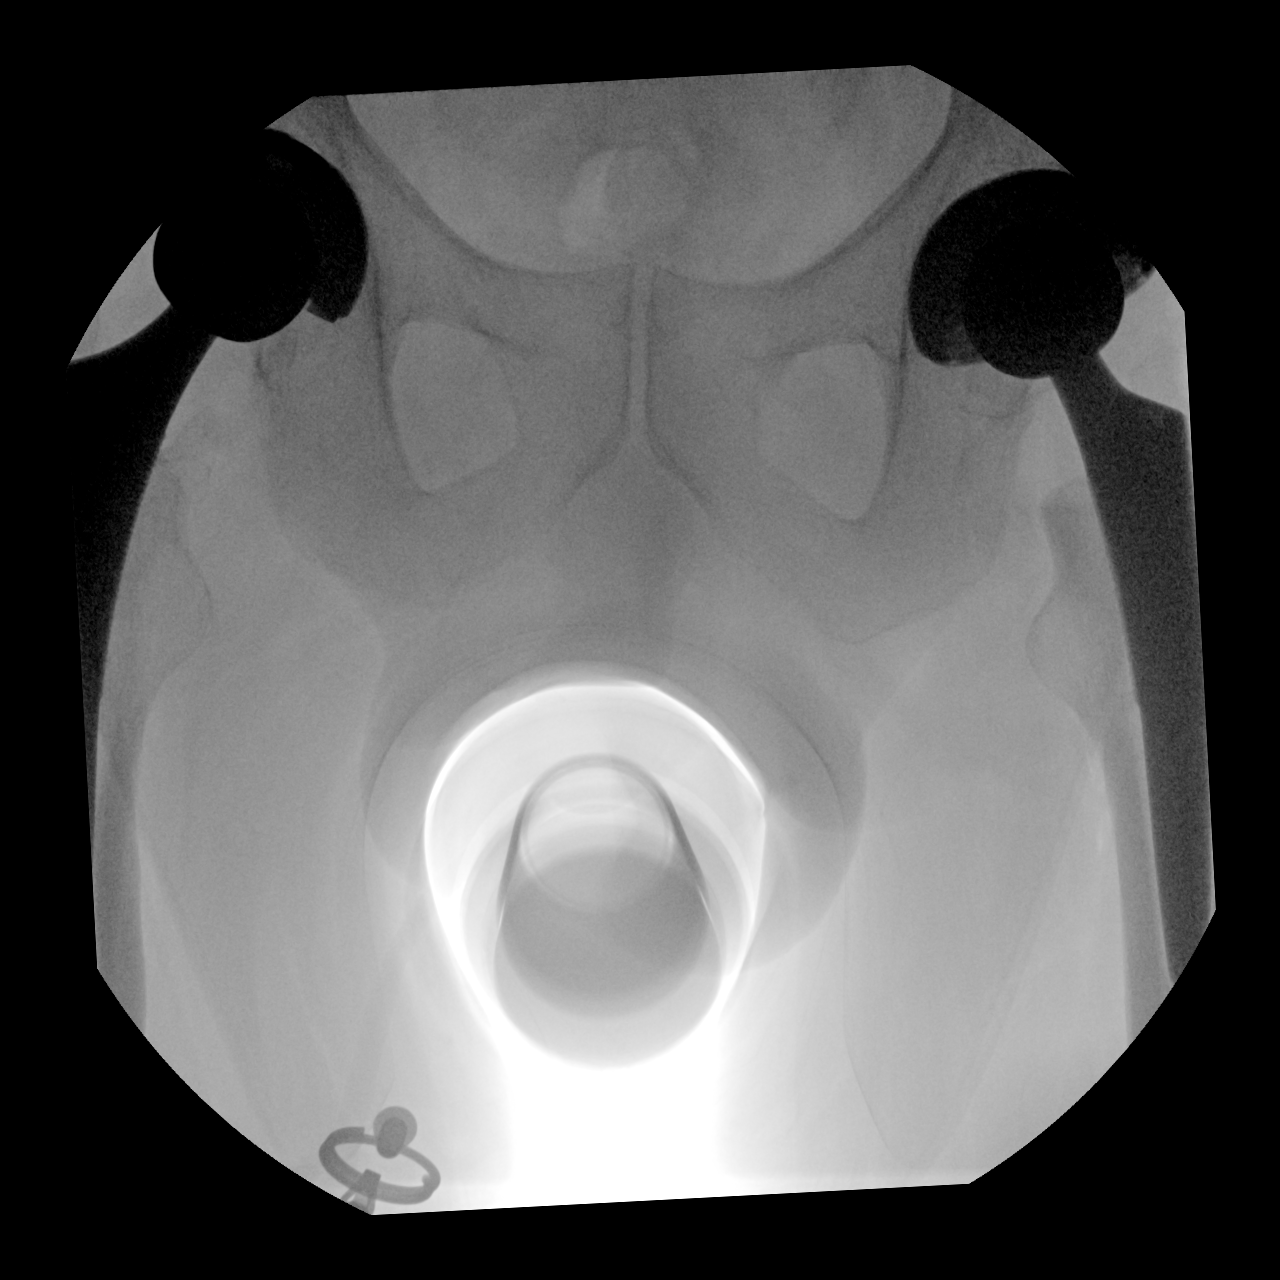

[2 of 2 positions shown; findings below may reference images not displayed]

FINDINGS: Two spot fluoro graphic images show a total left hip arthroplasty.
The femoral and acetabular components appear well seated and
aligned. No evidence of an operative complication.
IMPRESSION: Portable imaging for left hip total arthroplasty.

## 2020-10-26 IMAGING — DX DG PORTABLE PELVIS
1 series · 1 of 1 positions shown · non-contrast
Comparison: Radiograph dated 11/26/2012

CLINICAL DATA: Avascular necrosis of the left hip. Status post left
total hip arthroplasty.

EXAM:
PORTABLE PELVIS 1-2 VIEWS

[pelvis ap]
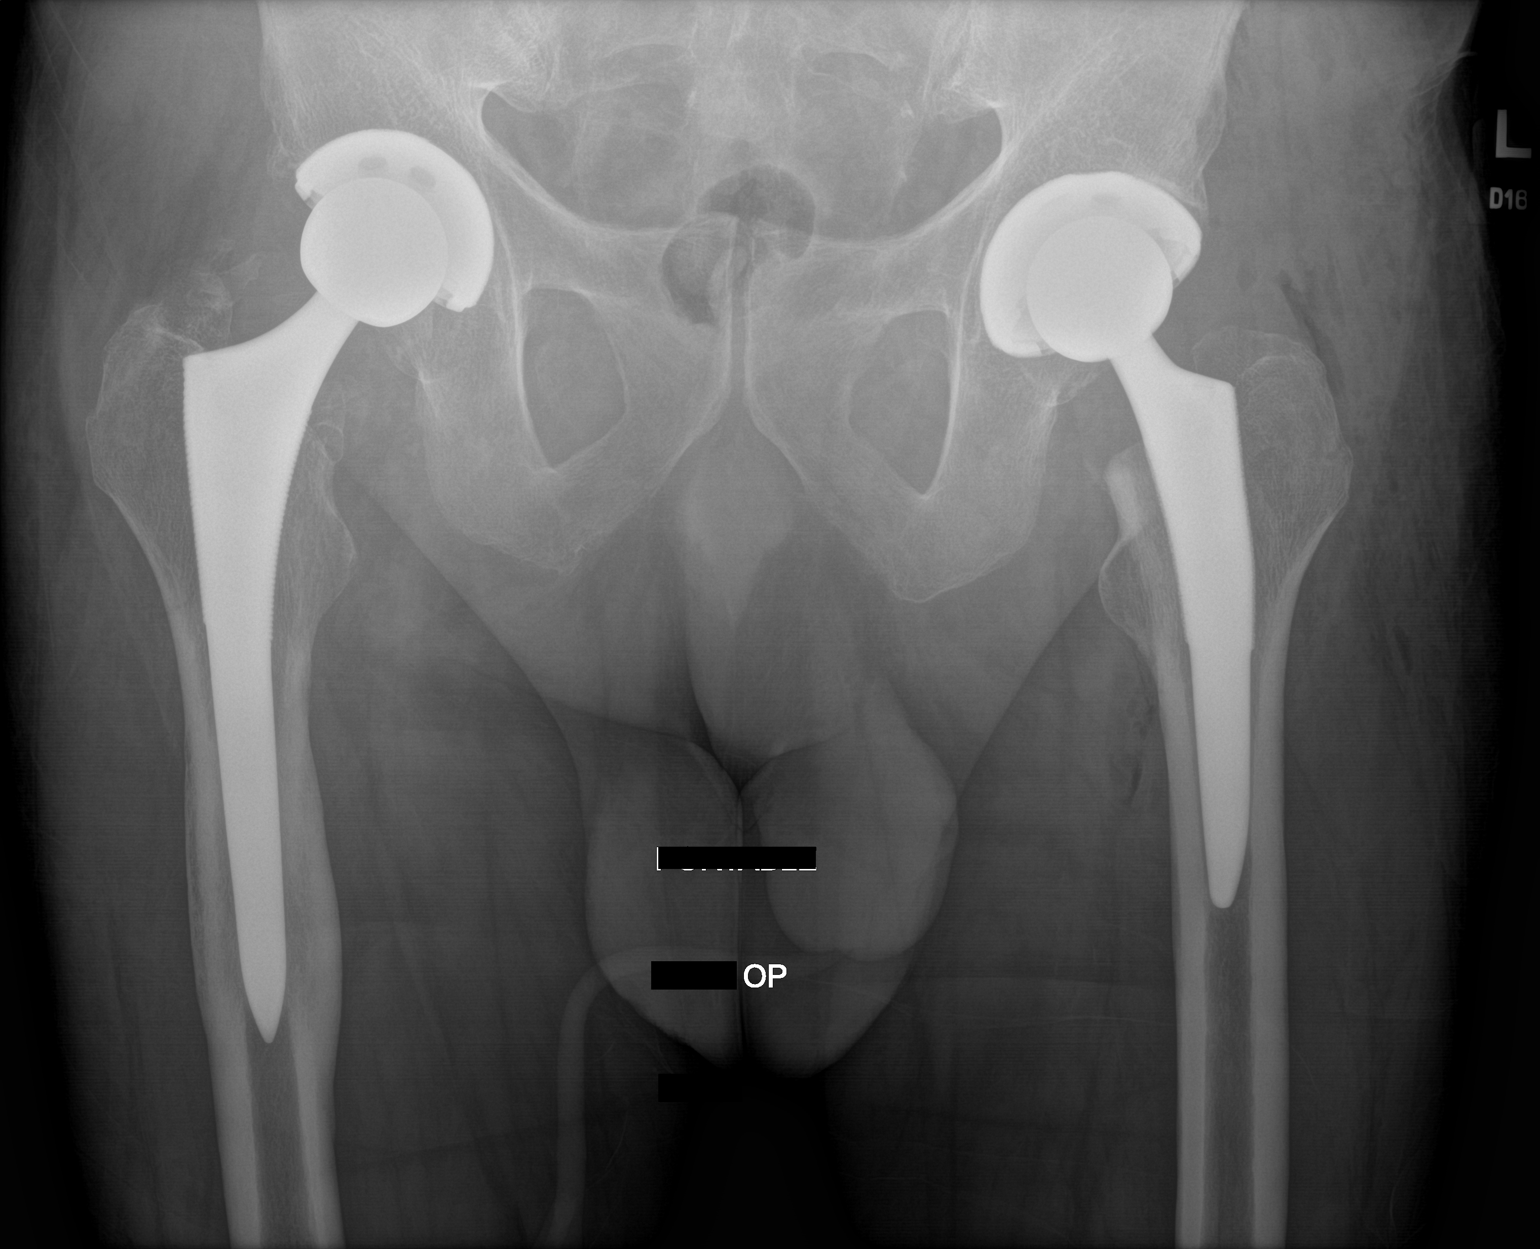

[1 of 1 positions shown; findings below may reference images not displayed]

FINDINGS: Single AP view of the pelvis demonstrates that the acetabular and
femoral components of the new left total hip prosthesis are in
excellent position in the AP projection. No fractures. Expected
postsurgical air in the adjacent soft tissues. Right total hip
prosthesis in place with no evidence of loosening.
IMPRESSION: Satisfactory appearance of the new left total hip prosthesis.

## 2020-12-10 ENCOUNTER — Ambulatory Visit (INDEPENDENT_AMBULATORY_CARE_PROVIDER_SITE_OTHER): Payer: Medicare Other | Admitting: Gastroenterology
